# Patient Record
Sex: Male | Born: 1935 | Race: White | Hispanic: No | Smoking: Former smoker
Health system: Southern US, Community
[De-identification: ages and names within clinical notes are randomized; demographics above are authoritative.]

## PROBLEM LIST (undated history)

## (undated) DIAGNOSIS — IMO0001 Reserved for inherently not codable concepts without codable children: Secondary | ICD-10-CM

## (undated) DIAGNOSIS — F419 Anxiety disorder, unspecified: Secondary | ICD-10-CM

## (undated) DIAGNOSIS — C3490 Malignant neoplasm of unspecified part of unspecified bronchus or lung: Secondary | ICD-10-CM

## (undated) DIAGNOSIS — R609 Edema, unspecified: Secondary | ICD-10-CM

## (undated) DIAGNOSIS — G8929 Other chronic pain: Secondary | ICD-10-CM

## (undated) DIAGNOSIS — F32A Depression, unspecified: Secondary | ICD-10-CM

## (undated) DIAGNOSIS — J449 Chronic obstructive pulmonary disease, unspecified: Secondary | ICD-10-CM

## (undated) DIAGNOSIS — G250 Essential tremor: Secondary | ICD-10-CM

## (undated) DIAGNOSIS — J8410 Pulmonary fibrosis, unspecified: Secondary | ICD-10-CM

## (undated) DIAGNOSIS — C61 Malignant neoplasm of prostate: Secondary | ICD-10-CM

## (undated) DIAGNOSIS — E119 Type 2 diabetes mellitus without complications: Secondary | ICD-10-CM

## (undated) DIAGNOSIS — E785 Hyperlipidemia, unspecified: Secondary | ICD-10-CM

## (undated) DIAGNOSIS — D126 Benign neoplasm of colon, unspecified: Secondary | ICD-10-CM

## (undated) DIAGNOSIS — N200 Calculus of kidney: Secondary | ICD-10-CM

## (undated) DIAGNOSIS — N35919 Unspecified urethral stricture, male, unspecified site: Secondary | ICD-10-CM

## (undated) DIAGNOSIS — Z923 Personal history of irradiation: Secondary | ICD-10-CM

## (undated) DIAGNOSIS — F329 Major depressive disorder, single episode, unspecified: Secondary | ICD-10-CM

## (undated) DIAGNOSIS — IMO0002 Reserved for concepts with insufficient information to code with codable children: Secondary | ICD-10-CM

## (undated) DIAGNOSIS — L03115 Cellulitis of right lower limb: Secondary | ICD-10-CM

## (undated) DIAGNOSIS — M25512 Pain in left shoulder: Secondary | ICD-10-CM

## (undated) DIAGNOSIS — K5790 Diverticulosis of intestine, part unspecified, without perforation or abscess without bleeding: Secondary | ICD-10-CM

## (undated) DIAGNOSIS — D649 Anemia, unspecified: Secondary | ICD-10-CM

## (undated) DIAGNOSIS — K219 Gastro-esophageal reflux disease without esophagitis: Secondary | ICD-10-CM

## (undated) DIAGNOSIS — K5909 Other constipation: Secondary | ICD-10-CM

## (undated) DIAGNOSIS — K225 Diverticulum of esophagus, acquired: Secondary | ICD-10-CM

## (undated) HISTORY — DX: Edema, unspecified: R60.9

## (undated) HISTORY — DX: Diverticulum of esophagus, acquired: K22.5

## (undated) HISTORY — PX: CATARACT EXTRACTION, BILATERAL: SHX1313

## (undated) HISTORY — DX: Depression, unspecified: F32.A

## (undated) HISTORY — DX: Anxiety disorder, unspecified: F41.9

## (undated) HISTORY — DX: Malignant neoplasm of unspecified part of unspecified bronchus or lung: C34.90

## (undated) HISTORY — DX: Hyperlipidemia, unspecified: E78.5

## (undated) HISTORY — DX: Calculus of kidney: N20.0

## (undated) HISTORY — DX: Cellulitis of right lower limb: L03.115

## (undated) HISTORY — PX: APPENDECTOMY: SHX54

## (undated) HISTORY — DX: Other chronic pain: G89.29

## (undated) HISTORY — DX: Personal history of irradiation: Z92.3

## (undated) HISTORY — DX: Type 2 diabetes mellitus without complications: E11.9

## (undated) HISTORY — DX: Other constipation: K59.09

## (undated) HISTORY — DX: Essential tremor: G25.0

## (undated) HISTORY — DX: Malignant neoplasm of prostate: C61

## (undated) HISTORY — DX: Unspecified urethral stricture, male, unspecified site: N35.919

## (undated) HISTORY — DX: Anemia, unspecified: D64.9

## (undated) HISTORY — DX: Major depressive disorder, single episode, unspecified: F32.9

## (undated) HISTORY — DX: Pulmonary fibrosis, unspecified: J84.10

## (undated) HISTORY — DX: Benign neoplasm of colon, unspecified: D12.6

## (undated) HISTORY — DX: Pain in left shoulder: M25.512

## (undated) HISTORY — DX: Chronic obstructive pulmonary disease, unspecified: J44.9

## (undated) HISTORY — DX: Diverticulosis of intestine, part unspecified, without perforation or abscess without bleeding: K57.90

## (undated) HISTORY — DX: Gastro-esophageal reflux disease without esophagitis: K21.9

---
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
## 2003-08-29 DIAGNOSIS — C61 Malignant neoplasm of prostate: Secondary | ICD-10-CM

## 2003-08-29 HISTORY — PX: OTHER SURGICAL HISTORY: SHX169

## 2003-08-29 HISTORY — DX: Malignant neoplasm of prostate: C61

## 2005-02-23 ENCOUNTER — Encounter: Admission: RE | Admit: 2005-02-23 | Discharge: 2005-02-23 | Payer: Self-pay | Admitting: Urology

## 2005-03-02 ENCOUNTER — Ambulatory Visit (HOSPITAL_COMMUNITY): Admission: RE | Admit: 2005-03-02 | Discharge: 2005-03-02 | Payer: Self-pay | Admitting: Urology

## 2005-03-02 ENCOUNTER — Ambulatory Visit (HOSPITAL_BASED_OUTPATIENT_CLINIC_OR_DEPARTMENT_OTHER): Admission: RE | Admit: 2005-03-02 | Discharge: 2005-03-02 | Payer: Self-pay | Admitting: Urology

## 2006-03-09 ENCOUNTER — Emergency Department (HOSPITAL_COMMUNITY): Admission: EM | Admit: 2006-03-09 | Discharge: 2006-03-09 | Payer: Self-pay | Admitting: Emergency Medicine

## 2006-03-10 ENCOUNTER — Emergency Department (HOSPITAL_COMMUNITY): Admission: EM | Admit: 2006-03-10 | Discharge: 2006-03-10 | Payer: Self-pay | Admitting: Emergency Medicine

## 2006-09-10 ENCOUNTER — Emergency Department (HOSPITAL_COMMUNITY): Admission: EM | Admit: 2006-09-10 | Discharge: 2006-09-10 | Payer: Self-pay | Admitting: Emergency Medicine

## 2006-09-11 ENCOUNTER — Emergency Department (HOSPITAL_COMMUNITY): Admission: EM | Admit: 2006-09-11 | Discharge: 2006-09-11 | Payer: Self-pay | Admitting: Emergency Medicine

## 2006-11-19 ENCOUNTER — Inpatient Hospital Stay (HOSPITAL_COMMUNITY): Admission: EM | Admit: 2006-11-19 | Discharge: 2006-11-23 | Payer: Self-pay | Admitting: Emergency Medicine

## 2006-12-21 ENCOUNTER — Inpatient Hospital Stay (HOSPITAL_COMMUNITY): Admission: EM | Admit: 2006-12-21 | Discharge: 2006-12-26 | Payer: Self-pay | Admitting: Emergency Medicine

## 2007-01-03 ENCOUNTER — Encounter: Payer: Self-pay | Admitting: Emergency Medicine

## 2007-01-04 ENCOUNTER — Inpatient Hospital Stay (HOSPITAL_COMMUNITY): Admission: EM | Admit: 2007-01-04 | Discharge: 2007-01-06 | Payer: Self-pay | Admitting: Internal Medicine

## 2007-02-24 ENCOUNTER — Emergency Department (HOSPITAL_COMMUNITY): Admission: EM | Admit: 2007-02-24 | Discharge: 2007-02-24 | Payer: Self-pay | Admitting: Emergency Medicine

## 2007-02-25 ENCOUNTER — Emergency Department (HOSPITAL_COMMUNITY): Admission: EM | Admit: 2007-02-25 | Discharge: 2007-02-25 | Payer: Self-pay | Admitting: Emergency Medicine

## 2007-03-09 ENCOUNTER — Emergency Department (HOSPITAL_COMMUNITY): Admission: EM | Admit: 2007-03-09 | Discharge: 2007-03-09 | Payer: Self-pay | Admitting: Emergency Medicine

## 2007-05-29 HISTORY — PX: COLONOSCOPY: SHX174

## 2007-06-12 ENCOUNTER — Encounter: Admission: RE | Admit: 2007-06-12 | Discharge: 2007-07-15 | Payer: Self-pay | Admitting: Family Medicine

## 2007-11-12 ENCOUNTER — Encounter (INDEPENDENT_AMBULATORY_CARE_PROVIDER_SITE_OTHER): Payer: Self-pay | Admitting: Urology

## 2007-11-12 ENCOUNTER — Ambulatory Visit (HOSPITAL_BASED_OUTPATIENT_CLINIC_OR_DEPARTMENT_OTHER): Admission: RE | Admit: 2007-11-12 | Discharge: 2007-11-12 | Payer: Self-pay | Admitting: Urology

## 2007-11-25 ENCOUNTER — Emergency Department (HOSPITAL_COMMUNITY): Admission: EM | Admit: 2007-11-25 | Discharge: 2007-11-26 | Payer: Self-pay | Admitting: Emergency Medicine

## 2007-11-26 ENCOUNTER — Emergency Department (HOSPITAL_COMMUNITY): Admission: EM | Admit: 2007-11-26 | Discharge: 2007-11-26 | Payer: Self-pay | Admitting: Emergency Medicine

## 2007-12-18 ENCOUNTER — Emergency Department (HOSPITAL_COMMUNITY): Admission: EM | Admit: 2007-12-18 | Discharge: 2007-12-18 | Payer: Self-pay | Admitting: Emergency Medicine

## 2008-03-24 ENCOUNTER — Ambulatory Visit (HOSPITAL_BASED_OUTPATIENT_CLINIC_OR_DEPARTMENT_OTHER): Admission: RE | Admit: 2008-03-24 | Discharge: 2008-03-24 | Payer: Self-pay | Admitting: Urology

## 2008-06-30 ENCOUNTER — Ambulatory Visit (HOSPITAL_BASED_OUTPATIENT_CLINIC_OR_DEPARTMENT_OTHER): Admission: RE | Admit: 2008-06-30 | Discharge: 2008-06-30 | Payer: Self-pay | Admitting: Urology

## 2008-11-08 ENCOUNTER — Emergency Department (HOSPITAL_COMMUNITY): Admission: EM | Admit: 2008-11-08 | Discharge: 2008-11-08 | Payer: Self-pay | Admitting: Emergency Medicine

## 2008-11-12 ENCOUNTER — Ambulatory Visit (HOSPITAL_COMMUNITY): Admission: RE | Admit: 2008-11-12 | Discharge: 2008-11-12 | Payer: Self-pay | Admitting: Urology

## 2008-12-09 ENCOUNTER — Ambulatory Visit (HOSPITAL_BASED_OUTPATIENT_CLINIC_OR_DEPARTMENT_OTHER): Admission: RE | Admit: 2008-12-09 | Discharge: 2008-12-09 | Payer: Self-pay | Admitting: Urology

## 2008-12-09 HISTORY — PX: INTERNAL URETHROTOMY: SHX1835

## 2008-12-16 ENCOUNTER — Ambulatory Visit (HOSPITAL_COMMUNITY): Admission: RE | Admit: 2008-12-16 | Discharge: 2008-12-16 | Payer: Self-pay | Admitting: Thoracic Surgery

## 2008-12-17 ENCOUNTER — Ambulatory Visit: Payer: Self-pay | Admitting: Emergency Medicine

## 2008-12-17 ENCOUNTER — Ambulatory Visit: Payer: Self-pay | Admitting: Thoracic Surgery

## 2008-12-17 DIAGNOSIS — J8410 Pulmonary fibrosis, unspecified: Secondary | ICD-10-CM

## 2008-12-17 DIAGNOSIS — J449 Chronic obstructive pulmonary disease, unspecified: Secondary | ICD-10-CM

## 2008-12-17 DIAGNOSIS — C3490 Malignant neoplasm of unspecified part of unspecified bronchus or lung: Secondary | ICD-10-CM | POA: Insufficient documentation

## 2008-12-17 DIAGNOSIS — J4489 Other specified chronic obstructive pulmonary disease: Secondary | ICD-10-CM | POA: Insufficient documentation

## 2008-12-23 ENCOUNTER — Ambulatory Visit (HOSPITAL_COMMUNITY): Admission: RE | Admit: 2008-12-23 | Discharge: 2008-12-23 | Payer: Self-pay | Admitting: Thoracic Surgery

## 2008-12-23 ENCOUNTER — Encounter: Payer: Self-pay | Admitting: Thoracic Surgery

## 2008-12-23 ENCOUNTER — Encounter (INDEPENDENT_AMBULATORY_CARE_PROVIDER_SITE_OTHER): Payer: Self-pay | Admitting: Diagnostic Radiology

## 2008-12-28 ENCOUNTER — Ambulatory Visit (HOSPITAL_COMMUNITY): Admission: RE | Admit: 2008-12-28 | Discharge: 2008-12-28 | Payer: Self-pay | Admitting: Emergency Medicine

## 2008-12-28 ENCOUNTER — Ambulatory Visit: Payer: Self-pay | Admitting: Emergency Medicine

## 2008-12-30 ENCOUNTER — Ambulatory Visit: Payer: Self-pay | Admitting: Internal Medicine

## 2008-12-30 ENCOUNTER — Ambulatory Visit: Payer: Self-pay | Admitting: Thoracic Surgery

## 2008-12-30 ENCOUNTER — Encounter: Payer: Self-pay | Admitting: Emergency Medicine

## 2008-12-31 ENCOUNTER — Encounter: Admission: RE | Admit: 2008-12-31 | Discharge: 2008-12-31 | Payer: Self-pay | Admitting: Family Medicine

## 2008-12-31 ENCOUNTER — Ambulatory Visit: Payer: Self-pay | Admitting: Emergency Medicine

## 2009-01-01 ENCOUNTER — Telehealth: Payer: Self-pay | Admitting: Emergency Medicine

## 2009-01-05 ENCOUNTER — Ambulatory Visit: Admission: RE | Admit: 2009-01-05 | Discharge: 2009-03-07 | Payer: Self-pay | Admitting: Radiation Oncology

## 2009-01-11 ENCOUNTER — Encounter: Payer: Self-pay | Admitting: Family Medicine

## 2009-03-19 ENCOUNTER — Encounter: Payer: Self-pay | Admitting: Emergency Medicine

## 2009-03-21 ENCOUNTER — Emergency Department (HOSPITAL_COMMUNITY): Admission: EM | Admit: 2009-03-21 | Discharge: 2009-03-21 | Payer: Self-pay | Admitting: Emergency Medicine

## 2009-03-31 ENCOUNTER — Ambulatory Visit (HOSPITAL_COMMUNITY): Admission: RE | Admit: 2009-03-31 | Discharge: 2009-03-31 | Payer: Self-pay | Admitting: Radiation Oncology

## 2009-04-07 ENCOUNTER — Encounter: Payer: Self-pay | Admitting: Emergency Medicine

## 2009-05-30 ENCOUNTER — Emergency Department (HOSPITAL_COMMUNITY): Admission: EM | Admit: 2009-05-30 | Discharge: 2009-05-30 | Payer: Self-pay | Admitting: Emergency Medicine

## 2009-07-09 ENCOUNTER — Ambulatory Visit (HOSPITAL_COMMUNITY): Admission: RE | Admit: 2009-07-09 | Discharge: 2009-07-09 | Payer: Self-pay | Admitting: Radiation Oncology

## 2009-10-27 ENCOUNTER — Ambulatory Visit: Payer: Self-pay | Admitting: Emergency Medicine

## 2009-11-02 ENCOUNTER — Encounter: Payer: Self-pay | Admitting: Emergency Medicine

## 2009-11-02 ENCOUNTER — Ambulatory Visit (HOSPITAL_COMMUNITY): Admission: RE | Admit: 2009-11-02 | Discharge: 2009-11-02 | Payer: Self-pay | Admitting: Emergency Medicine

## 2009-11-16 ENCOUNTER — Ambulatory Visit (HOSPITAL_COMMUNITY): Admission: RE | Admit: 2009-11-16 | Discharge: 2009-11-16 | Payer: Self-pay | Admitting: Urology

## 2009-11-30 ENCOUNTER — Ambulatory Visit: Payer: Self-pay | Admitting: Emergency Medicine

## 2009-12-11 ENCOUNTER — Emergency Department (HOSPITAL_COMMUNITY): Admission: EM | Admit: 2009-12-11 | Discharge: 2009-12-11 | Payer: Self-pay | Admitting: Emergency Medicine

## 2009-12-29 ENCOUNTER — Ambulatory Visit: Payer: Self-pay | Admitting: Vascular Surgery

## 2010-01-17 ENCOUNTER — Ambulatory Visit (HOSPITAL_COMMUNITY): Admission: RE | Admit: 2010-01-17 | Discharge: 2010-01-17 | Payer: Self-pay | Admitting: Radiation Oncology

## 2010-02-16 ENCOUNTER — Ambulatory Visit: Payer: Self-pay | Admitting: Emergency Medicine

## 2010-02-16 ENCOUNTER — Telehealth: Payer: Self-pay | Admitting: Emergency Medicine

## 2010-04-20 ENCOUNTER — Inpatient Hospital Stay (HOSPITAL_COMMUNITY): Admission: EM | Admit: 2010-04-20 | Discharge: 2010-04-26 | Payer: Self-pay | Admitting: Emergency Medicine

## 2010-04-21 ENCOUNTER — Ambulatory Visit: Payer: Self-pay | Admitting: Vascular Surgery

## 2010-04-21 ENCOUNTER — Encounter (INDEPENDENT_AMBULATORY_CARE_PROVIDER_SITE_OTHER): Payer: Self-pay | Admitting: Internal Medicine

## 2010-05-31 ENCOUNTER — Emergency Department (HOSPITAL_COMMUNITY): Admission: EM | Admit: 2010-05-31 | Discharge: 2010-05-31 | Payer: Self-pay | Admitting: Emergency Medicine

## 2010-06-28 ENCOUNTER — Ambulatory Visit: Payer: Self-pay | Admitting: Emergency Medicine

## 2010-07-25 ENCOUNTER — Ambulatory Visit (HOSPITAL_COMMUNITY)
Admission: RE | Admit: 2010-07-25 | Discharge: 2010-07-25 | Payer: Self-pay | Source: Home / Self Care | Admitting: Radiation Oncology

## 2010-08-01 ENCOUNTER — Emergency Department (HOSPITAL_COMMUNITY)
Admission: EM | Admit: 2010-08-01 | Discharge: 2010-08-01 | Payer: Self-pay | Source: Home / Self Care | Admitting: Emergency Medicine

## 2010-09-16 ENCOUNTER — Other Ambulatory Visit: Payer: Self-pay | Admitting: Radiation Oncology

## 2010-09-16 DIAGNOSIS — C3490 Malignant neoplasm of unspecified part of unspecified bronchus or lung: Secondary | ICD-10-CM

## 2010-09-27 NOTE — Miscellaneous (Signed)
Summary: Orders Update   Clinical Lists Changes  Orders: Added new Service order of Est. Patient Level IV (99214) - Signed 

## 2010-09-27 NOTE — Assessment & Plan Note (Signed)
Summary: ILD, COPD   Visit Type:  Follow-up Copy to:  Crecencio Mc Primary Janijah Symons/Referring Shelli Portilla:  Johnella Moloney  CC:  6 month followup COPD and ILD.  Pt states that he feels that his breathing has improved since last seen.  Denies any cough or wheeze.  Marland Kitchen  History of Present Illness: 77 yo former smoker with hx COPD, prostatic CA s/p resection 2005, ureteral strictures. Evaluated in ED 3/10 for abd pain, suspected from full bladder, some tremor and dysarthria. Underwent MRI abdomen that revealed RLL mass. Subsequent PET/CT also shows subpleural ILD. Has albuterol, rarely requires. uses Spiriva daily.   12/31/08 -- Pt returns to [facility] today s/p needle bx of his RUL mass. The bx shows non-small cell lung CA, with features suggestive of adenoCA.  Has completed swallowing eval for his ILD w/u.   ROV 10/27/09 -- Returns today to tell me that he is having more trouble swalowing. Food sems to go down OK, but he has consistent coughing with thin liquids. His speech Rx eval in May 10 recommended thin liquids Completed radiation in June 10. Followed by Dr Kathrynn Running with serial scans, next one to be in May 11. Wt stable. Some stable exertional SOB. No exacerbations of COPD in last year.   ROV 11/30/09 -- follows up to discuss swallow eval results. Tells me today he has been more fatigued, much more tired since last visit. His diet has "gone to hell" since his swallowing eval showed that he needs  nectar thick liquids, has silent aspiration of large bolused solids.   ROV 02/16/10 -- returns for f/u of his ILD from aspiration, COPD, adenoCA s/p XRT. Since last visit has had CT scan in 5/11 that showed some scarring in the region of his lung mass. Last time we changed back to his usual diet because the modified diet was intolerable. His wt is up some 5 lbs. Still trying to use thickener in his drinks.   ROV 06/29/10 -- hx chronic aspiration and ILD, adenoCA s/p XRT. Since last visit he has several falls, still lives  alone. Tells me that he is feeling OK, but the thickened diet is making him constipated. Miralax was started about 4 days ago. Was just in [place] for rehab after discharged for a fall. No aspiration symptoms, no cough with taking by mouth as long as he thinckens. Spiriva was substituted with atrovent briefly, now back on Spiriva - missed it.   Current Medications (verified): 1)  Clonazepam 2 Mg Tabs (Clonazepam) .Marland Kitchen.. 1 By Mouth Two Times A Day 2)  Atrovent Hfa 17 Mcg/act Aers (Ipratropium Bromide Hfa) .... 2 Puffs Two Times A Day 3)  Singulair 10 Mg Tabs (Montelukast Sodium) .Marland Kitchen.. 1 By Mouth Once Daily 4)  Sanctura Xr 60 Mg Xr24h-Cap (Trospium Chloride) .Marland Kitchen.. 1 By Mouth Once Daily 5)  Nasacort Aq 55 Mcg/act Aers (Triamcinolone Acetonide(Nasal)) .... 2 Sprays Each Nostril Once Daily 6)  Tricor 145 Mg Tabs (Fenofibrate) .... Once Daily 7)  Fish Oil 1000 Mg Caps (Omega-3 Fatty Acids) .... 3 Capsules Once Daily 8)  Vitamin D3 1000 Unit Caps (Cholecalciferol) .... 4 Capsules Once Daily 9)  Aspirin 81 Mg Tbec (Aspirin) .Marland Kitchen.. 1 Once Daily 10)  Miralax  Powd (Polyethylene Glycol 3350) .Marland Kitchen.. 1 Scopp in 8 Oz Water Daily As Needed  Allergies (verified): 1)  ! Iodine 2)  ! Sulfa  Vital Signs:  Patient profile:   77 year old male Weight:      149 pounds O2 Sat:  93 % on Room air Temp:     97.9 degrees F oral Pulse rate:   90 / minute BP sitting:   100 / 60  (left arm)  Vitals Entered By: Vernie Murders (June 28, 2010 2:17 PM)  O2 Flow:  Room air  Physical Exam  General:  elderly man, somewhat slow to respond but appropriate, significant tremor, makes it difficult to understand his speech Nose:  no deformity, discharge, inflammation, or lesions Mouth:  dysarthria Chest Wall:  no deformities noted Lungs:  bilateral insp crackles, no whezes Heart:  regular rate and rhythm, S1, S2 without murmurs, rubs, gallops, or clicks Abdomen:  not examined Msk:  no deformity or scoliosis noted with  normal posture Extremities:  no edema Neurologic:  dysarthria, tremor Psych:  alert and cooperative; normal mood and affect; normal attention span and concentration   Impression & Recommendations:  Problem # 1:  INTERSTITIAL LUNG DISEASE (ICD-515) With chronic aspiration. Needs to thicken diet but getting constipation - Use miralax once daily  - ducolax as needed  - thickener  Problem # 2:  COPD (ICD-496) - continue Spiriva.   Medications Added to Medication List This Visit: 1)  Atrovent Hfa 17 Mcg/act Aers (Ipratropium bromide hfa) .... 2 puffs two times a day 2)  Aspirin 81 Mg Tbec (Aspirin) .Marland Kitchen.. 1 once daily 3)  Miralax Powd (Polyethylene glycol 3350) .Marland Kitchen.. 1 scopp in 8 oz water daily as needed  Other Orders: Est. Patient Level IV (16109)  Patient Instructions: 1)  Continue your Spiriva once daily  2)  Continue to thicken your liquids 3)  Use the miralax once to twice a day and ducolax as needed for your constipation 4)  Follow up with Dr Delton Coombes in 6 months with a CXR

## 2010-09-27 NOTE — Assessment & Plan Note (Signed)
Summary: ILD, aspiration   Visit Type:  Follow-up Copy to:  Crecencio Mc  CC:  ILD. COPD. Follow-up on Swallow eval.  The patient says his breathing is doing well. He does c/o cough with occasional mucus prod. and says he has a hard time swallowing.Marland Kitchen  History of Present Illness: 77 yo former smoker with hx COPD, prostatic CA s/p resection 2005, ureteral strictures. Evaluated in ED 3/10 for abd pain, suspected from full bladder, some tremor and dysarthria. Underwent MRI abdomen that revealed RLL mass. Subsequent PET/CT also shows subpleural ILD. Has albuterol, rarely requires. uses Spiriva daily.   12/31/08 -- Pt returns to [facility] today s/p needle bx of his RUL mass. The bx shows non-small cell lung CA, with features suggestive of adenoCA.  Has completed swallowing eval for his ILD w/u.   ROV 10/27/09 -- Returns today to tell me that he is having more trouble swalowing. Food sems to go down OK, but he has consistent coughing with thin liquids. His speech Rx eval in May 10 recommended thin liquids Completed radiation in June 10. Followed by Dr Kathrynn Running with serial scans, next one to be in May 11. Wt stable. Some stable exertional SOB. No exacerbations of COPD in last year.   ROV 11/30/09 -- follows up to discuss swallow eval results. Tells me today he has been more fatigued, much more tired since last visit. His diet has "gone to hell" since his swallowing eval showed that he needs  nectar thick liquids, has silent aspiration of large bolused solids.  b Current Medications (verified): 1)  Clonazepam 2 Mg Tabs (Clonazepam) .Marland Kitchen.. 1 By Mouth Two Times A Day 2)  Spiriva Handihaler 18 Mcg Caps (Tiotropium Bromide Monohydrate) .Marland Kitchen.. 1 Inhaled Once Daily 3)  Singulair 10 Mg Tabs (Montelukast Sodium) .Marland Kitchen.. 1 By Mouth Once Daily 4)  Sanctura Xr 60 Mg Xr24h-Cap (Trospium Chloride) .Marland Kitchen.. 1 By Mouth Once Daily 5)  Ventolin Hfa 108 (90 Base) Mcg/act Aers (Albuterol Sulfate) .... 2 Puffs Q4h As Needed 6)  Nasacort Aq 55  Mcg/act Aers (Triamcinolone Acetonide(Nasal)) .... 2 Sprays Each Nostril Once Daily 7)  Cvs Loratadine 10 Mg Tabs (Loratadine) .Marland Kitchen.. 1 By Mouth Once Daily 8)  Tricor 145 Mg Tabs (Fenofibrate) .... Once Daily 9)  Oxygen .... 2l At Bedtime 10)  Fish Oil 1000 Mg Caps (Omega-3 Fatty Acids) .... 3 Capsules Once Daily 11)  Vitamin D3 1000 Unit Caps (Cholecalciferol) .... 4 Capsules Once Daily  Allergies (verified): 1)  ! Iodine 2)  ! Sulfa  Vital Signs:  Patient profile:   77 year old male Height:      66 inches (167.64 cm) Weight:      156 pounds (70.91 kg) O2 Sat:      90 % on Room air Temp:     97.8 degrees F (36.56 degrees C) oral Pulse rate:   99 / minute BP sitting:   100 / 58  (left arm) Cuff size:   regular  Vitals Entered By: Michel Bickers CMA (November 30, 2009 4:16 PM)  O2 Sat at Rest %:  90 O2 Flow:  Room air  Physical Exam  General:  elderly man, somewhat slow to respond but appropriate,  Nose:  no deformity, discharge, inflammation, or lesions Mouth:  dysarthria Chest Wall:  no deformities noted Lungs:  bilateral insp crackles, no whezes Heart:  regular rate and rhythm, S1, S2 without murmurs, rubs, gallops, or clicks Abdomen:  not examined Msk:  no deformity or scoliosis noted with  normal posture Extremities:  no edema Neurologic:  dysarthria, tremor Psych:  alert and cooperative; normal mood and affect; normal attention span and concentration   Impression & Recommendations:  Problem # 1:  INTERSTITIAL LUNG DISEASE (ICD-515) He does have aspiration that has worsened by MBS, but after trying to modify his diet he has lost wt, is weak, feels worse.   Problem # 2:  COPD (ICD-496) Same BDs  Patient Instructions: 1)  Continue to use thickener with your liquids.  2)  Go back to your usual diet otherwise.  3)  Continue your inhaled medications as ordered.  4)  Follow with Dr Delton Coombes in 3 months or as needed

## 2010-09-27 NOTE — Progress Notes (Signed)
Summary: ov notes  Phone Note Call from Patient Call back at Home Phone [phone]   Caller: Patient Call For: Heywood Tokunaga Summary of Call: pt was just seen by dr Adaia Matthies. pt requests to have ov notes faxed to dr Victorino Dike stone fax# [phone].  Initial call taken by: Tivis Ringer, CNA,  February 16, 2010 2:49 PM  Follow-up for Phone Call        lmom  WITH PT to make him aware that   OV NOTES HAVE BEEN FAXED TO DR. Victorino Dike STONE PER PTS REQUEST. Randell Loop CMA  February 16, 2010 2:56 PM

## 2010-09-27 NOTE — Assessment & Plan Note (Signed)
Summary: ILD, aspiration, COPD, adenoCA   Visit Type:  Follow-up Copy to:  Crecencio Mc  CC:  ILD.  The patient says his breathing is doing well. No complaints today.Marland Kitchen  History of Present Illness: 77 yo former smoker with hx COPD, prostatic CA s/p resection 2005, ureteral strictures. Evaluated in ED 3/10 for abd pain, suspected from full bladder, some tremor and dysarthria. Underwent MRI abdomen that revealed RLL mass. Subsequent PET/CT also shows subpleural ILD. Has albuterol, rarely requires. uses Spiriva daily.   12/31/08 -- Pt returns to [facility] today s/p needle bx of his RUL mass. The bx shows non-small cell lung CA, with features suggestive of adenoCA.  Has completed swallowing eval for his ILD w/u.   ROV 10/27/09 -- Returns today to tell me that he is having more trouble swalowing. Food sems to go down OK, but he has consistent coughing with thin liquids. His speech Rx eval in May 10 recommended thin liquids Completed radiation in June 10. Followed by Dr Kathrynn Running with serial scans, next one to be in May 11. Wt stable. Some stable exertional SOB. No exacerbations of COPD in last year.   ROV 11/30/09 -- follows up to discuss swallow eval results. Tells me today he has been more fatigued, much more tired since last visit. His diet has "gone to hell" since his swallowing eval showed that he needs  nectar thick liquids, has silent aspiration of large bolused solids.   ROV 02/16/10 -- returns for f/u of his ILD from aspiration, COPD, adenoCA s/p XRT. Since last visit has had CT scan in 5/11 that showed some scarring in the region of his lung mass. Last time we changed back to his usual diet because the modified diet was intolerable. His wt is up some 5 lbs. Still trying to use thickener in his drinks.   Current Medications (verified): 1)  Clonazepam 2 Mg Tabs (Clonazepam) .Marland Kitchen.. 1 By Mouth Two Times A Day 2)  Spiriva Handihaler 18 Mcg Caps (Tiotropium Bromide Monohydrate) .Marland Kitchen.. 1 Inhaled Once Daily 3)   Singulair 10 Mg Tabs (Montelukast Sodium) .Marland Kitchen.. 1 By Mouth Once Daily 4)  Sanctura Xr 60 Mg Xr24h-Cap (Trospium Chloride) .Marland Kitchen.. 1 By Mouth Once Daily 5)  Ventolin Hfa 108 (90 Base) Mcg/act Aers (Albuterol Sulfate) .... 2 Puffs Q4h As Needed 6)  Nasacort Aq 55 Mcg/act Aers (Triamcinolone Acetonide(Nasal)) .... 2 Sprays Each Nostril Once Daily 7)  Cvs Loratadine 10 Mg Tabs (Loratadine) .Marland Kitchen.. 1 By Mouth Once Daily 8)  Tricor 145 Mg Tabs (Fenofibrate) .... Once Daily 9)  Oxygen .... 2l At Bedtime 10)  Fish Oil 1000 Mg Caps (Omega-3 Fatty Acids) .... 3 Capsules Once Daily 11)  Vitamin D3 1000 Unit Caps (Cholecalciferol) .... 4 Capsules Once Daily  Allergies (verified): 1)  ! Iodine 2)  ! Sulfa  Vital Signs:  Patient profile:   77 year old male Height:      66.5 inches (168.91 cm) Weight:      156 pounds (70.91 kg) BMI:     24.89 O2 Sat:      92 % on Room air Temp:     97.8 degrees F (36.56 degrees C) oral Pulse rate:   100 / minute BP sitting:   112 / 74  (left arm) Cuff size:   regular  Vitals Entered By: Michel Bickers CMA (February 16, 2010 2:06 PM)  O2 Sat at Rest %:  92 O2 Flow:  Room air CC: ILD.  The patient says his breathing is  doing well. No complaints today. Comments Medications reviewed. Michel Bickers CMA  February 16, 2010 2:07 PM   Physical Exam  General:  elderly man, somewhat slow to respond but appropriate,  Nose:  no deformity, discharge, inflammation, or lesions Mouth:  dysarthria Chest Wall:  no deformities noted Lungs:  bilateral insp crackles, no whezes Heart:  regular rate and rhythm, S1, S2 without murmurs, rubs, gallops, or clicks Abdomen:  not examined Msk:  no deformity or scoliosis noted with normal posture Extremities:  no edema Neurologic:  dysarthria, tremor Psych:  alert and cooperative; normal mood and affect; normal attention span and concentration   Other Orders: Prescription Created Electronically [id number]) Est. Patient Level III (60454)  Patient  Instructions: 1)  Please continue to use thickener and swallowing precautions.  2)  Use Spiriva once daily  3)  Use your oxygen at night 4)  Continue to follow with Dr Kathrynn Running as planned 5)  Follow up with Dr Delton Coombes in 6 months or as needed for any problems.  Prescriptions: VENTOLIN HFA 108 (90 BASE) MCG/ACT AERS (ALBUTEROL SULFATE) 2 puffs q4h as needed  #1 x 11   Entered and Authorized by:   Leslye Peer MD   Signed by:   Leslye Peer MD on 02/16/2010   Method used:   Electronically to        [facility] [phone]* (retail)       [address]       [place], [place]  [address]       Ph: [phone] or [phone]       Fax: [phone]   RxID:   [id number]

## 2010-09-27 NOTE — Assessment & Plan Note (Signed)
Summary: ILD, COPD, NSCLCA   Visit Type:  Follow-up Copy to:  Crecencio Mc  CC:  Acute Visit.  states since Radiation treatments he is having trouble swollowing. drinking liquids produce a productive cough with "clear liquid" mucus.  also states he believes "lung capasity has decreased."  states these symptoms began after completing radation.Marland Kitchen  History of Present Illness: 77 yo former smoker with hx COPD, prostatic CA s/p resection 2005, ureteral strictures. Evaluated in ED 3/10 for abd pain, suspected from full bladder, some tremor and dysarthria. Underwent MRI abdomen that revealed RLL mass. Subsequent PET/CT also shows subpleural ILD. Has albuterol, rarely requires. uses Spiriva daily.   12/31/08 -- Pt returns to [facility] today s/p needle bx of his RUL mass. The bx shows non-small cell lung CA, with features suggestive of adenoCA.  Has completed swallowing eval for his ILD w/u.   ROV 10/27/09 -- Returns today to tell me that he is having more trouble swalowing. Food sems to go down OK, but he has consistent coughing with thin liquids. His speech Rx eval in May 10 recommended thin liquids Completed radiation in June 10. Followed by Dr Kathrynn Running with serial scans, next one to be in May 11. Wt stable. Some stable exertional SOB. No exacerbations of COPD in last year.   Current Medications (verified): 1)  Clonazepam 2 Mg Tabs (Clonazepam) .Marland Kitchen.. 1 By Mouth Two Times A Day 2)  Spiriva Handihaler 18 Mcg Caps (Tiotropium Bromide Monohydrate) .Marland Kitchen.. 1 Inhaled Once Daily 3)  Singulair 10 Mg Tabs (Montelukast Sodium) .Marland Kitchen.. 1 By Mouth Once Daily 4)  Sanctura Xr 60 Mg Xr24h-Cap (Trospium Chloride) .Marland Kitchen.. 1 By Mouth Once Daily 5)  Ventolin Hfa 108 (90 Base) Mcg/act Aers (Albuterol Sulfate) .... 2 Puffs Q4h As Needed 6)  Nasacort Aq 55 Mcg/act Aers (Triamcinolone Acetonide(Nasal)) .... 2 Sprays Each Nostril Once Daily 7)  Cvs Loratadine 10 Mg Tabs (Loratadine) .Marland Kitchen.. 1 By Mouth Once Daily 8)  Tricor 145 Mg Tabs (Fenofibrate)  .... Once Daily 9)  Oxygen .... 2l At Bedtime 10)  Fish Oil 1000 Mg Caps (Omega-3 Fatty Acids) .... 3 Capsules Once Daily 11)  Vitamin D3 1000 Unit Caps (Cholecalciferol) .... 4 Capsules Once Daily  Allergies: 1)  ! Iodine 2)  ! Sulfa  Vital Signs:  Patient profile:   77 year old male Height:      66 inches Weight:      160.25 pounds O2 Sat:      94 % on Room air Temp:     97.6 degrees F oral Pulse rate:   66 / minute BP sitting:   96 / 64  (left arm) Cuff size:   regular  Vitals Entered By: Gweneth Dimitri RN (October 27, 2009 11:53 AM)  O2 Flow:  Room air CC: Acute Visit.  states since Radiation treatments he is having trouble swollowing. drinking liquids produce a productive cough with "clear liquid" mucus.  also states he believes "lung capasity has decreased."  states these symptoms began after completing radation. Comments Medications reviewed with patient Daytime contact number verified with patient. Gweneth Dimitri RN  October 27, 2009 11:52 AM     Impression & Recommendations:  Problem # 1:  INTERSTITIAL LUNG DISEASE (ICD-515) Seems to be having worsening aspiration signs, especially thin liquids. - repeat MBS to see if we need to thicken liquids  Problem # 2:  COPD (ICD-496) - Spiriva as ordered  Problem # 3:  NEOPLASM, MALIGNANT, LUNG, NONSMALL CELL (ICD-162.9) XRT completed.  F/u planned with Dr Kathrynn Running.   Medications Added to Medication List This Visit: 1)  Tricor 145 Mg Tabs (Fenofibrate) .... Once daily 2)  Oxygen  .... 2l at bedtime 3)  Fish Oil 1000 Mg Caps (Omega-3 fatty acids) .... 3 capsules once daily 4)  Vitamin D3 1000 Unit Caps (Cholecalciferol) .... 4 capsules once daily  Other Orders: Est. Patient Level IV (56213) Speech Therapy (Speech Therapy)  Patient Instructions: 1)  Continue Spiriva once daily  2)  We will repeat your Swallowing Evaluation to see if you need to thicken your liquids before drinking them 3)  Follow up with Dr Delton Coombes in 1  month to review.    Immunization History:  Influenza Immunization History:    Influenza:  historical (07/28/2009)  Pneumovax Immunization History:    Pneumovax:  historical (07/28/2009)

## 2010-10-27 ENCOUNTER — Ambulatory Visit (HOSPITAL_BASED_OUTPATIENT_CLINIC_OR_DEPARTMENT_OTHER)
Admission: RE | Admit: 2010-10-27 | Discharge: 2010-10-27 | Disposition: A | Discharge status: Home / Self Care | Payer: Medicare Other | Source: Ambulatory Visit | Attending: Urology | Admitting: Urology

## 2010-10-27 DIAGNOSIS — Z79899 Other long term (current) drug therapy: Secondary | ICD-10-CM | POA: Insufficient documentation

## 2010-10-27 DIAGNOSIS — Z01812 Encounter for preprocedural laboratory examination: Secondary | ICD-10-CM | POA: Insufficient documentation

## 2010-10-27 DIAGNOSIS — N35919 Unspecified urethral stricture, male, unspecified site: Secondary | ICD-10-CM | POA: Insufficient documentation

## 2010-10-27 LAB — POCT I-STAT 4, (NA,K, GLUC, HGB,HCT)
Glucose, Bld: 87 mg/dL (ref 70–99)
HCT: 39 % (ref 39.0–52.0)
Hemoglobin: 13.3 g/dL (ref 13.0–17.0)
Potassium: 3.5 mEq/L (ref 3.5–5.1)

## 2010-11-07 LAB — BASIC METABOLIC PANEL
Calcium: 9.8 mg/dL (ref 8.4–10.5)
Creatinine, Ser: 1.06 mg/dL (ref 0.4–1.5)
GFR calc Af Amer: >60 mL/min (ref >60)

## 2010-11-07 LAB — CBC
Platelets: 125 10*3/uL — ABNORMAL LOW (ref 150–400)
RBC: 4.14 MIL/uL — ABNORMAL LOW (ref 4.22–5.81)
WBC: 5.3 10*3/uL (ref 4.0–10.5)

## 2010-11-07 LAB — DIFFERENTIAL
Basophils Absolute: 0 10*3/uL (ref 0.0–0.1)
Eosinophils Absolute: 0.2 10*3/uL (ref 0.0–0.7)
Lymphocytes Relative: 13 % (ref 12–46)
Lymphs Abs: 0.7 10*3/uL (ref 0.7–4.0)
Neutrophils Relative %: 76 % (ref 43–77)

## 2010-11-10 NOTE — Op Note (Addendum)
  NAME:  [name], Roy [name]                 ACCOUNT NO.:  [id number]  MEDICAL RECORD NO.:  [id number]           PATIENT TYPE:  LOCATION:  ED                           FACILITY[facility]  [facility]  PHYSICIAN:  Martina Sinner, MD DATE OF BIRTH:  May 16, 1921  DATE OF PROCEDURE:  10/27/2010 DATE OF DISCHARGE:                              OPERATIVE REPORT   PREOPERATIVE DIAGNOSIS:  Urethral stricture.  POSTOPERATIVE DIAGNOSIS:  Urethral stricture.  PROCEDURE:  Cystoscopy, balloon dilation.  INDICATIONS FOR PROCEDURE:  Mr. Perlmutter has a chronic recurrent urethral stricture.  PROCEDURE IN DETAIL:  The patient was prepped and draped in the usual fashion.  Preoperative laboratory tests were within normal limits. Ciprofloxacin was given.  A 17-French scope was utilized.  The penile urethra was normal.  In the proximal bulbar urethra he had an 8-French short stricture.  Under cystoscopic guidance I passed a sensor wire up, curling nicely in the bladder.  I passed the balloon dilation catheter over the wire and used its marker to set its position.  I dilated for 3 minutes with 18 atmospheres of pressure.  I removed the balloon.  There was no bleeding. I re-cystoscoped the patient.  The stricture was nicely dilated in the proximal urethra.  There was little to no bleeding.  Bladder mucosa and trigone were within normal limits.  There was no erythema or evidence of cystitis.  Foley catheter was placed to tamponade any urethral bleeding and will be taken out in the recovery room.  The patient will be followed as per protocol.  I will dilate him every month instead of every 6 weeks.          ______________________________ Martina Sinner, MD     SAM/MEDQ  D:  10/27/2010  T:  10/27/2010  Job:  [id number]  Electronically Signed by Alfredo Martinez MD on 01/23/2011 06:16:48 PM

## 2010-11-11 LAB — CBC
HCT: 33.8 % — ABNORMAL LOW (ref 39.0–52.0)
HCT: 37.3 % — ABNORMAL LOW (ref 39.0–52.0)
Hemoglobin: 12.6 g/dL — ABNORMAL LOW (ref 13.0–17.0)
MCH: 30.7 pg (ref 26.0–34.0)
MCH: 30.7 pg (ref 26.0–34.0)
MCH: 30.7 pg (ref 26.0–34.0)
MCHC: 33.7 g/dL (ref 30.0–36.0)
MCHC: 33.8 g/dL (ref 30.0–36.0)
MCV: 91.1 fL (ref 78.0–100.0)
Platelets: 101 10*3/uL — ABNORMAL LOW (ref 150–400)
RBC: 3.71 MIL/uL — ABNORMAL LOW (ref 4.22–5.81)
RBC: 4.09 MIL/uL — ABNORMAL LOW (ref 4.22–5.81)
RDW: 14.5 % (ref 11.5–15.5)
RDW: 14.7 % (ref 11.5–15.5)

## 2010-11-11 LAB — PHOSPHORUS: Phosphorus: 3.3 mg/dL (ref 2.3–4.6)

## 2010-11-11 LAB — POCT I-STAT, CHEM 8
BUN: 33 mg/dL — ABNORMAL HIGH (ref 6–23)
Calcium, Ion: 1.18 mmol/L (ref 1.12–1.32)
Chloride: 105 mEq/L (ref 96–112)
Sodium: 143 mEq/L (ref 135–145)

## 2010-11-11 LAB — COMPREHENSIVE METABOLIC PANEL
Alkaline Phosphatase: 34 U/L — ABNORMAL LOW (ref 39–117)
BUN: 30 mg/dL — ABNORMAL HIGH (ref 6–23)
Calcium: 8.6 mg/dL (ref 8.4–10.5)
Glucose, Bld: 108 mg/dL — ABNORMAL HIGH (ref 70–99)
Total Protein: 6.3 g/dL (ref 6.0–8.3)

## 2010-11-11 LAB — MAGNESIUM: Magnesium: 1.6 mg/dL (ref 1.5–2.5)

## 2010-11-11 LAB — CARDIAC PANEL(CRET KIN+CKTOT+MB+TROPI)
Relative Index: 2.1 (ref 0.0–2.5)
Total CK: 133 U/L (ref 7–232)
Total CK: 253 U/L — ABNORMAL HIGH (ref 7–232)
Troponin I: 0.01 ng/mL (ref 0.00–0.06)
Troponin I: 0.01 ng/mL (ref 0.00–0.06)
Troponin I: 0.02 ng/mL (ref 0.00–0.06)

## 2010-11-11 LAB — BASIC METABOLIC PANEL
BUN: 19 mg/dL (ref 6–23)
CO2: 30 mEq/L (ref 19–32)
Calcium: 9.2 mg/dL (ref 8.4–10.5)
Creatinine, Ser: 1.02 mg/dL (ref 0.4–1.5)
GFR calc Af Amer: >60 mL/min (ref >60)
Glucose, Bld: 92 mg/dL (ref 70–99)

## 2010-11-11 LAB — DIFFERENTIAL
Eosinophils Absolute: 0.2 10*3/uL (ref 0.0–0.7)
Lymphs Abs: 0.6 10*3/uL — ABNORMAL LOW (ref 0.7–4.0)
Monocytes Absolute: 0.4 10*3/uL (ref 0.1–1.0)
Monocytes Relative: 7 % (ref 3–12)
Neutrophils Relative %: 79 % — ABNORMAL HIGH (ref 43–77)

## 2010-11-11 LAB — URINALYSIS, ROUTINE W REFLEX MICROSCOPIC
Hgb urine dipstick: NEGATIVE
Specific Gravity, Urine: 1.017 (ref 1.005–1.030)
Urobilinogen, UA: 1 mg/dL (ref 0.0–1.0)

## 2010-11-11 LAB — BRAIN NATRIURETIC PEPTIDE: Pro B Natriuretic peptide (BNP): <30 pg/mL (ref 0.0–100.0)

## 2010-11-11 LAB — PROTIME-INR: INR: 1.25 (ref 0.00–1.49)

## 2010-11-11 LAB — SEDIMENTATION RATE: Sed Rate: 65 mm/hr — ABNORMAL HIGH (ref 0–16)

## 2010-11-11 LAB — URINE CULTURE

## 2010-11-14 ENCOUNTER — Other Ambulatory Visit (HOSPITAL_COMMUNITY): Payer: Self-pay | Admitting: Family Medicine

## 2010-11-17 ENCOUNTER — Ambulatory Visit (HOSPITAL_COMMUNITY)
Admission: RE | Admit: 2010-11-17 | Discharge: 2010-11-17 | Disposition: A | Discharge status: Home / Self Care | Payer: Medicare Other | Source: Ambulatory Visit | Attending: Family Medicine | Admitting: Family Medicine

## 2010-11-17 DIAGNOSIS — R1310 Dysphagia, unspecified: Secondary | ICD-10-CM | POA: Insufficient documentation

## 2010-11-18 ENCOUNTER — Ambulatory Visit (HOSPITAL_COMMUNITY): Payer: Medicare Other

## 2010-11-30 ENCOUNTER — Ambulatory Visit: Payer: Medicare Other | Attending: Family Medicine

## 2010-11-30 DIAGNOSIS — R1312 Dysphagia, oropharyngeal phase: Secondary | ICD-10-CM | POA: Insufficient documentation

## 2010-11-30 DIAGNOSIS — IMO0001 Reserved for inherently not codable concepts without codable children: Secondary | ICD-10-CM | POA: Insufficient documentation

## 2010-12-01 LAB — URINE CULTURE: Culture: NO GROWTH

## 2010-12-01 LAB — URINALYSIS, ROUTINE W REFLEX MICROSCOPIC
Bilirubin Urine: NEGATIVE
Ketones, ur: NEGATIVE mg/dL
Protein, ur: NEGATIVE mg/dL
Urobilinogen, UA: 0.2 mg/dL (ref 0.0–1.0)

## 2010-12-01 LAB — URINE MICROSCOPIC-ADD ON

## 2010-12-05 ENCOUNTER — Ambulatory Visit: Payer: Medicare Other

## 2010-12-07 ENCOUNTER — Ambulatory Visit: Payer: Medicare Other

## 2010-12-07 LAB — CBC
Hemoglobin: 13.2 g/dL (ref 13.0–17.0)
MCHC: 34.2 g/dL (ref 30.0–36.0)
MCV: 89.9 fL (ref 78.0–100.0)
RDW: 14.5 % (ref 11.5–15.5)

## 2010-12-07 LAB — GLUCOSE, CAPILLARY: Glucose-Capillary: 87 mg/dL (ref 70–99)

## 2010-12-07 LAB — PROTIME-INR: Prothrombin Time: 15.1 seconds (ref 11.6–15.2)

## 2010-12-08 LAB — CBC
Platelets: 158 10*3/uL (ref 150–400)
RBC: 4.34 MIL/uL (ref 4.22–5.81)
WBC: 5.5 10*3/uL (ref 4.0–10.5)

## 2010-12-08 LAB — URINE MICROSCOPIC-ADD ON

## 2010-12-08 LAB — DIFFERENTIAL
Lymphocytes Relative: 13 % (ref 12–46)
Lymphs Abs: 0.7 10*3/uL (ref 0.7–4.0)
Neutro Abs: 4.2 10*3/uL (ref 1.7–7.7)
Neutrophils Relative %: 77 % (ref 43–77)

## 2010-12-08 LAB — URINALYSIS, ROUTINE W REFLEX MICROSCOPIC
Hgb urine dipstick: NEGATIVE
Nitrite: NEGATIVE
Specific Gravity, Urine: 1.016 (ref 1.005–1.030)
Urobilinogen, UA: 0.2 mg/dL (ref 0.0–1.0)
pH: 7 (ref 5.0–8.0)

## 2010-12-08 LAB — HEPATIC FUNCTION PANEL
ALT: 18 U/L (ref 0–53)
AST: 25 U/L (ref 0–37)
Albumin: 3.3 g/dL — ABNORMAL LOW (ref 3.5–5.2)
Alkaline Phosphatase: 43 U/L (ref 39–117)
Total Protein: 6.9 g/dL (ref 6.0–8.3)

## 2010-12-08 LAB — CREATININE, SERUM: Creatinine, Ser: 1.16 mg/dL (ref 0.4–1.5)

## 2010-12-08 LAB — BASIC METABOLIC PANEL
BUN: 20 mg/dL (ref 6–23)
Creatinine, Ser: 1.13 mg/dL (ref 0.4–1.5)
GFR calc Af Amer: >60 mL/min (ref >60)
GFR calc non Af Amer: >60 mL/min (ref >60)

## 2010-12-08 LAB — LIPASE, BLOOD: Lipase: 21 U/L (ref 11–59)

## 2010-12-09 ENCOUNTER — Ambulatory Visit: Payer: Medicare Other

## 2010-12-13 ENCOUNTER — Ambulatory Visit: Payer: Medicare Other

## 2010-12-14 ENCOUNTER — Ambulatory Visit: Payer: Medicare Other

## 2010-12-16 ENCOUNTER — Ambulatory Visit: Payer: Medicare Other

## 2010-12-19 ENCOUNTER — Ambulatory Visit: Payer: Medicare Other

## 2010-12-21 ENCOUNTER — Ambulatory Visit: Payer: Medicare Other

## 2010-12-22 ENCOUNTER — Ambulatory Visit: Payer: Medicare Other | Attending: Radiation Oncology | Admitting: Radiation Oncology

## 2010-12-23 ENCOUNTER — Ambulatory Visit: Payer: Medicare Other

## 2010-12-27 ENCOUNTER — Ambulatory Visit: Payer: Medicare Other | Attending: Family Medicine

## 2010-12-27 DIAGNOSIS — R1312 Dysphagia, oropharyngeal phase: Secondary | ICD-10-CM | POA: Insufficient documentation

## 2010-12-27 DIAGNOSIS — IMO0001 Reserved for inherently not codable concepts without codable children: Secondary | ICD-10-CM | POA: Insufficient documentation

## 2010-12-28 ENCOUNTER — Other Ambulatory Visit (HOSPITAL_COMMUNITY): Payer: Self-pay | Admitting: Family Medicine

## 2010-12-28 ENCOUNTER — Ambulatory Visit: Payer: Medicare Other

## 2010-12-29 ENCOUNTER — Ambulatory Visit: Payer: Medicare Other | Attending: Radiation Oncology | Admitting: Radiation Oncology

## 2010-12-30 ENCOUNTER — Ambulatory Visit: Payer: Medicare Other

## 2011-01-02 ENCOUNTER — Ambulatory Visit (HOSPITAL_COMMUNITY): Payer: Medicare Other

## 2011-01-02 ENCOUNTER — Other Ambulatory Visit (HOSPITAL_COMMUNITY): Payer: Medicare Other

## 2011-01-04 ENCOUNTER — Other Ambulatory Visit (HOSPITAL_COMMUNITY): Payer: Self-pay | Admitting: Family Medicine

## 2011-01-05 ENCOUNTER — Ambulatory Visit (HOSPITAL_COMMUNITY)
Admission: RE | Admit: 2011-01-05 | Discharge: 2011-01-05 | Disposition: A | Discharge status: Home / Self Care | Payer: Medicare Other | Source: Ambulatory Visit | Attending: Family Medicine | Admitting: Family Medicine

## 2011-01-05 DIAGNOSIS — C61 Malignant neoplasm of prostate: Secondary | ICD-10-CM | POA: Insufficient documentation

## 2011-01-05 DIAGNOSIS — C3490 Malignant neoplasm of unspecified part of unspecified bronchus or lung: Secondary | ICD-10-CM | POA: Insufficient documentation

## 2011-01-05 DIAGNOSIS — R1310 Dysphagia, unspecified: Secondary | ICD-10-CM | POA: Insufficient documentation

## 2011-01-10 NOTE — Discharge Summary (Signed)
NAME:  Roy [name], Roy [name]                 ACCOUNT NO.:  [id number]   MEDICAL RECORD NO.:  [id number]          PATIENT TYPE:  INP   LOCATION:  5733                         FACILITY:  [facility]   PHYSICIAN:  Madaline Savage, MD        DATE OF BIRTH:  Aug 20, 1921   DATE OF ADMISSION:  01/04/2007  DATE OF DISCHARGE:  01/06/2007                               DISCHARGE SUMMARY   PRIMARY CARE PHYSICIAN:  Evelena Peat, M.D.   DISCHARGE DIAGNOSES:  1. Right lung pneumonia, incompletely treated pneumonia.  2. History of chronic obstructive pulmonary disease.  3. History of diabetes mellitus.  4. History of prostate cancer.  5. History of normocytic anemia.   DISCHARGE MEDICATIONS:  1. Zithromax 500 mg once daily for 7 days.  2. Aspirin 81 mg daily.  3. Nebulizers as before.  4. Oxygen at 2 liters by nasal cannula.  5. Klonopin 2 mg 3 times daily.  6. Singulair 10 mg daily.  7. TriCor 145 grams daily.  8. Spiriva HandiHaler 1 puff daily.   HISTORY OF PRESENT ILLNESS:  For full history and physical, see the  history and physical dictated by Dr. Michaelyn Barter on Jan 03, 2007.  In short, Mr. Baggerly is an 77 year old gentleman who was admitted to  [facility] from December 21, 2006 to December 26, 2006 with  bibasilar pneumonia.  He was discharged home on a week of Avelox. Since  going home, he has had fevers and chills.  In the ED, the x-ray of the  chest still showed pneumonia, and he was complaining of shortness of  breath and some cough.  He was admitted for recurrent pneumonia and was  treated with Rocephin, and Zithromax.   PROBLEM LIST:  1. Pneumonia.  He was admitted and treated with Rocephin and      Zithromax, and since then he has not had any more symptoms.  He had      blood cultures drawn here, and one out of two blood cultures grew      gram positive cocci in clusters.  The final ID of that is still      pending, but since he is clinically improved and he feels back to      his  baseline I am discharging him home.  If his blood cultures do      come back positive, we may need to adjust his medications according      to the sensitivities. He is now being discharged home on Zithromax.  2. History of chronic obstructive pulmonary disease.  He is now it is      baseline.  He will continue to have oxygen at home as before.   DISPOSITION:  He is now being discharged home in stable condition.   FOLLOW UP:  He is asked to follow up with his primary care physician,  Dr. Evelena Peat in one week.      Madaline Savage, MD  Electronically Signed     PKN/MEDQ  D:  01/06/2007  T:  01/07/2007  Job:  [id number]  cc:   Evelena Peat, M.D.

## 2011-01-10 NOTE — Op Note (Signed)
NAME:  Roy [name], Roy [name]                 ACCOUNT NO.:  [id number]   MEDICAL RECORD NO.:  [id number]           PATIENT TYPE:   LOCATION:                                 FACILITY:   PHYSICIAN:  Martina Sinner, MD DATE OF BIRTH:  11/09/1920   DATE OF PROCEDURE:  12/09/2008  DATE OF DISCHARGE:                               OPERATIVE REPORT   PREOPERATIVE DIAGNOSIS:  Stricture and bladder neck contracture.   POSTOPERATIVE DIAGNOSIS:  Stricture and bladder neck contracture and  small bladder calculi.   SURGERY:  Cystoscopy and balloon dilation of bladder neck contraction  and urethral stricture and irrigation of bladder calculi.   INDICATIONS FOR PROCEDURE:  Mr. 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  He has urge incontinence.   DESCRIPTION OF PROCEDURE:  He was prepped and draped in the usual  fashion.  Preoperative antibiotics were given.  Initially a 17-French  scope was utilized.  The penile bulbar urethra were normal.  He had a  stricture near the sphincter mechanism that I could negotiate a 85-  [place] scope like I did last time.  He then had a bladder neck  contracture.  It was approximately 10-French.  I passed a sensor wire.  I balloon dilated for 5 minutes.  I did this under fluoroscopic  guidance.  It went very well.  The balloon dilation went very well for 5  minutes.  I deflated the balloon and removed it.  I then cystoscoped the  patient, and there was no question; his bladder neck was very friable.  It was rigid.  It likes to bleed easily.  He had two small  calcifications like he did last time near the neck of the bladder, and  these were easily irrigated.  A 16-French Coude catheter was easily  inserted.  The urine return was clear.   Because of the friable and somewhat traumatic nature of the bladder neck  dilation, I wanted to keep a catheter in until Friday.  I will have him  come to the office and we will take it out then.  We will then start  trying catheterize him in the clinic once a month.  He was covered with  perioperative antibiotics.  We will proceed accordingly.           ______________________________  Martina Sinner, MD  Electronically Signed     SAM/MEDQ  D:  12/09/2008  T:  12/09/2008  Job:  [id number]

## 2011-01-10 NOTE — Op Note (Signed)
NAME:  Roy [name], Roy [name]                 ACCOUNT NO.:  [id number]   MEDICAL RECORD NO.:  [id number]          PATIENT TYPE:  AMB   LOCATION:  [facility]                         FACILITY:  [facility]   PHYSICIAN:  Martina Sinner, MD DATE OF BIRTH:  1920/11/18   DATE OF PROCEDURE:  DATE OF DISCHARGE:                               OPERATIVE REPORT   PREOPERATIVE DIAGNOSIS:  Urethral stricture x2.   POSTOPERATIVE DIAGNOSIS:  Urethral stricture x2.   PROCEDURES:  1. Cystourethroscopy.  2. Balloon dilation of urethral strictures.   ANESTHESIA:  General.   SPECIMENS:  Urine for culture and sensitivity.   INDICATIONS FOR PROCEDURE:  Roy [name] is an 77 year old white male with  past medical history positive for prostate cancer treated with radiation  therapy.  He has since developed at least one known proximal urethral  stricture that has been dilated on multiple occasions.  He was having  trouble with urinary retention and recurrent urinary tract infections.  He has since stopped the intermittent catheterization and now is on a  schedule to have his urethral stricture dilated as needed.  He has not  had any urinary tract infections recently.  He is otherwise in good  health preoperatively.  Risks, benefits, consequences and concerns were  discussed with him.  Informed consent was obtained.   PROCEDURE IN DETAIL:  The patient is brought to the operating room and  placed in supine position.  He is correctly identified by his wristband  and appropriate time-out was taken.  IV antibiotics were administered.  General anesthesia was delivered.  Once adequately anesthetized, he was  placed in dorsal lithotomy position with great care taken to minimize  the risk of peripheral neuropathy or compartment syndrome.  His perineum  was prepped and draped sterilely.  We began our procedure by performing  rigid cystourethroscopy with a 17-French cystoscopic sheath, 12 degree  lens and sterile water as our  irrigant.  Anterior urethra was normal in  its course and caliber.  He did have a circumferential stricture that  would not allow passage of our 17-French scope approximate 1 cm distal  to the external sphincter.  Looking more proximally, there is a  stricture in the prostatic urethra which is his original stricture, it  appears as if a second stricture was now formed.  We were able to  advance a sensor tip guidewire around the cystoscope and direct it  through both these strictures in the bladder, that was verified by  fluoroscopy.  The cystoscope was removed and then we advanced a UroMax  15 cm 20 atmosphere balloon over the wire, placing it with fluoroscopic  guidance.  We then inflated the balloon with half strength contrast to  16 atmospheres and left it there for 5 minutes.  We then removed the  balloon dilator, leaving the guidewire in place and replaced a  cystoscope and easily advanced the cystoscope through both strictures  into the bladder.  Upon entry in the bladder, dark amber colored urine  was appreciated.  No foreign bodies or stones or urothelial  abnormalities were seen.  Both  ureteral orifices were noted to be in  their normal anatomic position.  He did have diffuse trabeculations.  No  cellules or diverticula were seen.  We did get a urine specimen for  culture.  At this point, we slowly withdrew the scope in an antegrade  fashion out his urethra and we were able to re-appreciate both area  strictures which were now widely  patent, neither of which were bleeding.  Likewise his bladder was  drained and the guidewire was removed and this marked the end of our  procedure.  He tolerated procedure well.  There were no complications.  Dr. Sherron Monday was present and participated in all aspects of the case.     ______________________________  Roy [name], M.D.      Martina Sinner, MD  Electronically Signed    DW/MEDQ  D:  03/24/2008  T:  03/24/2008  Roy [name]:   [phone]

## 2011-01-10 NOTE — Letter (Signed)
Dec 30, 2008   Martina Sinner, MD  [address] [place] [address]  [place], [place] [address]   Re:  FONG, MCCARRY                 DOB:  1920/10/26   Dear Lorin Picket,   I saw the patient back today and his needle biopsy revealed non-small  cell lung cancer.  His pulmonary function tests were much better than I  thought with an FVC of 2.58 with an FEV-1 of 1.87 and a diffusion  capacity of 66% uncorrected.  I think he would be able to tolerate at  least a right lower lobe basilar segmentectomy if not a right lower  lobectomy.  The only problem I have discussed this with him and I will  also have him see our radiation oncologist to get an opinion.  I have  tentatively scheduled him for surgery, but given his age, it would be an  increased risk at the time of surgery.  I did explain to him that  surgery would be the better option for curing a possible stage IA or IB.  I appreciate the opportunity of seeing the patient.   Sincerely,   Ines Bloomer, M.D.  Electronically Signed   DPB/MEDQ  D:  12/30/2008  T:  12/31/2008  Job:  [id number]   cc:   Leslye Peer, MD

## 2011-01-10 NOTE — Op Note (Signed)
NAME:  Roy [name], Roy [name]                 ACCOUNT NO.:  11/12/2007   MEDICAL RECORD NO.:  [id number]          PATIENT TYPE:  AMB   LOCATION:  [facility]                         FACILITY:  [facility]   PHYSICIAN:  Martina Sinner, MD DATE OF BIRTH:  Mar 31, 1921   DATE OF PROCEDURE:  11/12/2007  DATE OF DISCHARGE:                               OPERATIVE REPORT   PREOPERATIVE DIAGNOSIS:  Membranous/prostatic urethral stricture.   POSTOPERATIVE DIAGNOSES:  1. Membranous/prostatic urethral stricture.  2. 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   PROCEDURES:  1. Rigid cystourethroscopy.  2. Urethral balloon dilation.  3. Bladder biopsy with fulguration.   ANESTHESIA:  General/LMA.   INDICATIONS FOR PROCEDURE:  Roy [name] is an 77 year old white male with a  past medical history significant for prostate cancer.  This was treated  with radiation, and he subsequently developed urethral strictures.  In  the past, he has had balloon dilation and has also performed self  dilations over a catheter.  He has been seen by Dr. Sherron Monday in the  clinic and complains of decreased urinary flow and subsequent treatment  options were discussed which included cystourethroscopy with balloon  dilation which he accepted.  The risks, consequences and benefits have  been explained to him, and informed consent was obtained preoperatively.   PROCEDURE IN DETAIL:  The patient was brought to the operating room and  placed in a supine position.  He was correctly identified by his wrist  band and an appropriate time out was taken.  General anesthesia was  delivered.  IV antibiotics were administered.  He was placed in a dorsal  lithotomy position with great care taken to minimize the risk of  peripheral neuropathy or compartment syndrome.  His perineum was prepped  and draped sterilely.  We used Hibiclens due to his IODINE allergy.  We  began our procedure by performing rigid  urethroscopy with a 17-French  cystoscopic sheath and 12-degree lens and sterile water as our irrigant.  His penile and bulbar urethra was normal and course in caliber.  Upon  approaching the membranous urethra, a narrow stricture was identified.  This was approximately 10 [place] in diameter and would not allow passage  of our sheath.  Looking more proximally, further strictures were also  identified.  We then advanced a sensor tip guide wire through the  cystoscope into the bladder confirmed by fluoroscopy.  We then removed  the cystoscope and placed a Cook 8-French 15 cm 20 mmHg urethral balloon  dilator over the wire, confirmed its position both with tactile  sensation as well as fluoroscopy.  Once placed appropriately, we  inflated the balloon with contrast to approximately 18 mmHg.  Fluoroscopy demonstrated good placement.  The waist in the balloon  resolved with inflation.  It was held at 18 mmHg for 5 minutes.  We then  released the pressure and removed the balloon leaving the guide wire  behind.  We then reinserted the cystoscope and were able to easily pass  into the bladder.  The urethral dilation was successful  at multiple  levels.  There was no active bleeding.  The urethral stricture scar was  noted to be blanched white.  Upon entry into the bladder, clear urine  was identified.  There was a slight amount of mucus noted.  Ureteral  orifices were noted to be in their normal anatomic position effluxing  clear urine.  The bladder was mildly trabeculated with some small  cellules.  Of note, there was an area directly across from the urethra  on the posterior wall of the bladder which showed a papillary-type  appearance with some surrounding erythema and hypervascularity  suggestive of possible transitional cell carcinoma of the bladder.  No  other urothelial abnormalities were appreciated.  No bladder stones were  seen.  Likewise, we removed the cystoscope and sheath, replaced  with a  22 French rigid cystoscopic sheath which passed with a little bit of  resistance into the bladder.  Leaving the sheath behind, we were able to  advance the biopsy forceps through the sheath and subsequently took a  biopsy of this lesion under direct vision.  The biopsy was then passed  off the table, replaced the scope and bridge into the sheath and  fulgurated the biopsy site with Bugbee electrocautery.  We then drained  his bladder and refilled it.  There was no active bleeding.  No other  abnormalities were seen.  Subsequently, we left his bladder  approximately half full and removed the cystoscope.  This marked the end  of our procedure.  He tolerated the procedure well.  There were no  complications.  Dr. Sherron Monday was present and participated in all  aspects of the case.     ______________________________  Delana Meyer, MD  Electronically Signed    DW/MEDQ  D:  11/12/2007  T:  11/12/2007  Job:  [id number]

## 2011-01-10 NOTE — Op Note (Signed)
NAME:  Roy [name], Roy [name]                 ACCOUNT NO.:  [id number]   MEDICAL RECORD NO.:  [id number]          PATIENT TYPE:  AMB   LOCATION:  [facility]                         FACILITY:   [facility]   PHYSICIAN:  Martina Sinner, MD DATE OF BIRTH:  08-Jan-1921   DATE OF PROCEDURE:  06/30/2008  DATE OF DISCHARGE:                               OPERATIVE REPORT   PREOPERATIVE DIAGNOSIS:  Urethral stricture.   POSTOPERATIVE DIAGNOSES:  1. Urethral stricture.  2. Small bladder calculi.   SURGERY:  1. Cystoscopy.  2. Balloon dilation of urethral stricture.  3. Irrigation of small bladder calculi.   Mr. [name] has a recurrent stricture.  He has had radiation.   The patient was prepped and draped in the usual fashion.  He was given  ciprofloxacin prior to the procedure.  I initially scoped with a 86-  [place] scope and I was able to negotiate through his distal stricture  that was approximately a 12-French and then through the membranous  urethral stricture that was also approximately an 8 to 12-French into  the bladder.  He had some dystrophic calcification at the neck of the  bladder that was pushed into the bladder.  The bladder had mild  erythema, but there was no obvious infection and the urine was not foul-  smelling or cloudy.   He had a little bit of a web of mucosal lifting at 12 o'clock near the  first stricture, so I did place a sensor wire into the bladder under  cystoscopic and fluoroscopic guidance.  I then passed the balloon  dilation catheter over it and dilated it for 5 minutes under  fluoroscopic guidance.  I removed the balloon after deflated it.  I  cystoscoped it with a 17-French scope easily into the bladder.  The  bladder neck was wide open.  I could no longer find the stones, but I  did use a Toomey syringe and irrigated the small calculi fragments.  There was a little bit of sandy gravel.   I thought it was best to put in a 16-French [place] catheter over 24  hours  and give Vicodin and ciprofloxacin for home.   The patient be seen in clinic as per protocol.           ______________________________  Martina Sinner, MD  Electronically Signed     SAM/MEDQ  D:  06/30/2008  T:  06/30/2008  Job:  [id number]

## 2011-01-10 NOTE — H&P (Signed)
NAME:  Roy, [name] NO.:  [id number]   MEDICAL RECORD NO.:  [id number]           PATIENT TYPE:   LOCATION:                               FACILITY:  [facility]   PHYSICIAN:  Michaelyn Barter, M.D.      DATE OF BIRTH:   DATE OF ADMISSION:  01/03/2007  DATE OF DISCHARGE:                              HISTORY & PHYSICAL   PRIMARY CARE PHYSICIAN:  Evelena Peat, M.D.   CHIEF COMPLAINT:  Shakes and fever.   HISTORY OF PRESENT ILLNESS:  [name] is an 77 year old gentleman  who was recently treated at [facility] from December 21, 2006 up  until December 26, 2006.  During that timeframe he was treated for  bibasilar pneumonia.  He indicates that he began to feel better and was  subsequently discharged from the hospital.  He does indicate that he had  some slight chest discomfort on the day of his discharge from the  hospital; however, otherwise he felt okay.  At approximately noon today  he began to feel cold and developed shakes as well as a headache.  He  also complains of some slight chest discomfort.  His symptoms lasted for  several hours and at approximately 6 p.m. he developed a fever of 101.5.  EMS was called.  The patient denies having a cough.  No nausea or  vomiting, no shortness of breath.  He also denies having any diarrhea or  any changes in his bowel habits.   PAST MEDICAL HISTORY:  1. Recently treated bibasilar pneumonia.  2. COPD.  3. Bronchial asthma.  4. Diabetes mellitus.  5. Prostate cancer.  6. Urethral stricture.  7. Normocytic anemia.  8. Hypokalemia.  9. The  patient also has a history of essential tremor.   PAST SURGICAL HISTORY:  1. Bilateral cataract removal.  2. Resection of the prostate.  3. Direct visual urethrotomy secondary to urethral stricture completed      by Dr. Javier Glazier on March 02, 2005.   ALLERGIES:  IODINE makes the patient very ill.   CURRENT MEDICATIONS:  1. Aspirin 81 mg daily.  2. Albuterol  nebulizers with Atrovent.  3. Home oxygen.  4. Klonopin 2 mg t.i.d.  5. Singulair 10 mg daily.  6. Tricor 145 mg p.o. b.i.d.  7. Spiriva.   FAMILY HISTORY:  The patient's brother died from lung cancer.  His  sister died from colon cancer.  Father died from cancer.   SOCIAL HISTORY:  Cigarettes:  The patient smoked one pack of cigarettes  per day for at least 40 years.  He has a remote history of alcohol  consumption; however, he states that it was not heavy.   REVIEW OF SYSTEMS:  As per HPI.   PHYSICAL EXAMINATION:  GENERAL:  The patient is awake, he is  cooperative.  He shows no obvious signs of breathing difficulties.  He  has a tremor at rest which is generalized.  VITAL SIGNS:  Temperature is 101.5, blood pressure 101/49, heart rate  127, respiratory 20, O2 saturation 95%.  HEENT:  Normocephalic, anicteric,  atraumatic.  Extraocular movements are  intact.  Pupils are equally reactive to light.  Oral mucosa is pink.  No  thrush, no exudate.  NECK:  Supple.  No lymphadenopathy.  No thyromegaly.  CARDIAC:  Heart sounds are distant.  RESPIRATORY:  No crackles or wheezes.  ABDOMEN:  Soft.  Nontender, nondistended.  Positive bowel sounds.  No  masses.  EXTREMITIES:  Trace bilateral leg edema.  NEUROLOGIC:  The patient is alert and oriented x3.  MUSCULOSKELETAL:  5/5.   LABORATORY DATA:  Chest x-ray reveals pulmonary fibrosis, interval  increase in right lung edema or infiltrate.   ASSESSMENT/PLAN:  1. Febrile illness.  The etiology of this is questionable.  Whether or      not this is secondary to an incompletely treated pneumonia is also      questionable.  The patient does indicate that he has completed his      prescribed course of antibiotics.  He also denies the presence of      any cough or shortness of breath.  Therefore the relationship to      the previously diagnosed pneumonia is also questionable.  Will      start the  patient on empiric IV antibiotics consisting of  Rocephin      and azithromycin for now until the source of the patient's fever is      teased out.  Once it becomes clear as to what actually precipitated      the patient's current febrile presentation, will adjust the      patient's medications.  Will check blood, sputum, and a urinalysis,      plus microscopy.  2. Chest discomfort.  This sounds very atypical.  Will check an EKG      and cycle the patient's cardiac enzymes x3 q.8 h. apart.  3. History of chronic obstructive pulmonary disease.  The patient      currently denies the presence of shortness of breath.  Will resume      his oxygen and start nebulized breathing treatments.  4. History of diabetes mellitus.  Will start Accu-Cheks as well as      sliding scale insulin.  5. GI prophylaxis.  Will provide Protonix.  6. Deep venous thrombosis prophylaxis.  Will provide Lovenox.      Michaelyn Barter, M.D.  Electronically Signed    OR/MEDQ  D:  01/03/2007  T:  01/04/2007  Job:  [id number]   cc:   Evelena Peat, M.D.

## 2011-01-10 NOTE — Letter (Signed)
December 17, 2008   Martina Sinner, MD  [address] [place] [address]  [place], [place] [address]   Re:  Roy [name], Roy [name]                 DOB:  [birth year]   Dear Lorin Picket:   I saw the patient in the office today.  The patient has had prostate  cancer, it was treated with radiation.  At the time of radiation, he  apparently developed an urethral disruption causing a stricture and has  had multiple cystoscopes for urethral stricture.  He was getting a  followup CT and MRI, and the MRI showed a right lower lobe lesion.  He  also had evidence of pulmonary fibrosis.  He said he has had pulmonary  fibrosis for a long period of time, but is known to have extensive  pulmonary fibrosis.  On the MRI, he also has multiple cysts and the  renal cyst with some of this which contain the hemorrhage.   He lives next to his daughter here in [place]; before, however, lived  in [place].   PAST MEDICAL HISTORY:  He is on home oxygen.   ALLERGIES:  He has got ALLERGY TO IODINE CRYSTALS AND SULFA.   MEDICATIONS:  He takes Klonopin 2 mg daily, [place]ura XR 60 mg daily,  Spiriva 18 mcg daily, TriCor 145 mg a day, Singulair __________ mg  daily, and oxygen at night.   He has got no fever, chills, excessive sputum.  He has  hypercholesterolemia and hypertension, history of nephrolithiasis,  history of depression, history of asthma, history of anxiety.   He has surgeries of appendectomy, cystoscopes, and prostate surgery.   FAMILY HISTORY:  Positive for prostate cancer and renal failure.   SOCIAL HISTORY:  He is a widower.  He has 1 child who is in the [employer].  He quit smoking in 1990.  He does not drink alcohol on a regular  basis.   REVIEW OF SYSTEMS:  He is 155 pounds.  He is 5 feet 7 inches.  General:  His weight has been stable.  Cardiac:  No angina or atrial fibrillation.  Pulmonary:  Home oxygen, bronchitis.  No hemoptysis.  GI:  No  constipation or diarrhea.  No hiatal hernia.  GU:  No  frequent  urination.  See past medical history and history of present illness.  Vascular:  He has got a slurred speech.  Neurologic:  No dizziness,  headaches, blackouts, or seizures.  Musculoskeletal:  He got a rash.  Psychiatric:  Nervousness and depression.  Eye/ENT:  No change in  eyesight or hearing.  Hematologic:  No problems with bleeding or  clotting disorders.   PHYSICAL EXAMINATION:  GENERAL:  He is a well-developed Caucasian male  in no acute distress.  HEAD, EYES, EARS, NOSE, AND THROAT:  Unremarkable.  NECK:  Supple without thyromegaly.  There is no supraclavicular or  axillary adenopathy.  CHEST:  Clear to auscultation and percussion.  HEART:  Regular, sinus rhythm, no murmurs.  ABDOMEN:  Soft.  No hepatosplenomegaly.  EXTREMITIES:  Pulses are 2+ with easy bruisability.  NEUROLOGIC:  He is oriented x3.  Sensory and motor intact.   I discussed the situation with the patient and I have recommended he get  a needle biopsy of his right lower lobe mass and also has because of his  pulmonary fibrosis, we will get a full set of pulmonary function test,  but I doubt if he  is an operative candidate.  He is appropriate  candidate for radiation therapy.  We will await until we get his biopsy  back.   I appreciate the opportunity of seeing this patient.   Sincerely,   Ines Bloomer, M.D.  Electronically Signed   DPB/MEDQ  D:  12/17/2008  T:  12/17/2008  Job:  [id number]   cc:   Heloise Purpura, MD

## 2011-01-10 NOTE — Assessment & Plan Note (Signed)
OFFICE VISIT   [name], [name]  DOB:  [birth year]                                       12/29/2009  CHART#:[id number]   The patient is an 77 year old male referred for evaluation by Dr. Larina Bras  for abdominal aortic aneurysm and severe mesenteric artery dissection.  The patient was admitted to [facility] in [place] on March  28.  At that time he was admitted with abdominal pain.  A CT angiogram  showed a superior mesenteric artery dissection as well as a small  abdominal aneurysm.  His abdominal pain resolved during his hospital  stay.  He is referred here today for further evaluation.   CHRONIC MEDICAL PROBLEMS:  Include renal dysfunction, elevated  cholesterol, prior history of prostate cancer.  These are all currently  under control and followed by Dr. Larina Bras.   The patient also states that he had a urethral injury during treatment  of his prostate cancer and that occasionally this acts up requiring  dilation of his urethra.  This is followed by Dr. Barbaraann Share.  He states  that he thinks his abdominal pain was secondary to this at his recent  hospital admission.  He denies any postprandial abdominal pain.  He  denies any food fear.   PAST MEDICAL HISTORY:  Remarkable as mentioned above for his prostate.  He has had an appendectomy and he also mentioned that he had lung  cancer.   FAMILY HISTORY:  Is unremarkable.   SOCIAL HISTORY:  He is widowed.  He has one child.  He is a former  smoker but quit in 1995.  He does not consume alcohol regularly.   REVIEW OF SYSTEMS:  Full 12 point review of systems was performed with  the patient today.  Please see intake referral form for details  regarding this.   MEDICATIONS:  Include Nasacort, Spiriva, clonazepam, TriCor, Singulair,  loratadine, Sanctura XR, vitamin D3 and fish oil.   ALLERGIES:  He has an allergy listed to sulfa.   PHYSICAL EXAM:  Vital signs:  Blood pressure is 103/68 in the left  arm,  heart rate is 101 and regular.  Temperature is 97.8.  HEENT:  Unremarkable.  Neck:  Has 2+ carotid pulses without bruit.  Chest:  Clear to auscultation.  Cardiac:  Is regular rate and rhythm without  murmur.  Abdomen:  Soft, nontender, nondistended.  No bruits.  No  masses.  Extremities:  He has no significant major joint deformities.  Lower extremities:  He has 2+ femoral and 2+ dorsalis pedis pulses  bilaterally.  He has no significant edema in the lower extremities.  Skin:  Has no open ulcers or rashes.  Neurologic:  Shows symmetric upper  extremity and lower extremity motor strength which is 5/5.  He does have  an essential tremor.   I reviewed his CT angiogram images today that were brought by him on a  CD-ROM.  This shows a dissection in the middle third of the superior  mesenteric artery that does not appear flow limiting.  His abdominal  aorta is essentially normal in caliber although it was measured as 2.6  cm in diameter and was called an infrarenal abdominal aortic aneurysm,  normal aortic diameter is usually 2.5 cm in diameter.   In summary, the patient has an asymptomatic superior mesenteric artery  dissection.  With  his advanced age and all of his underlying medical  problems including the use of home oxygen I do not believe he would be a  candidate for an open operative procedure.  Fortunately, this is not a  symptomatic dissection.  I do not believe this warrants any treatment at  this time.  As far as his aneurysm is concerned in light of his age and  the very small diameter of the aorta I do not believe he necessarily  needs further followup for this as well.  For these two problems he can  follow up with me on an as-needed basis.     Janetta Hora. Fields, MD  Electronically Signed   CEF/MEDQ  D:  12/29/2009  T:  12/30/2009  Job:  [id number]   cc:   Ace Gins, MD

## 2011-01-10 NOTE — H&P (Signed)
NAME:  Roy [name], Roy [name]                 ACCOUNT NO.:  [id number]   MEDICAL RECORD NO.:  [id number]          PATIENT TYPE:  INP   LOCATION:  1413                         FACILITY:  [facility]   PHYSICIAN:  Herbie Saxon, MDDATE OF BIRTH:  1921/04/15   DATE OF ADMISSION:  12/21/2006  DATE OF DISCHARGE:                              HISTORY & PHYSICAL   PRIMARY CARE PHYSICIAN:  Dr. Electa Sniff.   UROLOGIST:  Leighton Roach McDiarmid, MD.   PRESENTING COMPLAINT:  Fever, one day.   HISTORY OF PRESENTING COMPLAINT:  This is an 77 year old Caucasian male,  who was discharged on November 19, 2006, with a COPD exacerbation.  He had  been quite well at home until this afternoon when he woke up with fever,  chills, and rigors.  He had been having cough productive of a reddish  phlegm for two to three days prior to this with associated shortness of  breath and wheezing.  Patient denies any chest pain, any dizziness or  lightheadedness.  He had no diarrhea, constipation, hematemesis, or  hemoptysis.  He has no urinary symptoms, no dysuria, frequency, or  urgency, difficulty with initiating his stream, he has an indwelling  catheter.  In the emergency room, his x-ray was positive for infiltrates  at the bases.   PAST MEDICAL HISTORY:  1. Coronary artery disease.  2. COPD.  3. Bronchial asthma.  4. Bronchitis.  5. Diabetes.  6. Prostate cancer.  7. Urethral stricture.   FAMILY HISTORY:  A brother had lung cancer, sister with colon cancer,  father had occult blood and cancer.   SOCIAL HISTORY:  He smoked for more than 40 years, one pack per day.  No  history of alcohol or drug abuse.  He quit smoking in 1991.   MEDICATIONS:  1. Singulair 100 mg daily.  2. Sanctura 60 mg daily.  3. Nitrofurantoin 200 mg daily.  4. Clonazepam 2 mg t.i.d.  5. Tricor 145 mg daily.  6. Home oxygen.  7. Atrovent, Proventil, and DuoNeb's q.6h p.r.n.   He is ALLERGIC TO IODINE.   REVIEW OF SYSTEMS:  All systems  reviewed and positive only as above.  There is no skin rash, no joint swelling, no mood disorder, no swollen  glands.   PHYSICAL EXAMINATION:  GENERAL:  He is an elderly man in mild acute  respiratory distress.  VITAL SIGNS:  Temperature is 101.2, pulse is 130, respiratory rate is  20, blood pressure is 142/61.  HEAD:  Atraumatic and normocephalic.  ENT:  Pupils are equal and reactive to light and accommodation.  Oropharynx and nasopharynx are clear.  Mucous membranes are moist.  NECK:  There is no elevated JVD or thyromegaly, no carotid bruit.  CARDIAC:  Heart sounds 1 and 2, tachycardic.  LUNGS:  He has bibasilar coarse rales.  ABDOMEN:  Soft and nontender, no organomegaly, bowel sounds are  normoactive.  He is alert and oriented x4.  Pulses present, no pedal edema, there is no clubbing, no cyanosis.   LABORATORY DATA:  WBC is 3.6, hematocrit is 40, platelet count is 153,  glucose  is 115, sodium 142, potassium 3.5, chloride 105, bicarbonate 25,  BUN 17, creatinine 1.2.  Official chest x-ray report pending.  Initial  preliminary report of basilar infiltrate.  Urinalysis:  Hemoglobin  large, leukocyte negative, WBC of 0.2.   ASSESSMENT:  1. Bibasilar pneumonia.  2. Mild acute respiratory distress.  3. History of chronic obstructive pulmonary disease.  4. Tachycardia, electrocardiogram pending.  5. History of prostate cancer.  6. Urethral stricture.  7. History of diabetes is stable.  8. History of frequent urinary tract infections.   PLAN:  1. The patient is to be admitted to [facility].  2. Diet will be cardiac with 1800 calorie ADA.  3. Activity will be bedrest.  4. Pulse-ox monitoring to keep the O2 SATs greater than 90.  5. IV fluid half-normal saline at 40 ml an hour; watch for fluid      overload.  6. Will check his calcium and I will give him some phosphate.  7. EKG.  8. Blood culture x2.  9. Check his ABGs on room air.  10.Lovenox 40 mg subcu  daily.  11.Protonix 40 mg IV daily.  12.Albuterol 2.5 mg via nebs q.6h.  13.Atrovent 0.5 mg via nebs q.6h.  14.Nasonex 1 mg p.o. b.i.d.  15.He will be on the sliding scale insulin protocol-22 scale.  16.Senokot p.o. q.h.s. for constipation p.r.n.  17.He will be on Rocephin 1 mg IV daily as well as Azithromax 500 mg      IV daily.  18.Cardizem 10 mg IV q.4h p.r.n. if heart rate is irregular and      greater than 109.  19.Aspirin 81 mg daily.  20.Check the ABG and chest x-ray in the a.m.   TIME OF ENCOUNTER:  His illness and the treatment plan were explained to  him and his family, they acknowledged understanding.  The time was 40  minutes.      Herbie Saxon, MD  Electronically Signed     MIO/MEDQ  D:  12/21/2006  T:  12/21/2006  Job:  [id number]

## 2011-01-10 NOTE — Discharge Summary (Signed)
Roy [name], Roy [name]                 ACCOUNT NO.:  [id number]   MEDICAL RECORD NO.:  [id number]          PATIENT TYPE:  INP   LOCATION:  1413                         FACILITY:  [facility]   PHYSICIAN:  Lonia Blood, M.D.      DATE OF BIRTH:  1921/05/28   DATE OF ADMISSION:  12/21/2006  DATE OF DISCHARGE:  12/26/2006                               DISCHARGE SUMMARY   PRIMARY CARE PHYSICIAN:  Dr. Chalmers Guest.   DISCHARGE DIAGNOSES:  1. Bibasilar pneumonia.  2. History of chronic obstructive pulmonary disease but no      exacerbation.  3. Bronchial asthma.  4. Diabetes.  5. History of prostate cancer and urethral stricture.  6. Normocytic anemia of chronic disease.  7. Transient hypokalemia.   DISCHARGE MEDICATIONS:  1. Avelox 400 mg daily for 5 days.  2. Aspirin 81 mg daily.  3. Albuterol nebulizers with Atrovent, i.e. DuoNeb 3 mL every 3 hours.  4. Oxygen 2 L/M at home.  5. Klonopin 2 mg t.i.d.  6. Singulair 10 mg daily.  7. TriCor 145 mg b.i.d.  8. Spiriva HandiHaler 1 puff daily.   DISPOSITION:  The patient will be discharged home with home health  PT/OT.  He is also to be on home oxygen at 2 L/M.  He will complete  antibiotic therapy and follow-up with Dr. Electa Sniff within the next week.   PROCEDURES PERFORMED THIS ADMISSION:  Include a chest x-ray on  April25 that shows probable basilar pneumonia superimposed on chronic  interstitial opacities and fibrosis.   CONSULTATIONS:  None.   BRIEF HISTORY AND PHYSICAL:  Please refer to dictated history and  physical on admission by Dr. Christella Noa.  In short, however, this is an 77-  year-old gentleman that came in with complaint of a fever for 1 day.  He  had baseline history of COPD and also home oxygen.  In the ED, chest x-  ray was consistent with bibasilar pneumonia.  His clinical findings were  also consistent with acute pneumonia with a fever of 101.2, pulse rate  of 130, and respiratory rate of 20.  His oxygen sats on 2 L was  93.  His  labs showed slightly leukopenia.  Otherwise, no significant findings.   HOSPITAL COURSE:  1. Bibasilar pneumonia.  The patient was admitted, started on Rocephin      and Zithromax intravenously.  His fever literally disappeared.  The      patient has improved tremendously.  At this point, he has been weak      so we have gotten PT/OT on him and we will continue with that at      home.  2. History of COPD.  The patient did not have any acute exacerbation      of his COPD in the hospital.  We therefore continued with his      current medication including oxygen.  3. Hypokalemia.  As indicated, the patient's potassium was low,      probably from a combination of albuterol use.  We have repleted his      potassium and  he seems to be stable at this point.  4. Normocytic anemia.  The patient's evaluation of anemia showed that      it is probably due to chronic disease.  His hemoglobin has been      stable between 10 and 11.  5. Presumed diabetes.  This is probably steroid induced.  His blood      sugar seems to be fine.  He was not on any medication for diabetes      so far.      Lonia Blood, M.D.  Electronically Signed     LG/MEDQ  D:  12/26/2006  T:  12/26/2006  Job:  [id number]

## 2011-01-13 NOTE — Op Note (Signed)
NAME:  Roy [name], Roy [name]                 ACCOUNT NO.:  [id number]   MEDICAL RECORD NO.:  [id number]          PATIENT TYPE:  AMB   LOCATION:  NESC                         FACILITY:  [facility]   PHYSICIAN:  Claudette Laws, M.D.  DATE OF BIRTH:  1920/10/24   DATE OF PROCEDURE:  03/02/2005  DATE OF DISCHARGE:                                 OPERATIVE REPORT   PREOPERATIVE DIAGNOSIS:  Urethral stricture.   POSTOPERATIVE DIAGNOSIS:  Urethral stricture.   PROCEDURE:  Direct visual urethrotomy.   SURGEON:  Claudette Laws, M.D.   ASSISTANT:  Glade Nurse, M.D.   ANESTHESIA:  General endotracheal.   INDICATIONS FOR PROCEDURE:  This is a gentleman seen in consultation for  voiding symptoms. The patient has a past medical history of prostatic cancer  status post brachytherapy. After evaluation, he was found to have a high  grade bulbar stricture. After reviewing his options, he has elected to  proceed with surgical management.   DESCRIPTION OF PROCEDURE:  The patient was identified by his wrist bracelet  and brought to the operative suite where he received preoperative  antibiotics and general anesthesia. He was prepped and draped in the usual  sterile fashion. Next, we inserted a 22 French rigid sheath with a 12 degree  lens in the urethrotome. His anterior urethra to the level of the bulbar  urethra was normal without any mucosal abnormality or foreign body. At the  level of his bulbar urethra, there was a high grade approximately 2 French  stricture. We were able to pass through the scope a guidewire through the  urethral stricture without any resistance. We then backloaded the  urethrotome off the wire and reinserted it next to the wire. Next, the  patient underwent DVIU cutting with a cold knife at the 12 o'clock position.  The urethral stricture opened up with some difficulty. The scarring was  fairly dense. There was no problems with bleeding. The urethral stricture  was longer than  anticipated and was approximately a half centimeter in  length. Once opening it to approximately 22 [place], we were able to  visualize the membranous urethra. We then placed the scope through the  prostatic urethra which was without abnormality into the bladder which was  mildly trabeculated. Bilateral ureteral orifices were identified and seemed  to be effluxing clear urine bilaterally. There were no mucosal abnormalities  or filling defects other than the aforementioned trabeculation. We then  removed the urethrotome and inspected the stricture site again which was  found to be hemostatic. Next, we removed the scope and over the guidewire,  the stricture was sequentially dilated with Heyman dilators starting at 35  [place] and extending to 24 [place]. Next, an 76 French Councill tip catheter  was placed over the wire to the level of the urinary bladder. Excellent  clear urine output was obtained. The guidewire was removed and the Foley  catheter was inflated  with 10 mL of sterile water. It was connected to a drainage bag. The patient  was reversed from anesthesia which he tolerated well without complications.  Please note  Dr. Javier Glazier was present and participated in this entire  case. The patient will followup in approximately one week for __________.       MT/MEDQ  D:  03/02/2005  T:  03/02/2005  Job:  [id number]

## 2011-01-13 NOTE — Consult Note (Signed)
NAME:  Roy [name], Roy [name] NO.:  [id number]   MEDICAL RECORD NO.:  [id number]          PATIENT TYPE:  EMS   LOCATION:  ED                           FACILITY:  [facility]   PHYSICIAN:  Alessandra Bevels. Chase Picket, FNP-CDATE OF BIRTH:  [birth year]   DATE OF CONSULTATION:  DATE OF DISCHARGE:                                   CONSULTATION   EMERGENCY ROOM CONSULTATION   The patient was seen with difficulty urinating and hematuria.   HISTORY:  History of prostate cancer receiving radiation therapy in the  past.  He is a patient of Dr. Etta Grandchild.  He has a history of __________urethral  stricture disease last dilated in May of 2007.  He was seen in the ER last  night for same symptoms, however, voided a fairly large amount  spontaneously.  Catheter was then placed with minimal postvoid residual, and  it was discussed with the patient about leaving the catheter in or taking it  out, and it was elected to be taken out.  The patient's symptoms since being  home, he has been unable to urinate and is back again beginning to have  significant suprapubic discomfort.  He has seen some blood in the urine.   PAST MEDICAL HISTORY:  1.  Tremors.  2.  He has known bronchitis.   SOCIAL HISTORY:  He lives next door to his daughter and son-in-law.  He is a  nonsmoker, an occasional drink, and no drug abuse.   MEDICATIONS:  Clonazepam, Singulair, Detrol, Tricor, hydrochlorothiazide,  Triamterene, Spiriva, Aminex, and Tylenol.   REVIEW OF SYSTEMS:  Benign.   PHYSICAL EXAMINATION:  VITAL SIGNS:  Temp 96.5, pulse 86, respirations 16,  and blood pressure 113/71.  GENERAL:  Ill-appearing, mild tremor, 77 year old male, who is in no acute  distress.  He is alert and oriented x3, he is accompanied by son-in-law.  NECK:  Negative JVD, negative thyromegaly.  CHEST:  Somewhat diaphragmatic motion.  ABDOMEN:  Soft, mildly tender in the suprapubic area.  GU:  Penis, meatus without abnormalities.  No scrotal  tenderness.  EXTREMITIES:  Within normal limits.  SKIN:  Warm and dry.  NEUROLOGIC:  He does have some speech slurring.   PROCEDURE:  An 18-French coude was initially attempted by Cammy Copa, Nurse Practitioner, with resistance met.  Dr. Earlene Plater was then  notified.  Dr. Earlene Plater then attempted a 16-French Foley catheter with  difficulty.  He then performed urethral dilatation with filiform and  followers with 14, 16, and 18-French.  This was done without difficulty.  The 16-French was then easily placed in the urethra with good urine return.  Again, this was performed by Dr. Darvin Neighbours.   IMPRESSION:  Urinary retention status post prostate cancer, urethral  stricture disease.   PLAN:  Remain on Foley catheter, Cipro 500 mg one p.o. b.i.d. with  prescription given, follow up with Dr. Etta Grandchild next week.           ______________________________  Alessandra Bevels. Chase Picket, FNP-C     JML/MEDQ  D:  03/10/2006  T:  03/10/2006  Job:  [id number]   cc:   Claudette Laws, M.D.  Fax: [phone]

## 2011-01-13 NOTE — Discharge Summary (Signed)
NAME:  Roy [name], Roy [name]                 ACCOUNT NO.:  [id number]   MEDICAL RECORD NO.:  [id number]          PATIENT TYPE:  INP   LOCATION:  1418                         FACILITY:  [facility]   PHYSICIAN:  Herbie Saxon, MDDATE OF BIRTH:  [birth year]   DATE OF ADMISSION:  11/19/2006  DATE OF DISCHARGE:                               DISCHARGE SUMMARY   DISCHARGE DIAGNOSES:  COPD exacerbation.  hyperlipidemia.tremors   HOSPITAL COURSE:  This is aN 78 year male admitted via  emergency room  with a history of cough,and whheezingx1week with increasing difficulty  with breathing chest x-ray  showed bilateral interstitial opacities  suggesting idiopathic pulmonary fibrosis and COPD changes.  The patient  was started on IV fluid hydration,iv steroids,bronchodilators by  nebuliser and iv avelox with intrAnasal oxygren.he has improved  considerably and he is being sent home on home oxygen  activity as tolerated,  diet heart healthy low salt low cholesterol  condition stable  follow up with PCP  IN 1 week   medications  prednisone taper 40 mg qd x 1week    30  ''  ''    20    10  Duonebs 3 ml hhn q 6hrly  avelox 400mg  daily  oxygen 2l by nasal cannula  klonopin 2mg   tid   examination  elderly man ,not in acutre distress  HEENT  PERLA  Tremors at rest[hand]  no cyanosis  neck supple  chest  few rhonchi  abdomen benign  hearts sounds 1&2 regular  AOX3  CN 1-12  intact  no pedal edema   follow up  DR ZOXWR  PULMONOLOGIST  2 WEEKS      Herbie Saxon, MD  Electronically Signed     MIO/MEDQ  D:  11/23/2006  T:  11/23/2006  Job:  [id number]

## 2011-01-30 ENCOUNTER — Ambulatory Visit (HOSPITAL_COMMUNITY)
Admission: RE | Admit: 2011-01-30 | Discharge: 2011-01-30 | Disposition: A | Discharge status: Home / Self Care | Payer: Medicare Other | Source: Ambulatory Visit | Attending: Radiation Oncology | Admitting: Radiation Oncology

## 2011-01-30 ENCOUNTER — Encounter (HOSPITAL_COMMUNITY): Payer: Self-pay

## 2011-01-30 DIAGNOSIS — C61 Malignant neoplasm of prostate: Secondary | ICD-10-CM | POA: Insufficient documentation

## 2011-01-30 DIAGNOSIS — R0602 Shortness of breath: Secondary | ICD-10-CM | POA: Insufficient documentation

## 2011-01-30 DIAGNOSIS — I2789 Other specified pulmonary heart diseases: Secondary | ICD-10-CM | POA: Insufficient documentation

## 2011-01-30 DIAGNOSIS — J984 Other disorders of lung: Secondary | ICD-10-CM | POA: Insufficient documentation

## 2011-01-30 DIAGNOSIS — C3490 Malignant neoplasm of unspecified part of unspecified bronchus or lung: Secondary | ICD-10-CM | POA: Insufficient documentation

## 2011-01-30 DIAGNOSIS — I2510 Atherosclerotic heart disease of native coronary artery without angina pectoris: Secondary | ICD-10-CM | POA: Insufficient documentation

## 2011-02-02 ENCOUNTER — Ambulatory Visit: Payer: Medicare Other | Admitting: Radiation Oncology

## 2011-02-03 ENCOUNTER — Ambulatory Visit
Admission: RE | Admit: 2011-02-03 | Discharge: 2011-02-03 | Disposition: A | Discharge status: Home / Self Care | Payer: Medicare Other | Source: Ambulatory Visit | Attending: Radiation Oncology | Admitting: Radiation Oncology

## 2011-02-03 ENCOUNTER — Other Ambulatory Visit: Payer: Self-pay | Admitting: Radiation Oncology

## 2011-02-03 DIAGNOSIS — C3430 Malignant neoplasm of lower lobe, unspecified bronchus or lung: Secondary | ICD-10-CM

## 2011-02-15 ENCOUNTER — Emergency Department (HOSPITAL_COMMUNITY)
Admission: EM | Admit: 2011-02-15 | Discharge: 2011-02-16 | Disposition: A | Discharge status: Home / Self Care | Payer: Medicare Other | Attending: Emergency Medicine | Admitting: Emergency Medicine

## 2011-02-15 DIAGNOSIS — Z8546 Personal history of malignant neoplasm of prostate: Secondary | ICD-10-CM | POA: Insufficient documentation

## 2011-02-15 DIAGNOSIS — R339 Retention of urine, unspecified: Secondary | ICD-10-CM | POA: Insufficient documentation

## 2011-02-16 LAB — URINE CULTURE

## 2011-02-16 LAB — URINALYSIS, ROUTINE W REFLEX MICROSCOPIC
Bilirubin Urine: NEGATIVE
Glucose, UA: NEGATIVE mg/dL
Nitrite: NEGATIVE
Specific Gravity, Urine: 1.019 (ref 1.005–1.030)
pH: 6.5 (ref 5.0–8.0)

## 2011-02-16 LAB — URINE MICROSCOPIC-ADD ON

## 2011-05-22 LAB — URINALYSIS, ROUTINE W REFLEX MICROSCOPIC
Glucose, UA: NEGATIVE
Ketones, ur: NEGATIVE
pH: 6

## 2011-05-22 LAB — POCT I-STAT, CHEM 8
BUN: 22
Calcium, Ion: 1.23
Chloride: 106

## 2011-05-22 LAB — URINE MICROSCOPIC-ADD ON

## 2011-05-23 LAB — URINE CULTURE: Culture: NO GROWTH

## 2011-05-23 LAB — URINALYSIS, ROUTINE W REFLEX MICROSCOPIC
Ketones, ur: NEGATIVE
Nitrite: NEGATIVE
Protein, ur: 100 — AB

## 2011-05-26 LAB — POCT I-STAT, CHEM 8
Calcium, Ion: 1.26
HCT: 42
Sodium: 142
TCO2: 28

## 2011-05-26 LAB — URINE CULTURE

## 2011-05-30 LAB — POCT I-STAT 4, (NA,K, GLUC, HGB,HCT)
Glucose, Bld: 95
HCT: 43
Hemoglobin: 14.6

## 2011-06-13 LAB — URINALYSIS, ROUTINE W REFLEX MICROSCOPIC
Nitrite: NEGATIVE
Protein, ur: NEGATIVE
Urobilinogen, UA: 0.2

## 2011-06-14 LAB — URINE CULTURE

## 2011-06-14 LAB — URINALYSIS, ROUTINE W REFLEX MICROSCOPIC
Glucose, UA: NEGATIVE
Ketones, ur: NEGATIVE
Nitrite: NEGATIVE
Protein, ur: >300 — AB

## 2011-08-14 ENCOUNTER — Ambulatory Visit (HOSPITAL_COMMUNITY)
Admission: RE | Admit: 2011-08-14 | Discharge: 2011-08-14 | Disposition: A | Discharge status: Home / Self Care | Payer: Medicare Other | Source: Ambulatory Visit | Attending: Radiation Oncology | Admitting: Radiation Oncology

## 2011-08-14 DIAGNOSIS — R079 Chest pain, unspecified: Secondary | ICD-10-CM | POA: Insufficient documentation

## 2011-08-14 DIAGNOSIS — I2789 Other specified pulmonary heart diseases: Secondary | ICD-10-CM | POA: Insufficient documentation

## 2011-08-14 DIAGNOSIS — C3430 Malignant neoplasm of lower lobe, unspecified bronchus or lung: Secondary | ICD-10-CM

## 2011-08-14 DIAGNOSIS — C61 Malignant neoplasm of prostate: Secondary | ICD-10-CM | POA: Insufficient documentation

## 2011-08-14 DIAGNOSIS — R059 Cough, unspecified: Secondary | ICD-10-CM | POA: Insufficient documentation

## 2011-08-14 DIAGNOSIS — R0602 Shortness of breath: Secondary | ICD-10-CM | POA: Insufficient documentation

## 2011-08-14 DIAGNOSIS — K8020 Calculus of gallbladder without cholecystitis without obstruction: Secondary | ICD-10-CM | POA: Insufficient documentation

## 2011-08-14 DIAGNOSIS — J984 Other disorders of lung: Secondary | ICD-10-CM | POA: Insufficient documentation

## 2011-08-14 DIAGNOSIS — J438 Other emphysema: Secondary | ICD-10-CM | POA: Insufficient documentation

## 2011-08-14 DIAGNOSIS — R05 Cough: Secondary | ICD-10-CM | POA: Insufficient documentation

## 2011-08-14 DIAGNOSIS — C3490 Malignant neoplasm of unspecified part of unspecified bronchus or lung: Secondary | ICD-10-CM | POA: Insufficient documentation

## 2011-08-15 ENCOUNTER — Encounter: Payer: Self-pay | Admitting: *Deleted

## 2011-08-15 DIAGNOSIS — N200 Calculus of kidney: Secondary | ICD-10-CM | POA: Insufficient documentation

## 2011-08-15 DIAGNOSIS — Z923 Personal history of irradiation: Secondary | ICD-10-CM | POA: Insufficient documentation

## 2011-08-15 DIAGNOSIS — C61 Malignant neoplasm of prostate: Secondary | ICD-10-CM | POA: Insufficient documentation

## 2011-08-15 DIAGNOSIS — C3490 Malignant neoplasm of unspecified part of unspecified bronchus or lung: Secondary | ICD-10-CM | POA: Insufficient documentation

## 2011-08-15 DIAGNOSIS — J8410 Pulmonary fibrosis, unspecified: Secondary | ICD-10-CM | POA: Insufficient documentation

## 2011-08-17 ENCOUNTER — Ambulatory Visit: Payer: Medicare Other | Admitting: Radiation Oncology

## 2011-08-23 ENCOUNTER — Telehealth: Payer: Self-pay | Admitting: Emergency Medicine

## 2011-08-23 NOTE — Telephone Encounter (Signed)
ATC NA WCB 

## 2011-08-24 ENCOUNTER — Ambulatory Visit
Admission: RE | Admit: 2011-08-24 | Discharge: 2011-08-24 | Disposition: A | Discharge status: Home / Self Care | Payer: Medicare Other | Source: Ambulatory Visit | Attending: Radiation Oncology | Admitting: Radiation Oncology

## 2011-08-24 ENCOUNTER — Encounter: Payer: Self-pay | Admitting: Radiation Oncology

## 2011-08-24 ENCOUNTER — Ambulatory Visit: Payer: Medicare Other | Admitting: Radiation Oncology

## 2011-08-24 VITALS — BP 109/69 | HR 93 | Temp 97.6°F | Resp 22 | Wt 147.4 lb

## 2011-08-24 DIAGNOSIS — C3490 Malignant neoplasm of unspecified part of unspecified bronchus or lung: Secondary | ICD-10-CM

## 2011-08-24 NOTE — Progress Notes (Signed)
Pt has occass  prod cough, yellow sputum. Dyspnea w/exertion, worse w/ damp weather. Denies pain.  O2 @2L /min nightly.  States appetite good, Ensure 2 x/day.

## 2011-08-24 NOTE — Progress Notes (Signed)
Radiation Oncology         [phone] ________________________________  Name: Roy [name] MRN: [id number]  Date: 08/24/2011  DOB: Jun 19, 1921       Follow-Up Visit Note  CC: No primary provider on file.  Crecencio Mc, MD  Diagnosis:   77 yo man with clinical stage I Non-small cell lung cancer  Interval Since Last Radiation:  2.5 years  Narrative . . . . . . . . The patient returns today for routine follow-up.  No further chest wall pain, and not using neurontin.  Breathing stable.  Occasional dry cough.                              ALLERGIES:  is allergic to iodine and sulfonamide derivatives.  Meds. . . . . . . . . . . . Current Outpatient Prescriptions  Medication Sig Dispense Refill  . aspirin EC 81 MG tablet Take 81 mg by mouth daily.        . clonazePAM (KLONOPIN) 2 MG tablet Take 2 mg by mouth 2 (two) times daily as needed.        . fenofibrate (TRICOR) 145 MG tablet Take 145 mg by mouth daily.        . fish oil-omega-3 fatty acids 1000 MG capsule Take 2 g by mouth daily.        . montelukast (SINGULAIR) 10 MG tablet Take 10 mg by mouth at bedtime.        . Multiple Vitamin (MULTIVITAMIN) capsule Take 1 capsule by mouth daily.        Marland Kitchen tiotropium (SPIRIVA) 18 MCG inhalation capsule Place 18 mcg into inhaler and inhale daily.        Marland Kitchen triamcinolone (NASACORT) 55 MCG/ACT nasal inhaler Place 2 sprays into the nose daily.       . trospium (SANCTURA) 20 MG tablet Take 60 mg by mouth 2 (two) times daily.          Physical Findings. . .  weight is 147 lb 6.4 oz (66.86 kg). His axillary temperature is 97.6 F (36.4 C). His blood pressure is 109/69 and his pulse is 93. His respiration is 22 and oxygen saturation is 92%. .  No significant changes to skin in radiation field.  No chest wall tenderness.  Lab Findings . . . . .  Lab Results  Component Value Date   WBC 5.3 08/01/2010   HGB 13.3 10/27/2010   HCT 39.0 10/27/2010   MCV 88.4 08/01/2010   PLT 125* 08/01/2010     X-Ray  Findings. . . . Ct Chest Wo Contrast  08/14/2011  *RADIOLOGY REPORT*  Clinical Data: Lung cancer.  Prostate cancer.  Chest pain, cough and shortness of breath.  CT CHEST WITHOUT CONTRAST  Technique:  Multidetector CT imaging of the chest was performed following the standard protocol without IV contrast.  Comparison: 01/30/2011.  Findings: There are calcified mediastinal and right hilar lymph nodes.  Noncalcified mediastinal lymph nodes are not enlarged by CT size criteria.  No axillary adenopathy.  Hilar regions are difficult to definitively evaluate without IV contrast.  Pulmonary arteries are enlarged.  Heart size normal.  Biapical pleural parenchymal scarring.  Emphysema.  There is a peripheral and basilar predominant pattern of subpleural reticulation, architectural distortion and honeycombing.  Airspace consolidation is seen in the posterior medial right hemithorax, along the major fissure, stable.  Associated irregularity of the right bronchial tree.  No pleural fluid.  Incidental imaging of the upper abdomen shows stones in the gallbladder.  There are several low attenuation lesions in the kidneys bilaterally, measuring up to 5.0 cm on the right.  A 9 mm retrocrural lymph node is unchanged.  No worrisome lytic or sclerotic lesions.  Probable scattered bone islands in the spine.  IMPRESSION:  1.  Consolidation in the posteromedial right hemithorax, along the major fissure, stable, possibly related to radiation therapy. 2.  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 3.  Pulmonary arterial hypertension. 4.  Cholelithiasis.  Original Report Authenticated By: Reyes Ivan, M.D.    Impression . . . . . . . The patient is stable with no evidence of recurrence.  Plan . . . . . . . . . . . . Repeat chest CT in 6 months then follow-up.  _____________________________________  Artist Pais. Kathrynn Running, M.D.

## 2011-08-24 NOTE — Telephone Encounter (Signed)
Per Mindy, we did not try to contact pt.  Advised pt & he is now trying primary care.  Antionette Fairy

## 2011-08-24 NOTE — Telephone Encounter (Signed)
ATC NA wcb 

## 2011-08-25 ENCOUNTER — Telehealth: Payer: Self-pay | Admitting: *Deleted

## 2011-08-30 ENCOUNTER — Encounter: Payer: Self-pay | Admitting: Internal Medicine

## 2011-09-04 ENCOUNTER — Telehealth: Payer: Self-pay | Admitting: Internal Medicine

## 2011-09-04 NOTE — Telephone Encounter (Signed)
Received copies from [facility] @ [place],on 1.7.13. Forwarded 55 pages to Dr. Jonny Ruiz ,for review.  sj

## 2011-09-05 ENCOUNTER — Encounter: Payer: Self-pay | Admitting: Internal Medicine

## 2011-09-05 ENCOUNTER — Other Ambulatory Visit (INDEPENDENT_AMBULATORY_CARE_PROVIDER_SITE_OTHER): Payer: Medicare Other

## 2011-09-05 ENCOUNTER — Ambulatory Visit (INDEPENDENT_AMBULATORY_CARE_PROVIDER_SITE_OTHER): Payer: Medicare Other | Admitting: Internal Medicine

## 2011-09-05 VITALS — BP 122/60 | HR 74 | Ht 66.0 in | Wt 145.0 lb

## 2011-09-05 DIAGNOSIS — R159 Full incontinence of feces: Secondary | ICD-10-CM

## 2011-09-05 DIAGNOSIS — K5900 Constipation, unspecified: Secondary | ICD-10-CM

## 2011-09-05 LAB — TSH: TSH: 1.82 u[IU]/mL (ref 0.35–5.50)

## 2011-09-05 NOTE — Progress Notes (Signed)
Quick Note:  Let him know his thyroid is working normally. I want to see how stopping the TriCor or fenofibrate helps him or not with his bowel problems. If that is not helpful, he should start taking MiraLax on a daily basis. I would start with a half a tablespoon or capful each day and work up from there to a full dose of a tablespoon or 1 cap each day mixed in 6-8 ounces of water. If that fails to work, he can schedule followup appointment. ______

## 2011-09-05 NOTE — Patient Instructions (Addendum)
Please go to the basement upon leaving today to have your labs done. HOLD Tricor and don't take the Loperamide. We will call you with results and plans.

## 2011-09-05 NOTE — Progress Notes (Signed)
Subjective:    Patient ID: Roy [name], male    DOB: 12/31/1920, 78 y.o.   MRN: [id number]  HPI This man presents for evaluation of fecal incontinence. He says this sometime in the past year he developed a change in his bowel habits. He used to have a stool about every day. He had a speech pathology evaluation due to concern over aspiration and was found to have silent aspiration of thin liquids. That was actually in 2010 and 2011. He was followed by Dr. Kinnie Scales at that time. He went underwent speech therapy and improved. Sometime along the way he was put on thickener and he thinks his bowel habits change then. I guess he grew dissatisfied with Dr. Kinnie Scales, he then saw Dr. Elnoria Howard. His main concern was over the fecal incontinence. Dr. Elnoria Howard recommended Metamucil, but the patient said that caused a lot of burning when he ingested that. He discontinued it doesn't sound like it really helps. He also says that when he stopped the thickener, it did not allow for change back to normal defecation pattern. Dr. Jennye Boroughs notes from February of 2012 indicate that it did.  He is describing a situation where he will not defecate for several days, about 3, and then he'll have a sensation like needing to pass flatus but he ends up having a stool and incontinence. It is typically formed stool. Subsequently he will have some urgent defecation after eating for the next day or so. Then no more defecation for a few days in the cycle repeats.  Allergies  Allergen Reactions  . Iodine   . Sulfonamide Derivatives Nausea And Vomiting   Outpatient Prescriptions Prior to Visit  Medication Sig Dispense Refill  . aspirin EC 81 MG tablet Take 81 mg by mouth daily.        . clonazePAM (KLONOPIN) 2 MG tablet Take 2 mg by mouth 2 (two) times daily as needed.        . fenofibrate (TRICOR) 145 MG tablet Take 145 mg by mouth daily. ON HOLD      . fish oil-omega-3 fatty acids 1000 MG capsule Take 2 g by mouth daily.        . montelukast  (SINGULAIR) 10 MG tablet Take 10 mg by mouth at bedtime.        . Multiple Vitamin (MULTIVITAMIN) capsule Take 1 capsule by mouth daily.        Marland Kitchen tiotropium (SPIRIVA) 18 MCG inhalation capsule Place 18 mcg into inhaler and inhale daily.        Marland Kitchen triamcinolone (NASACORT) 55 MCG/ACT nasal inhaler Place 2 sprays into the nose daily.       . trospium (SANCTURA) 20 MG tablet Take 60 mg by mouth 2 (two) times daily.         Past Medical History  Diagnosis Date  . Anxiety   . Asthma   . Depression   . Kidney stones     hx of   . Prostate cancer 2005    gleason 4+4=8  . Pulmonary fibrosis   . Lung cancer 2010    RLL  . Hx of radiation therapy may 2010 thru june 2010    5 tx, stereotactic radiotherapy  . Tremor, essential   . History of pneumonia   . COPD (chronic obstructive pulmonary disease)   . DM (diabetes mellitus)   . Urethral stricture   . Normocytic anemia   . Hypokalemia   . Diverticulosis   . Allergic rhinitis   .  HLD (hyperlipidemia)   . Emphysema   . Urinary incontinence   . Hemorrhoids    Past Surgical History  Procedure Date  . Appendectomy   . Hdr brachytherapy implant for prostate cancer 2005  . Cataract extraction, bilateral   . Internal urethrotomy 12/09/2008  . Colonoscopy October 2008    Diverticulosis, hemorrhoids, there was a prior history of adenomatous polyps   History   Social History  . Marital Status: Widowed    Spouse Name: N/A    Number of Children: 1  . Years of Education: N/A   Occupational History  . CPA     Retired    Social History Main Topics  . Smoking status: Former Smoker -- 1.0 packs/day for 35 years    Quit date: 08/14/1989  . Smokeless tobacco: Never Used  . Alcohol Use: No     berr occassionally  . Drug Use: No  . Sexually Active: None   Other Topics Concern  . None   Social History Narrative   Daily caffeine    Family History  Problem Relation Age of Onset  . Cancer Brother     prostate  . Colon cancer Sister    . Cancer Father     bladder  . Lung cancer Brother   . Heart disease Brother        Review of Systems Positive for chronic severe essential tremor, urinary leakage after treatment for prostate cancer, decreased hearing, pedal edema and muscle cramps. All other review of systems negative or as above.    Objective:   Physical Exam General:  Well-developed, well-nourished and in no acute distress, he has a severe essential tremor that affects speech Eyes:  anicteric. ENT:   Mouth and posterior pharynx free of lesions. Teeth are in fair repair and passed Neck:   supple w/o thyromegaly or mass.  Lungs: Clear to auscultation bilaterally but breath sounds are markedly reduced throughout Heart:  S1S2, no rubs, murmurs, gallops distant sounds overall Abdomen:  soft, non-tender, no hepatosplenomegaly, hernia, or mass and BS+.  Rectal: Anal wink is absent. A node term looks normal. Resting tone seems slightly reduced. There is a good voluntary    squeeze inappropriate response and descent with Valsalva and bearing down. There is no rectal mass, there is   no fecal impaction, there is a small amount of brown soft stool. Prostate is flat. Lymph:  no cervical or supraclavicular adenopathy. Extremities:   Trace bilateral lower extremity edema Neuro:  A&O x 3.  Psych:  appropriate mood and  affect.   Data Reviewed: Office notes from Dr. Kinnie Scales and Dr. Elnoria Howard, speech pathology reports. Normal CBC August 2012 and a normal comprehensive metabolic panel also. Note that on some of the speech pathology findings they were concerned about the esophageal dysphagia.        Assessment & Plan:   1. Fecal incontinence   2. Constipation    He also has some dysphagia which seems oropharyngeal.   I'm not sure why his bowel habits changed. Perhaps it was the thickener for his fluids but it is persistent a long time. He certainly could have radiation damage from his brachytherapy for prostate cancer leading  to decreased sensation in the rectal area. I'm going to check a TSH as he is complaining of fatigue though that small to effect undoubtedly, but if it were high at might explain some of this. He is not aware of having his thyroid functions checked. I am also going to  have him stop fenofibrate which can constipate. At his age, it is not clear to me that there is much benefit to that. If that does not help, he'll probably need to titrate MiraLax for his constipation issues to see if that makes a difference. He recall taking that when he was hospitalized in the middle of 2011, or least in a nursing home after he had a fall and a shoulder injury.  As far as her dysphagia is concerned, I don't get a good sense that there is esophageal dysphagia from talking to him. I'm not sure what the speech pathologists observed but undoubtedly his 78 year old esophagus does not contract properly. I don't think working that up makes any sense and he agrees.

## 2011-09-11 ENCOUNTER — Telehealth: Payer: Self-pay | Admitting: Internal Medicine

## 2011-09-11 NOTE — Telephone Encounter (Signed)
Left message for patient to call back  

## 2011-09-12 ENCOUNTER — Encounter: Payer: Self-pay | Admitting: Internal Medicine

## 2011-09-12 DIAGNOSIS — Z0000 Encounter for general adult medical examination without abnormal findings: Secondary | ICD-10-CM | POA: Insufficient documentation

## 2011-09-12 DIAGNOSIS — K5900 Constipation, unspecified: Secondary | ICD-10-CM | POA: Insufficient documentation

## 2011-09-12 DIAGNOSIS — Z8701 Personal history of pneumonia (recurrent): Secondary | ICD-10-CM | POA: Insufficient documentation

## 2011-09-12 DIAGNOSIS — E119 Type 2 diabetes mellitus without complications: Secondary | ICD-10-CM | POA: Insufficient documentation

## 2011-09-12 DIAGNOSIS — D649 Anemia, unspecified: Secondary | ICD-10-CM | POA: Insufficient documentation

## 2011-09-12 DIAGNOSIS — G250 Essential tremor: Secondary | ICD-10-CM | POA: Insufficient documentation

## 2011-09-12 DIAGNOSIS — F419 Anxiety disorder, unspecified: Secondary | ICD-10-CM | POA: Insufficient documentation

## 2011-09-12 DIAGNOSIS — E785 Hyperlipidemia, unspecified: Secondary | ICD-10-CM | POA: Insufficient documentation

## 2011-09-12 DIAGNOSIS — R5383 Other fatigue: Secondary | ICD-10-CM | POA: Insufficient documentation

## 2011-09-12 DIAGNOSIS — J309 Allergic rhinitis, unspecified: Secondary | ICD-10-CM | POA: Insufficient documentation

## 2011-09-12 NOTE — Telephone Encounter (Signed)
Left message for patient to call back  

## 2011-09-12 NOTE — Telephone Encounter (Signed)
Patient had questions about the Miralax instructions .  I have reviewed the instructions that are on the TSH labs from 09/05/11.  He will call back for any further questions.

## 2011-09-15 ENCOUNTER — Telehealth: Payer: Self-pay | Admitting: Internal Medicine

## 2011-09-15 NOTE — Telephone Encounter (Signed)
Patient states daily diarrhea and worsening incontinence.  I have asked him to change his Miralax does to every other day.  He will call back for further questions or problems.

## 2011-09-20 ENCOUNTER — Telehealth: Payer: Self-pay | Admitting: Internal Medicine

## 2011-09-21 NOTE — Telephone Encounter (Signed)
Patient continues to have problems with constipation and fecal incontinence.  He wants to discuss again with Dr Leone Payor.  He will come in 10/06/11 to discuss

## 2011-09-28 ENCOUNTER — Ambulatory Visit (INDEPENDENT_AMBULATORY_CARE_PROVIDER_SITE_OTHER): Payer: Medicare Other | Admitting: Internal Medicine

## 2011-09-28 ENCOUNTER — Encounter: Payer: Self-pay | Admitting: Internal Medicine

## 2011-09-28 VITALS — BP 120/62 | HR 102 | Temp 97.0°F | Ht 65.0 in | Wt 144.0 lb

## 2011-09-28 DIAGNOSIS — T148XXA Other injury of unspecified body region, initial encounter: Secondary | ICD-10-CM

## 2011-09-28 DIAGNOSIS — G8929 Other chronic pain: Secondary | ICD-10-CM | POA: Insufficient documentation

## 2011-09-28 DIAGNOSIS — Z860100 Personal history of colon polyps, unspecified: Secondary | ICD-10-CM | POA: Insufficient documentation

## 2011-09-28 DIAGNOSIS — H9191 Unspecified hearing loss, right ear: Secondary | ICD-10-CM

## 2011-09-28 DIAGNOSIS — J449 Chronic obstructive pulmonary disease, unspecified: Secondary | ICD-10-CM

## 2011-09-28 DIAGNOSIS — H9190 Unspecified hearing loss, unspecified ear: Secondary | ICD-10-CM

## 2011-09-28 DIAGNOSIS — F329 Major depressive disorder, single episode, unspecified: Secondary | ICD-10-CM

## 2011-09-28 DIAGNOSIS — F32A Depression, unspecified: Secondary | ICD-10-CM | POA: Insufficient documentation

## 2011-09-28 DIAGNOSIS — IMO0002 Reserved for concepts with insufficient information to code with codable children: Secondary | ICD-10-CM

## 2011-09-28 MED ORDER — CITALOPRAM HYDROBROMIDE 10 MG PO TABS: 10.0000 mg | ORAL_TABLET | Freq: Every day | ORAL | Status: DC

## 2011-09-28 NOTE — Assessment & Plan Note (Signed)
Mild to mod, for citalopram 10 qd,  to f/u any worsening symptoms or concerns, delcines counseling , verified nonsuicidal

## 2011-09-28 NOTE — Progress Notes (Signed)
Subjective:    Patient ID: Roy [name], male    DOB: 13-Oct-1920, 78 y.o.   MRN: [id number]  HPI  Here to f/u; overall doing well for age and co-morbids, he states he is fearful of aspiration problem none recent pna or other symptoms;  Sees Dr Manning/Dr Delton Coombes for Lung Cancer, Dr Leone Payor for Gi concerns, as well as [facility] for f/u prostate Ca.  History adequate for age, though appears somewhat forgetful.  Recnet labs from [facility] at [place] with normal cbc/cmet nov 2012. Uses thickener and watches diet closely to avoid trouble swallowing.  Pt denies chest pain, increased sob or doe, wheezing, orthopnea, PND, increased LE swelling, palpitations, dizziness or syncope. Pt denies new neurological symptoms such as new headache, or facial or extremity weakness or numbness   Pt denies polydipsia, polyuria.  Has had mild worsening depressive symptoms, but no suicidal ideation, or panic, though has ongoing anxiety, not increased recently.  Was tried with med tx x 2 different meds but cant recall names. Has also right ear hearing loss for several wks, ? Wax. Did also have fall with left shoulder dislocation, and now with permenent decr ROM and pain with abuction, has seen ortho in [place], also has postfall left knee pain/swelling severe that resolved.  Has o2 at night - 2L.  Has walker for home, did not bring today.  Did fall against furniture a few days ago with skin tear to left arm, bleeding now stopped, no worsening red/tender/swelling   Pt denies fever, wt loss, night sweats, loss of appetite, or other constitutional symptoms  Past Medical History  Diagnosis Date  . Anxiety   . Asthma   . Depression   . Kidney stones     hx of   . Prostate cancer 2005    gleason 4+4=8  . Pulmonary fibrosis   . Lung cancer 2010    RLL  . Hx of radiation therapy may 2010 thru june 2010    5 tx, stereotactic radiotherapy  . Tremor, essential   . History of pneumonia   . COPD (chronic obstructive  pulmonary disease)   . DM (diabetes mellitus)   . Urethral stricture   . Normocytic anemia   . Hypokalemia   . Diverticulosis   . Allergic rhinitis   . HLD (hyperlipidemia)   . Emphysema   . Urinary incontinence   . Hemorrhoids   . Allergic rhinitis, cause unspecified 09/12/2011  . Constipation 09/12/2011  . Hyperlipidemia 09/12/2011  . Dysphagia 09/12/2011  . Colon polyps 09/28/2011   Past Surgical History  Procedure Date  . Appendectomy   . Hdr brachytherapy implant for prostate cancer 2005  . Cataract extraction, bilateral   . Internal urethrotomy 12/09/2008  . Colonoscopy October 2008    Diverticulosis, hemorrhoids, there was a prior history of adenomatous polyps  . Tonsillectomy 1943    reports that he quit smoking about 22 years ago. He has never used smokeless tobacco. He reports that he does not drink alcohol or use illicit drugs. family history includes Cancer in his brother and father; Colon cancer in his sister; Heart disease in his brother; and Lung cancer in his brother. Allergies  Allergen Reactions  . Iodine   . Sulfonamide Derivatives Nausea And Vomiting   Current Outpatient Prescriptions on File Prior to Visit  Medication Sig Dispense Refill  . AMBULATORY NON FORMULARY MEDICATION Oxygen At bedtime as directed         . aspirin EC 81 MG tablet  Take 81 mg by mouth daily.        . clonazePAM (KLONOPIN) 2 MG tablet Take 2 mg by mouth 2 (two) times daily as needed.        . fish oil-omega-3 fatty acids 1000 MG capsule Take 2 g by mouth daily.        Marland Kitchen loratadine (CLARITIN) 10 MG tablet Take 10 mg by mouth daily.        . montelukast (SINGULAIR) 10 MG tablet Take 10 mg by mouth at bedtime.        . Multiple Vitamin (MULTIVITAMIN) capsule Take 1 capsule by mouth daily.        Marland Kitchen tiotropium (SPIRIVA) 18 MCG inhalation capsule Place 18 mcg into inhaler and inhale daily.        Marland Kitchen triamcinolone (NASACORT) 55 MCG/ACT nasal inhaler Place 2 sprays into the nose daily.         . fenofibrate (TRICOR) 145 MG tablet Take 145 mg by mouth daily. ON HOLD      . trospium (SANCTURA) 20 MG tablet Take 60 mg by mouth 2 (two) times daily.         Review of Systems Review of Systems  Constitutional: Negative for diaphoresis, activity change, appetite change and unexpected weight change.  HENT: Negative for hearing loss, ear pain, facial swelling, mouth sores and neck stiffness.   Eyes: Negative for pain, redness and visual disturbance.  Respiratory: Negative for shortness of breath and wheezing.   Cardiovascular: Negative for chest pain and palpitations.  Gastrointestinal: Negative for diarrhea, blood in stool, abdominal distention and rectal pain.  Genitourinary: Negative for hematuria, flank pain and decreased urine volume.    Objective:   Physical Exam BP 120/62  Pulse 102  Temp(Src) 97 F (36.1 C) (Oral)  Ht 5\' 5"  (1.651 m)  Wt 144 lb (65.318 kg)  BMI 23.96 kg/m2  SpO2 90% Physical Exam  VS noted Constitutional: Pt appears well-developed and well-nourished.  HENT: Head: Normocephalic.  Right Ear: External ear normal.  Left Ear: External ear normal.  Eyes: Conjunctivae and EOM are normal. Pupils are equal, round, and reactive to light.  Neck: Normal range of motion. Neck supple.  Cardiovascular: Normal rate and regular rhythm.   Pulmonary/Chest: Effort normal and breath sounds normal.  Abd:  Soft, NT, non-distended, + BS Neurological: Pt is alert. No cranial nerve deficit. moderate head tremor and voice tremulous noted, o/w motor gross intact Skin: Skin is warm. No erythema. Has skin tear to left mid medial arm approx 3 cm distal to elbow without cellulitis Psychiatric: Pt behavior is normal. Thought content normal. mild dysphoric and nervous today    Assessment & Plan:

## 2011-09-28 NOTE — Assessment & Plan Note (Signed)
Improved with right ear irigation in office today,  to f/u any worsening symptoms or concerns

## 2011-09-28 NOTE — Assessment & Plan Note (Signed)
For neosporin and gauze today, with recommendation to apply/change qd for 7 days

## 2011-09-28 NOTE — Patient Instructions (Addendum)
Your right ear was irrigated of wax today to help with the hearing You had a skin tear to the left arm treated with neosporin and dressing;  Please change your dressing using supplies at the drug store daily for 7 day to help with healing Take all new medications as prescribed - the citalopram 10 mg per day, to help with depression Continue all other medications as before Please return in 3 months as we will likely need further Lab testing done at that time Please keep your appointments with your specialists as you have planned

## 2011-10-01 ENCOUNTER — Encounter: Payer: Self-pay | Admitting: Internal Medicine

## 2011-10-01 NOTE — Assessment & Plan Note (Signed)
stable overall by hx and exam, most recent data reviewed with pt, and pt to continue medical treatment as before SpO2 Readings from Last 3 Encounters:  09/28/11 90%  08/24/11 92%  06/28/10 93%

## 2011-10-06 ENCOUNTER — Ambulatory Visit (INDEPENDENT_AMBULATORY_CARE_PROVIDER_SITE_OTHER): Payer: Medicare Other | Admitting: Internal Medicine

## 2011-10-06 ENCOUNTER — Encounter: Payer: Self-pay | Admitting: Internal Medicine

## 2011-10-06 VITALS — BP 108/56 | HR 88 | Ht 66.0 in | Wt 146.0 lb

## 2011-10-06 DIAGNOSIS — K5900 Constipation, unspecified: Secondary | ICD-10-CM

## 2011-10-06 DIAGNOSIS — R159 Full incontinence of feces: Secondary | ICD-10-CM

## 2011-10-06 DIAGNOSIS — K5909 Other constipation: Secondary | ICD-10-CM

## 2011-10-06 MED ORDER — POLYETHYLENE GLYCOL 3350 17 GM/SCOOP PO POWD: ORAL | Status: DC

## 2011-10-06 NOTE — Patient Instructions (Signed)
Restart the Miralax as directed on your medication list. Follow up to see Dr. Leone Payor as needed.

## 2011-10-06 NOTE — Assessment & Plan Note (Signed)
Try using the MiraLax intermittently and at low dose.

## 2011-10-06 NOTE — Progress Notes (Signed)
Subjective:     Patient ID: Roy [name], male   DOB: 02-Aug-1921, 78 y.o.   MRN: [id number]  HPI This man returns for follow of chronic constipation and fecal incontinence. He was seen last month. We tried MiraLax. It gave him diarrhea despite cutting the dose and frequency. He says that if he takes even a quarter dose of MiraLax he will have a very loose bowel movement within about 30 minutes. He does say that empties in now and he feels okay. He eventually stopped the MiraLax. I also had him stop his fenofibrate because I thought that might constipate him. He denies any abdominal pain. He still has some occasional oropharyngeal dysphagia with liquids. That is really chronic. He continues to relate his change in bowel habits back to when he used thickening or agents because of his dysphagia issues. Past medical history, medications and allergies are reviewed in the EMR database. Review of Systems He has a chronic severe tremor    Objective:   Physical Exam Elderly no acute distress, diffuse tremor    Assessment:     1. Chronic constipation   2. Fecal incontinence    Will try MiraLax about every 2-3 days using a quarter of the dose. He knows that he will do this at home. Hopefully this will promote enough bowel movements to avoid urgent defecation and possible overflow incontinence. He has not had an obvious impaction that I can tell. He may have some disordered defecation related to his prior radiation therapy for prostate cancer.      Plan:     As above using the MiraLax. He will see me as needed. 15 minutes time spent reviewing the situation with the patient today.

## 2011-10-07 ENCOUNTER — Encounter (HOSPITAL_COMMUNITY): Payer: Self-pay | Admitting: Emergency Medicine

## 2011-10-07 ENCOUNTER — Other Ambulatory Visit: Payer: Self-pay

## 2011-10-07 ENCOUNTER — Emergency Department (HOSPITAL_COMMUNITY)
Admission: EM | Admit: 2011-10-07 | Discharge: 2011-10-07 | Disposition: A | Discharge status: Home / Self Care | Payer: Medicare Other | Attending: Emergency Medicine | Admitting: Emergency Medicine

## 2011-10-07 ENCOUNTER — Emergency Department (HOSPITAL_COMMUNITY): Payer: Medicare Other

## 2011-10-07 DIAGNOSIS — M7989 Other specified soft tissue disorders: Secondary | ICD-10-CM

## 2011-10-07 DIAGNOSIS — M25476 Effusion, unspecified foot: Secondary | ICD-10-CM | POA: Insufficient documentation

## 2011-10-07 DIAGNOSIS — Z8546 Personal history of malignant neoplasm of prostate: Secondary | ICD-10-CM | POA: Insufficient documentation

## 2011-10-07 DIAGNOSIS — Z87442 Personal history of urinary calculi: Secondary | ICD-10-CM | POA: Insufficient documentation

## 2011-10-07 DIAGNOSIS — Z85118 Personal history of other malignant neoplasm of bronchus and lung: Secondary | ICD-10-CM | POA: Insufficient documentation

## 2011-10-07 DIAGNOSIS — F341 Dysthymic disorder: Secondary | ICD-10-CM | POA: Insufficient documentation

## 2011-10-07 DIAGNOSIS — L0390 Cellulitis, unspecified: Secondary | ICD-10-CM

## 2011-10-07 DIAGNOSIS — M25579 Pain in unspecified ankle and joints of unspecified foot: Secondary | ICD-10-CM | POA: Insufficient documentation

## 2011-10-07 DIAGNOSIS — R0989 Other specified symptoms and signs involving the circulatory and respiratory systems: Secondary | ICD-10-CM | POA: Insufficient documentation

## 2011-10-07 DIAGNOSIS — G252 Other specified forms of tremor: Secondary | ICD-10-CM | POA: Insufficient documentation

## 2011-10-07 DIAGNOSIS — Z7982 Long term (current) use of aspirin: Secondary | ICD-10-CM | POA: Insufficient documentation

## 2011-10-07 DIAGNOSIS — M25519 Pain in unspecified shoulder: Secondary | ICD-10-CM | POA: Insufficient documentation

## 2011-10-07 DIAGNOSIS — R05 Cough: Secondary | ICD-10-CM | POA: Insufficient documentation

## 2011-10-07 DIAGNOSIS — M25473 Effusion, unspecified ankle: Secondary | ICD-10-CM | POA: Insufficient documentation

## 2011-10-07 DIAGNOSIS — M79609 Pain in unspecified limb: Secondary | ICD-10-CM

## 2011-10-07 DIAGNOSIS — G8929 Other chronic pain: Secondary | ICD-10-CM | POA: Insufficient documentation

## 2011-10-07 DIAGNOSIS — E785 Hyperlipidemia, unspecified: Secondary | ICD-10-CM | POA: Insufficient documentation

## 2011-10-07 DIAGNOSIS — G250 Essential tremor: Secondary | ICD-10-CM | POA: Insufficient documentation

## 2011-10-07 DIAGNOSIS — L299 Pruritus, unspecified: Secondary | ICD-10-CM | POA: Insufficient documentation

## 2011-10-07 DIAGNOSIS — R059 Cough, unspecified: Secondary | ICD-10-CM | POA: Insufficient documentation

## 2011-10-07 DIAGNOSIS — L02419 Cutaneous abscess of limb, unspecified: Secondary | ICD-10-CM | POA: Insufficient documentation

## 2011-10-07 DIAGNOSIS — J438 Other emphysema: Secondary | ICD-10-CM | POA: Insufficient documentation

## 2011-10-07 DIAGNOSIS — R0602 Shortness of breath: Secondary | ICD-10-CM | POA: Insufficient documentation

## 2011-10-07 DIAGNOSIS — Z79899 Other long term (current) drug therapy: Secondary | ICD-10-CM | POA: Insufficient documentation

## 2011-10-07 LAB — URINALYSIS, ROUTINE W REFLEX MICROSCOPIC
Ketones, ur: NEGATIVE mg/dL
Leukocytes, UA: NEGATIVE
Nitrite: NEGATIVE
Protein, ur: NEGATIVE mg/dL
Urobilinogen, UA: 0.2 mg/dL (ref 0.0–1.0)

## 2011-10-07 LAB — COMPREHENSIVE METABOLIC PANEL
Alkaline Phosphatase: 101 U/L (ref 39–117)
BUN: 21 mg/dL (ref 6–23)
Chloride: 105 mEq/L (ref 96–112)
GFR calc Af Amer: 83 mL/min — ABNORMAL LOW (ref >90)
GFR calc non Af Amer: 71 mL/min — ABNORMAL LOW (ref >90)
Glucose, Bld: 75 mg/dL (ref 70–99)
Potassium: 4 mEq/L (ref 3.5–5.1)
Total Bilirubin: 0.4 mg/dL (ref 0.3–1.2)

## 2011-10-07 LAB — DIFFERENTIAL
Lymphs Abs: 0.6 10*3/uL — ABNORMAL LOW (ref 0.7–4.0)
Monocytes Absolute: 0.5 10*3/uL (ref 0.1–1.0)
Monocytes Relative: 9 % (ref 3–12)
Neutro Abs: 3.9 10*3/uL (ref 1.7–7.7)
Neutrophils Relative %: 74 % (ref 43–77)

## 2011-10-07 LAB — CBC
HCT: 36.5 % — ABNORMAL LOW (ref 39.0–52.0)
Hemoglobin: 11.4 g/dL — ABNORMAL LOW (ref 13.0–17.0)
MCH: 27.7 pg (ref 26.0–34.0)
RBC: 4.11 MIL/uL — ABNORMAL LOW (ref 4.22–5.81)

## 2011-10-07 LAB — CARDIAC PANEL(CRET KIN+CKTOT+MB+TROPI): Relative Index: 2.9 — ABNORMAL HIGH (ref 0.0–2.5)

## 2011-10-07 MED ORDER — CLINDAMYCIN PHOSPHATE 600 MG/50ML IV SOLN
600.0000 mg | Freq: Once | INTRAVENOUS | Status: AC
  Administered 2011-10-07: 600 mg via INTRAVENOUS
  Filled 2011-10-07: qty 50

## 2011-10-07 MED ORDER — CLINDAMYCIN HCL 300 MG PO CAPS: 300.0000 mg | ORAL_CAPSULE | Freq: Three times a day (TID) | ORAL | Status: AC

## 2011-10-07 NOTE — ED Notes (Signed)
Pt is alert and oriented with respirations even and unlabored.  NAD at this time.  Discharge instructions reviewed with pt and pt verbalized understanding.  Pt family member to transport pt home.

## 2011-10-07 NOTE — ED Provider Notes (Signed)
History     CSN: [id number]  Arrival date & time 10/07/11  1445   First MD Initiated Contact with Patient 10/07/11 1503      Chief Complaint  Patient presents with  . Ankle Pain    right ankle     (Consider location/radiation/quality/duration/timing/severity/associated sxs/prior treatment) Patient is a 78 y.o. male presenting with ankle pain. The history is provided by the patient.  Ankle Pain  The incident occurred more than 2 days ago. There was no injury mechanism. The pain is present in the right ankle. Pertinent negatives include no numbness.   Pt has chronic bl ankle swelling with some redness. Pt noticed 36 hr ago that swelling and redness worsened significantly associated with mild itching. No fever chills, N/V. No recent injury. Pt denies SOB but has persistent cough productive of yellow sputum.  Past Medical History  Diagnosis Date  . Anxiety   . Asthma   . Depression   . Kidney stones     hx of   . Prostate cancer 2005    gleason 4+4=8  . Pulmonary fibrosis   . Lung cancer 2010    RLL  . Hx of radiation therapy may 2010 thru june 2010    5 tx, stereotactic radiotherapy  . Tremor, essential   . History of pneumonia   . COPD (chronic obstructive pulmonary disease)   . DM (diabetes mellitus)   . Urethral stricture   . Normocytic anemia   . Hypokalemia   . Diverticulosis   . Allergic rhinitis   . HLD (hyperlipidemia)   . Emphysema   . Urinary incontinence   . Hemorrhoids   . Hyperlipidemia 09/12/2011  . Adenomatous colon polyp   . Chronic left shoulder pain 09/28/2011    Past Surgical History  Procedure Date  . Appendectomy   . Hdr brachytherapy implant for prostate cancer 2005  . Cataract extraction, bilateral   . Internal urethrotomy 12/09/2008  . Colonoscopy October 2008    Diverticulosis, hemorrhoids  . Tonsillectomy 1943    Family History  Problem Relation Age of Onset  . Prostate cancer Brother   . Colon cancer Sister   . Cancer Father    bladder  . Lung cancer Brother   . Heart disease Brother     History  Substance Use Topics  . Smoking status: Former Smoker -- 1.0 packs/day for 35 years    Quit date: 08/14/1989  . Smokeless tobacco: Never Used  . Alcohol Use: No     beer occassionally      Review of Systems  Constitutional: Negative for fever and chills.  Respiratory: Positive for cough. Negative for shortness of breath.   Cardiovascular: Negative for chest pain.  Gastrointestinal: Negative for nausea, vomiting and abdominal pain.  Genitourinary: Negative for frequency.  Musculoskeletal: Positive for joint swelling.  Skin: Positive for color change. Negative for wound.  Neurological: Negative for weakness, light-headedness and numbness.    Allergies  Sulfonamide derivatives  Home Medications   Current Outpatient Rx  Name Route Sig Dispense Refill  . AMBULATORY NON FORMULARY MEDICATION  Oxygen At bedtime as directed       . ASPIRIN EC 81 MG PO TBEC Oral Take 81 mg by mouth daily.      Marland Kitchen VITAMIN D 1000 UNITS PO TABS Oral Take 1,000 Units by mouth daily.    Marland Kitchen CIPROFLOXACIN HCL 250 MG PO TABS Oral Take 250 mg by mouth 2 (two) times daily. Course started 10/05/11 for 7 days    .  CLONAZEPAM 2 MG PO TABS Oral Take 2 mg by mouth 2 (two) times daily.     . OMEGA-3 FATTY ACIDS 1000 MG PO CAPS Oral Take 2 g by mouth daily.      Marland Kitchen MONTELUKAST SODIUM 10 MG PO TABS Oral Take 10 mg by mouth at bedtime.      . MULTIVITAMINS PO CAPS Oral Take 1 capsule by mouth daily.      Marland Kitchen TIOTROPIUM BROMIDE MONOHYDRATE 18 MCG IN CAPS Inhalation Place 18 mcg into inhaler and inhale daily.      . TRIAMCINOLONE ACETONIDE 55 MCG/ACT NA INHA Nasal Place 2 sprays into the nose daily as needed.     Marland Kitchen CLINDAMYCIN HCL 300 MG PO CAPS Oral Take 1 capsule (300 mg total) by mouth 3 (three) times daily. 30 capsule 0    BP 116/65  Pulse 79  Temp(Src) 97.5 F (36.4 C) (Oral)  Resp 19  SpO2 92%  Physical Exam  Nursing note and vitals  reviewed. Constitutional: He is oriented to person, place, and time. He appears well-developed and well-nourished. No distress.  HENT:  Head: Normocephalic and atraumatic.  Mouth/Throat: Oropharynx is clear and moist.  Eyes: EOM are normal. Pupils are equal, round, and reactive to light.  Neck: Normal range of motion. Neck supple.  Cardiovascular: Normal rate and regular rhythm.   Pulmonary/Chest: Effort normal. No respiratory distress. He has no wheezes. He has rales (throughout both lung fields).  Abdominal: Soft. Bowel sounds are normal. There is no tenderness. There is no rebound and no guarding.  Musculoskeletal: Normal range of motion. He exhibits edema (pt has +2 R ankle edema with redness and warmth on anterior ankle. 2+ pedal pulses. No calf tenderness. L ankle with 1+ edema and minimal redness. No calf tenderness). He exhibits no tenderness.  Neurological: He is alert and oriented to person, place, and time.       Chronic tremor. Unchanged.   Skin: Skin is warm and dry. No rash noted. There is erythema.  Psychiatric: He has a normal mood and affect. His behavior is normal.    ED Course  Procedures (including critical care time)  Labs Reviewed  CBC - Abnormal; Notable for the following:    RBC 4.11 (*)    Hemoglobin 11.4 (*)    HCT 36.5 (*)    Platelets 124 (*)    All other components within normal limits  DIFFERENTIAL - Abnormal; Notable for the following:    Lymphs Abs 0.6 (*)    All other components within normal limits  COMPREHENSIVE METABOLIC PANEL - Abnormal; Notable for the following:    Albumin 3.1 (*)    GFR calc non Af Amer 71 (*)    GFR calc Af Amer 83 (*)    All other components within normal limits  CARDIAC PANEL(CRET KIN+CKTOT+MB+TROPI) - Abnormal; Notable for the following:    Relative Index 2.9 (*)    All other components within normal limits  URINALYSIS, ROUTINE W REFLEX MICROSCOPIC  PRO B NATRIURETIC PEPTIDE   Dg Chest 2 View  10/07/2011  *RADIOLOGY  REPORT*  Clinical Data: Breath, cough, history of lung cancer  CHEST - 2 VIEW  Comparison: CT of the chest 08/14/2011  Findings: Cardiomediastinal silhouette is stable.  Stable bilateral emphysematous and peripheral fibrotic changes.  Again noted patchy consolidation in the right lower lobe posteromedial consistent with chronic postradiation changes.  No definite new infiltrate or pulmonary edema.  IMPRESSION:  Stable bilateral emphysematous and peripheral fibrotic changes. Again  noted patchy consolidation in the right lower lobe posteromedial consistent with chronic postradiation changes.  No definite new infiltrate or pulmonary edema.  Original Report Authenticated By: Natasha Mead, M.D.     1. Cellulitis       MDM    Given lack of constitutional symptoms, normal VS, normal venous doppler and no elevated marker for infection, I believe pt can safely be d/c'd home to f/u with his PMD on Monday. He has been given strict instructions to return for worsening pain, fever, redness, swelling or any concerns. Will treat as cellulitis though may represent venous stasis.        Loren Racer, MD 10/07/11 [phone]

## 2011-10-07 NOTE — ED Notes (Signed)
Pt c/o swelling to both legs, right worse than left.  Pt also with redness to leg just above right ankle. Pt denies pain.

## 2011-10-07 NOTE — Progress Notes (Signed)
VASCULAR LAB PRELIMINARY  PRELIMINARY  PRELIMINARY  PRELIMINARY  Right lower extremity venous Doppler completed.    Preliminary report:  There is no DVT or SVT noted in the right lower extremity.  There is interstitial fluid noted in the right lower leg, etiology unknown.  Sherren Kerns [place], 10/07/2011, 5:02 PM

## 2011-10-11 ENCOUNTER — Encounter: Payer: Self-pay | Admitting: Internal Medicine

## 2011-10-11 ENCOUNTER — Ambulatory Visit (INDEPENDENT_AMBULATORY_CARE_PROVIDER_SITE_OTHER): Payer: Medicare Other | Admitting: Internal Medicine

## 2011-10-11 VITALS — BP 110/70 | HR 101 | Temp 97.0°F | Ht 66.0 in | Wt 147.4 lb

## 2011-10-11 DIAGNOSIS — F419 Anxiety disorder, unspecified: Secondary | ICD-10-CM

## 2011-10-11 DIAGNOSIS — L03115 Cellulitis of right lower limb: Secondary | ICD-10-CM

## 2011-10-11 DIAGNOSIS — L03119 Cellulitis of unspecified part of limb: Secondary | ICD-10-CM

## 2011-10-11 DIAGNOSIS — E119 Type 2 diabetes mellitus without complications: Secondary | ICD-10-CM

## 2011-10-11 DIAGNOSIS — F411 Generalized anxiety disorder: Secondary | ICD-10-CM

## 2011-10-11 NOTE — Progress Notes (Signed)
Subjective:    Patient ID: Roy [name], male    DOB: 06/30/1921, 78 y.o.   MRN: [id number]  HPI Here to f/u after being seen in ER feb 9, and tx for RLE cellulitis after doppler neg for DVT;  No fever and wbc normal at hte time.  Since then has had good med compliance and kept legs elevated quite a bit.  Denies f/c, n/c, rash , diarrhea on the cleocin or other new complaints.  RLE sweling and erythema down by at least 50% if not more per pt.  Pt denies chest pain, increased sob or doe, wheezing, orthopnea, PND, increased LE swelling, palpitations, dizziness or syncope.   Pt denies polydipsia, polyuria, or low sugar symptoms such as weakness or confusion improved with po intake.  Pt states overall good compliance with meds, trying to follow lower cholesterol, diabetic diet.   Denies worsening depressive symptoms, suicidal ideation, or panic, though has some ongoing anxiety Past Medical History  Diagnosis Date  . Anxiety   . Asthma   . Depression   . Kidney stones     hx of   . Prostate cancer 2005    gleason 4+4=8  . Pulmonary fibrosis   . Lung cancer 2010    RLL  . Hx of radiation therapy may 2010 thru june 2010    5 tx, stereotactic radiotherapy  . Tremor, essential   . History of pneumonia   . COPD (chronic obstructive pulmonary disease)   . DM (diabetes mellitus)   . Urethral stricture   . Normocytic anemia   . Hypokalemia   . Diverticulosis   . Allergic rhinitis   . HLD (hyperlipidemia)   . Emphysema   . Urinary incontinence   . Hemorrhoids   . Hyperlipidemia 09/12/2011  . Adenomatous colon polyp   . Chronic left shoulder pain 09/28/2011   Past Surgical History  Procedure Date  . Appendectomy   . Hdr brachytherapy implant for prostate cancer 2005  . Cataract extraction, bilateral   . Internal urethrotomy 12/09/2008  . Colonoscopy October 2008    Diverticulosis, hemorrhoids  . Tonsillectomy 1943    reports that he quit smoking about 22 years ago. He has never used  smokeless tobacco. He reports that he does not drink alcohol or use illicit drugs. family history includes Cancer in his father; Colon cancer in his sister; Heart disease in his brother; Lung cancer in his brother; and Prostate cancer in his brother. Allergies  Allergen Reactions  . Sulfonamide Derivatives Nausea And Vomiting   Current Outpatient Prescriptions on File Prior to Visit  Medication Sig Dispense Refill  . AMBULATORY NON FORMULARY MEDICATION Oxygen At bedtime as directed         . aspirin EC 81 MG tablet Take 81 mg by mouth daily.        . cholecalciferol (VITAMIN D) 1000 UNITS tablet Take 1,000 Units by mouth daily.      . ciprofloxacin (CIPRO) 250 MG tablet Take 250 mg by mouth 2 (two) times daily. Course started 10/05/11 for 7 days      . clindamycin (CLEOCIN) 300 MG capsule Take 1 capsule (300 mg total) by mouth 3 (three) times daily.  30 capsule  0  . clonazePAM (KLONOPIN) 2 MG tablet Take 2 mg by mouth 2 (two) times daily.       . fish oil-omega-3 fatty acids 1000 MG capsule Take 2 g by mouth daily.        . montelukast (SINGULAIR)  10 MG tablet Take 10 mg by mouth at bedtime.        . Multiple Vitamin (MULTIVITAMIN) capsule Take 1 capsule by mouth daily.        Marland Kitchen tiotropium (SPIRIVA) 18 MCG inhalation capsule Place 18 mcg into inhaler and inhale daily.        Marland Kitchen triamcinolone (NASACORT) 55 MCG/ACT nasal inhaler Place 2 sprays into the nose daily as needed.        Review of Systems Review of Systems  Constitutional: Negative for diaphoresis and unexpected weight change.  HENT: Negative for drooling and tinnitus.   Eyes: Negative for photophobia and visual disturbance.  Respiratory: Negative for choking and stridor.   Gastrointestinal: Negative for vomiting and blood in stool.  Genitourinary: Negative for hematuria and decreased urine volume.     Objective:   Physical Exam BP 110/70  Pulse 101  Temp(Src) 97 F (36.1 C) (Oral)  Ht 5\' 6"  (1.676 m)  Wt 147 lb 6 oz  (66.849 kg)  BMI 23.79 kg/m2  SpO2 88% Physical Exam  VS noted Constitutional: Pt appears well-developed and well-nourished.  HENT: Head: Normocephalic.  Right Ear: External ear normal.  Left Ear: External ear normal.  Eyes: Conjunctivae and EOM are normal. Pupils are equal, round, and reactive to light.  Neck: Normal range of motion. Neck supple.  Cardiovascular: Normal rate and regular rhythm.   Pulmonary/Chest: Effort normal and breath sounds normal.  Abd:  Soft, NT, non-distended, + BS Neurological: Pt is alert. No cranial nerve deficit.  Skin: Skin is warm and mild erythema to distal leg band like anterior leg only approx 2x3 cm area, mild tender, no fluctuance or d/c, no red streaks or drainage  Psychiatric: Pt behavior is normal. Thought content normal. 1+ nervous    Assessment & Plan:

## 2011-10-11 NOTE — Assessment & Plan Note (Signed)
Improved, to finish antibx as prescribed, cont leg elevation ,  to f/u any worsening symptoms or concerns

## 2011-10-11 NOTE — Patient Instructions (Signed)
OK to finish the antibiotic as you have Continue all other medications as before

## 2011-10-11 NOTE — Assessment & Plan Note (Signed)
stable overall by hx and exam,  and pt to continue medical treatment as before - to cont the citalpram, tolerating well

## 2011-10-11 NOTE — Assessment & Plan Note (Signed)
stable overall by hx and exam, most recent data reviewed with pt, and pt to continue medical treatment as before Lab Results  Component Value Date   WBC 5.3 10/07/2011   HGB 11.4* 10/07/2011   HCT 36.5* 10/07/2011   PLT 124* 10/07/2011   GLUCOSE 75 10/07/2011   ALT 18 10/07/2011   AST 30 10/07/2011   NA 142 10/07/2011   K 4.0 10/07/2011   CL 105 10/07/2011   CREATININE 0.98 10/07/2011   BUN 21 10/07/2011   CO2 31 10/07/2011   TSH 1.82 09/05/2011   INR 1.25 04/20/2010    

## 2011-10-18 ENCOUNTER — Telehealth: Payer: Self-pay | Admitting: Internal Medicine

## 2011-10-18 NOTE — Telephone Encounter (Signed)
Patient is scheduled for 10/20/11 1:30, he has a conflict with other MD appt and can't come tomorrow

## 2011-10-18 NOTE — Telephone Encounter (Signed)
Cannot sort out over the phone - will need visit with me or an extender in 1-2 days

## 2011-10-18 NOTE — Telephone Encounter (Deleted)
Patient reports some rectal bleeding this am.  He reports he also has some left lower quad abdominal pain, he wonders if this is diverticulitis.  He believes h

## 2011-10-18 NOTE — Telephone Encounter (Signed)
Patient reports that he had some rectal bleeding this am with a BM.  He also reports he has had LLQ pain for the last week or so, He wonders if this is diverticulitis?   Roy [name] believes his bleeding is from hemorrhoids due to constipation and straining.  Please advise next step

## 2011-10-20 ENCOUNTER — Encounter: Payer: Self-pay | Admitting: Physician Assistant

## 2011-10-20 ENCOUNTER — Ambulatory Visit (INDEPENDENT_AMBULATORY_CARE_PROVIDER_SITE_OTHER): Payer: Medicare Other | Admitting: Physician Assistant

## 2011-10-20 DIAGNOSIS — K5792 Diverticulitis of intestine, part unspecified, without perforation or abscess without bleeding: Secondary | ICD-10-CM

## 2011-10-20 DIAGNOSIS — K5732 Diverticulitis of large intestine without perforation or abscess without bleeding: Secondary | ICD-10-CM

## 2011-10-20 DIAGNOSIS — K5730 Diverticulosis of large intestine without perforation or abscess without bleeding: Secondary | ICD-10-CM

## 2011-10-20 DIAGNOSIS — K5790 Diverticulosis of intestine, part unspecified, without perforation or abscess without bleeding: Secondary | ICD-10-CM | POA: Insufficient documentation

## 2011-10-20 MED ORDER — ALIGN 4 MG PO CAPS: 1.0000 | ORAL_CAPSULE | Freq: Every day | ORAL | Status: DC

## 2011-10-20 MED ORDER — CIPROFLOXACIN HCL 500 MG PO TABS: ORAL_TABLET | ORAL | Status: DC

## 2011-10-20 NOTE — Telephone Encounter (Signed)
XXXX 

## 2011-10-20 NOTE — Patient Instructions (Signed)
We sent a prescription electronically to [facility] for [facility], an antibiotic.  Call us back on Monday with a progress report, we want to know how you are doing. [phone] You can ask for Nelly Scriven.

## 2011-10-20 NOTE — Progress Notes (Signed)
Subjective:    Patient ID: Roy [name], male    DOB: 31-Jan-1921, 78 y.o.   MRN: [id number]  HPI Roy [name] is a very nice 78 year old white male known to Dr. Leone Payor. He has multiple medical problems. He was in the office about 10 days ago with complaints of altered bowel habits while he had been on a course of Cleocin for a lower extremity cellulitis. His MiraLax dosing was adjusted.  He comes in today with new complaint of lower abdominal pain which has been present over the past 5 .days. He finished the Cleocin for his cellulitis about a week ago. He has had prior history of diverticulitis though not for several years and says he thinks his current pain is coming from an episode of diverticulitis. He has not had any associated fever or chills no nausea or vomiting and his appetite has been fair. He is a difficult historian but states he has not had a bowel movement today and had 2 soft stools yesterday, no melena or hematochezia. He has not had a colonoscopy for many years but did have an MRI in 2011 of his abdomen which did show evidence of left colon diverticulosis., He also had cholelithiasis noted.    Review of Systems  Constitutional: Positive for appetite change.  HENT: Positive for congestion.   Eyes: Negative.   Respiratory: Negative.   Cardiovascular: Negative.   Gastrointestinal: Positive for abdominal pain.  Genitourinary: Negative.   Musculoskeletal: Negative.   Neurological: Positive for light-headedness.  Hematological: Negative.   Psychiatric/Behavioral: Negative.    Outpatient Prescriptions Prior to Visit  Medication Sig Dispense Refill  . AMBULATORY NON FORMULARY MEDICATION Oxygen At bedtime as directed         . aspirin EC 81 MG tablet Take 81 mg by mouth daily.        . cholecalciferol (VITAMIN D) 1000 UNITS tablet Take 1,000 Units by mouth daily.      . clonazePAM (KLONOPIN) 2 MG tablet Take 2 mg by mouth 2 (two) times daily.       . fish oil-omega-3 fatty acids  1000 MG capsule Take 2 g by mouth daily.        . montelukast (SINGULAIR) 10 MG tablet Take 10 mg by mouth at bedtime.        . Multiple Vitamin (MULTIVITAMIN) capsule Take 1 capsule by mouth daily.        Marland Kitchen tiotropium (SPIRIVA) 18 MCG inhalation capsule Place 18 mcg into inhaler and inhale daily.        Marland Kitchen triamcinolone (NASACORT) 55 MCG/ACT nasal inhaler Place 2 sprays into the nose daily as needed.       . ciprofloxacin (CIPRO) 250 MG tablet Take 250 mg by mouth 2 (two) times daily. Course started 10/05/11 for 7 days       Allergies  Allergen Reactions  . Sulfonamide Derivatives Nausea And Vomiting       Patient Active Problem List  Diagnoses  . NEOPLASM, MALIGNANT, LUNG, NONSMALL CELL  . COPD  . INTERSTITIAL LUNG DISEASE  . Kidney stones  . Prostate cancer  . Hx of radiation therapy  . Allergic rhinitis, cause unspecified  . Constipation  . Fatigue  . Hyperlipidemia  . Anxiety  . Tremor, essential  . History of pneumonia  . DM (diabetes mellitus)  . Urethral stricture  . Normocytic anemia  . Dysphagia  . Preventative health care  . Personal history of colonic polyps  . Chronic left shoulder pain  .  Hearing loss, right  . Depression  . Skin tear  . Cellulitis of leg, right  . Diverticulosis    Objective:   Physical Exam well-developed elderly white male in no acute distress, pleasant. He does have some dysarthria. Blood pressure 108/66 pulse 100 weight 145. HEENT; nonicteric normal cephalic EOMI PERRLA sclera anicteric,Neck; Supple no JVD, Cardiovascular; irregular rate and rhythm with S1-S2 no murmur or gallop, Pulmonary; clear bilaterally, Abdomen; soft bowel sounds are active there is no palpable mass or hepatosplenomegaly he does have tenderness in the suprapubic area and left lower quadrant . Rectal exam not done, Extremities; no clubbing cyanosis or edema skin warm dry, Psych mood and affect normal and appropriate       Assessment & Plan:  #64 78 year old white  male with 5 day history of lower abdominal discomfort and prior history of diverticulitis. I think he does have a mild diverticulitis now. #2 chronic problems with constipation and intermittent fecal incontinence #3 chronic mild dysphasia #4 history of prostate cancer status post radiation 5 history of non-small cell lung CA #6 COPD  Plan; Will start Cipro 500 mg by mouth twice daily x10 days Add Align  one by mouth daily x2 weeks Patient asked to call early next week with a progress report, if he fails to improve he will need imaging.

## 2011-10-23 ENCOUNTER — Telehealth: Payer: Self-pay | Admitting: Physician Assistant

## 2011-10-23 NOTE — Telephone Encounter (Signed)
Roy [name] called and said he is doing a little better.  He is also taking the Align capsules, 1 daily.  He did say the Cipro is making his stomach a little "quezy" but he said he can handle it.  I asked him to call me after he has finished the course of Cipro.

## 2011-10-23 NOTE — Progress Notes (Signed)
AGREE WITH INITIAL ASSESSMENT AND PLANS  

## 2011-10-29 ENCOUNTER — Encounter (HOSPITAL_COMMUNITY): Payer: Self-pay | Admitting: *Deleted

## 2011-10-29 ENCOUNTER — Emergency Department (HOSPITAL_COMMUNITY)
Admission: EM | Admit: 2011-10-29 | Discharge: 2011-10-29 | Disposition: A | Discharge status: Home / Self Care | Payer: Medicare Other | Attending: Emergency Medicine | Admitting: Emergency Medicine

## 2011-10-29 DIAGNOSIS — N35919 Unspecified urethral stricture, male, unspecified site: Secondary | ICD-10-CM | POA: Insufficient documentation

## 2011-10-29 DIAGNOSIS — R319 Hematuria, unspecified: Secondary | ICD-10-CM | POA: Insufficient documentation

## 2011-10-29 DIAGNOSIS — F341 Dysthymic disorder: Secondary | ICD-10-CM | POA: Insufficient documentation

## 2011-10-29 DIAGNOSIS — Z85118 Personal history of other malignant neoplasm of bronchus and lung: Secondary | ICD-10-CM | POA: Insufficient documentation

## 2011-10-29 DIAGNOSIS — R34 Anuria and oliguria: Secondary | ICD-10-CM | POA: Insufficient documentation

## 2011-10-29 DIAGNOSIS — J438 Other emphysema: Secondary | ICD-10-CM | POA: Insufficient documentation

## 2011-10-29 DIAGNOSIS — Z7982 Long term (current) use of aspirin: Secondary | ICD-10-CM | POA: Insufficient documentation

## 2011-10-29 DIAGNOSIS — Z8546 Personal history of malignant neoplasm of prostate: Secondary | ICD-10-CM | POA: Insufficient documentation

## 2011-10-29 DIAGNOSIS — E785 Hyperlipidemia, unspecified: Secondary | ICD-10-CM | POA: Insufficient documentation

## 2011-10-29 DIAGNOSIS — Z79899 Other long term (current) drug therapy: Secondary | ICD-10-CM | POA: Insufficient documentation

## 2011-10-29 DIAGNOSIS — R259 Unspecified abnormal involuntary movements: Secondary | ICD-10-CM | POA: Insufficient documentation

## 2011-10-29 DIAGNOSIS — E119 Type 2 diabetes mellitus without complications: Secondary | ICD-10-CM | POA: Insufficient documentation

## 2011-10-29 NOTE — ED Provider Notes (Signed)
History     CSN: [id number]  Arrival date & time 10/29/11  1933   First MD Initiated Contact with Patient 10/29/11 2010      Chief Complaint  Patient presents with  . Bleeding/Bruising    Pt's pulled out catheter     HPI This patient presents with concerns over hematuria, inability to pass a Foley catheter.  He notes a history of urethral stricture, secondary to prostate cancer.  Is not currently chronically catheterized, but requires intermittent self-catheterization.  He notes that over the past few days he's become gradually more oliguric, required more force to expel urine.  Just prior to arrival he tried to place a straight catheter.  Hematuria systems, and upon removing the catheter was bleeding from his urethral meatus.  He denies concurrent abdominal pain, fevers, chills, other focal complaints. Past Medical History  Diagnosis Date  . Anxiety   . Asthma   . Depression   . Kidney stones     hx of   . Prostate cancer 2005    gleason 4+4=8  . Pulmonary fibrosis   . Lung cancer 2010    RLL  . Hx of radiation therapy may 2010 thru june 2010    5 tx, stereotactic radiotherapy  . Tremor, essential   . History of pneumonia   . COPD (chronic obstructive pulmonary disease)   . DM (diabetes mellitus)   . Urethral stricture   . Normocytic anemia   . Hypokalemia   . Diverticulosis   . Allergic rhinitis   . HLD (hyperlipidemia)   . Emphysema   . Urinary incontinence   . Hemorrhoids   . Hyperlipidemia 09/12/2011  . Adenomatous colon polyp   . Chronic left shoulder pain 09/28/2011    Past Surgical History  Procedure Date  . Appendectomy   . Hdr brachytherapy implant for prostate cancer 2005  . Cataract extraction, bilateral   . Internal urethrotomy 12/09/2008  . Colonoscopy October 2008    Diverticulosis, hemorrhoids  . Tonsillectomy 1943    Family History  Problem Relation Age of Onset  . Prostate cancer Brother   . Colon cancer Sister   . Cancer Father    bladder  . Lung cancer Brother   . Heart disease Brother     History  Substance Use Topics  . Smoking status: Former Smoker -- 1.0 packs/day for 35 years    Quit date: 08/14/1989  . Smokeless tobacco: Never Used  . Alcohol Use: No     beer occassionally      Review of Systems  Constitutional: Negative for fever and chills.  Respiratory: Negative.   Cardiovascular: Negative.   Gastrointestinal: Negative for nausea and vomiting.  Genitourinary: Positive for hematuria. Negative for dysuria, discharge, penile swelling, scrotal swelling, penile pain and testicular pain.  Musculoskeletal: Negative.   Skin: Negative.   Neurological: Positive for tremors.  Hematological: Bruises/bleeds easily.    Allergies  Sulfonamide derivatives  Home Medications   Current Outpatient Rx  Name Route Sig Dispense Refill  . AMBULATORY NON FORMULARY MEDICATION  Oxygen At bedtime as directed       . ASPIRIN EC 81 MG PO TBEC Oral Take 81 mg by mouth daily.      . CHOLECALCIFEROL 2000 UNITS PO TABS Oral Take 1 tablet by mouth daily.    Marland Kitchen CIPROFLOXACIN HCL 500 MG PO TABS Oral Take 500 mg by mouth 2 (two) times daily.    Marland Kitchen CLINDAMYCIN HCL 300 MG PO CAPS Oral Take 300 mg  by mouth 3 (three) times daily.    Marland Kitchen CLONAZEPAM 2 MG PO TABS Oral Take 2 mg by mouth 2 (two) times daily.     Marland Kitchen DICYCLOMINE HCL PO Oral Take 1 tablet by mouth daily.     . OMEGA-3 FATTY ACIDS 1000 MG PO CAPS Oral Take 1 g by mouth daily.     Marland Kitchen MONTELUKAST SODIUM 10 MG PO TABS Oral Take 10 mg by mouth at bedtime.      Marland Kitchen ALIGN 4 MG PO CAPS Oral Take 1 capsule by mouth daily. 14 capsule 0    Lot # [id number] Exp date: 04/2012  . TIOTROPIUM BROMIDE MONOHYDRATE 18 MCG IN CAPS Inhalation Place 18 mcg into inhaler and inhale daily.      . TRIAMCINOLONE ACETONIDE 55 MCG/ACT NA INHA Nasal Place 2 sprays into the nose daily as needed. Allergies.      BP 110/62  Pulse 102  Resp 17  SpO2 95%  Physical Exam  Nursing note and vitals  reviewed. Constitutional: He is oriented to person, place, and time. He appears well-developed and well-nourished. No distress.  HENT:  Head: Normocephalic and atraumatic.  Eyes: Conjunctivae and EOM are normal.  Cardiovascular: Normal rate and regular rhythm.   Pulmonary/Chest: Effort normal. No stridor. No respiratory distress.  Abdominal: Soft. He exhibits no distension. There is no tenderness.  Musculoskeletal: He exhibits no edema.  Neurological: He is alert and oriented to person, place, and time. He displays tremor.    ED Course  Procedures (including critical care time)  Labs Reviewed - No data to display No results found.   No diagnosis found.    MDM  This elderly gentleman with history of prostate cancer, urethral stricture now presents with inability to pass a catheter through his urethra, and hematuria.  On exam he is in no distress, and he denies any fevers, vomiting, significant abdominal pain.  The patient is currently taking Cipro.  We passed a Foley catheter, and the patient was discharged in stable condition.  Given the denial of fevers, the unremarkable vital signs, the patient's close available he of followup, he was not started on additional medications.       Gerhard Munch, MD 10/29/11 2153

## 2011-10-29 NOTE — ED Notes (Signed)
ZOX:[id number]<AV> Expected date:10/29/11<BR> Expected time: 7:24 PM<BR> Means of arrival:Ambulance<BR> Comments:<BR> [facility] 80- Pulled out foley cath, bleeding

## 2011-10-29 NOTE — ED Notes (Signed)
Per EMS:  Pt was attempting to self cath and he met resistance, pulled catheter out and pt started bleeding from his penis.  Pt not complaining of pain, st's it was about 50cc, pt st's he had prostate cancer.  Pt alert and oriented.

## 2011-10-30 ENCOUNTER — Telehealth: Payer: Self-pay | Admitting: Physician Assistant

## 2011-10-30 NOTE — Telephone Encounter (Signed)
Patient calling with update for Mike Gip, PA  After finishing Cipro.  He is about the same. States he goes from constipation to diarrhea. He did go to the ED last night for urehtral stricture. He currently has a foley catheter and is waiting to hear back from his urologist.

## 2011-10-31 NOTE — Telephone Encounter (Signed)
Left a message for patient to call me. 

## 2011-10-31 NOTE — Telephone Encounter (Signed)
Spoke with patient and he states he is not having lower abdominal pain at this time.

## 2011-10-31 NOTE — Telephone Encounter (Signed)
If he is still having lower abdominal pain, i think a CT scan abd/pelvis should be done

## 2011-11-17 ENCOUNTER — Encounter: Payer: Self-pay | Admitting: Internal Medicine

## 2011-11-17 ENCOUNTER — Ambulatory Visit (INDEPENDENT_AMBULATORY_CARE_PROVIDER_SITE_OTHER): Payer: Medicare Other | Admitting: Internal Medicine

## 2011-11-17 VITALS — BP 98/58 | HR 84 | Temp 96.6°F | Resp 16 | Wt 147.8 lb

## 2011-11-17 DIAGNOSIS — L02419 Cutaneous abscess of limb, unspecified: Secondary | ICD-10-CM

## 2011-11-17 DIAGNOSIS — R609 Edema, unspecified: Secondary | ICD-10-CM

## 2011-11-17 DIAGNOSIS — L03116 Cellulitis of left lower limb: Secondary | ICD-10-CM

## 2011-11-17 MED ORDER — FUROSEMIDE 20 MG PO TABS: ORAL_TABLET | ORAL | Status: DC

## 2011-11-17 MED ORDER — CEPHALEXIN 250 MG PO CAPS: 250.0000 mg | ORAL_CAPSULE | Freq: Four times a day (QID) | ORAL | Status: AC

## 2011-11-17 NOTE — Progress Notes (Signed)
  Subjective:    Patient ID: Roy [name], male    DOB: 11/12/1920, 78 y.o.   MRN: [id number]  HPI  complains of recurrent fall - related to balance problems with tremor Most recent event last week - hit leg against chair during fall subsequent swelling B ankles and redness on L>R leg No bleeding or skin tear this event as in 08/2011  Past Medical History  Diagnosis Date  . Anxiety   . Asthma   . Depression   . Kidney stones     hx of   . Prostate cancer 2005    gleason 4+4=8  . Pulmonary fibrosis   . Lung cancer 2010    RLL  . Hx of radiation therapy may 2010 thru june 2010    5 tx, stereotactic radiotherapy  . Tremor, essential   . COPD (chronic obstructive pulmonary disease)   . DM (diabetes mellitus)   . Urethral stricture   . Normocytic anemia   . Diverticulosis   . Allergic rhinitis   . Hyperlipidemia   . Adenomatous colon polyp   . Chronic left shoulder pain     Review of Systems  Constitutional: Negative for fever and fatigue.  Respiratory: Negative for cough, shortness of breath and wheezing.   Neurological: Positive for tremors. Negative for seizures and weakness.       Objective:   Physical Exam BP 98/58  Pulse 84  Temp(Src) 96.6 F (35.9 C) (Oral)  Resp 16  Wt 147 lb 12 oz (67.019 kg)  SpO2 89% Constitutional: Mod-severe constant head/voice tremor. He appears well-developed and well-nourished. No distress.  Neck: Normal range of motion. Neck supple. No JVD present. No thyromegaly present.  Cardiovascular: Normal rate, regular rhythm and normal heart sounds.  No murmur heard. trace 3rd spacing BLE edema calves - ankles and feet are normal Pulmonary/Chest: Effort normal and breath sounds normal. No respiratory distress. no wheezes. Skin: mild erythema B calf, L>R consistent with early cellulitis - no skin tears, +hematoma over L lateral side - remaining skin is warm and dry.  No erythema or ulceration.  Psychiatric: he has a normal mood and affect.  behavior is normal. Judgment and thought content normal.   Lab Results  Component Value Date   WBC 5.3 10/07/2011   HGB 11.4* 10/07/2011   HCT 36.5* 10/07/2011   PLT 124* 10/07/2011   GLUCOSE 75 10/07/2011   ALT 18 10/07/2011   AST 30 10/07/2011   NA 142 10/07/2011   K 4.0 10/07/2011   CL 105 10/07/2011   CREATININE 0.98 10/07/2011   BUN 21 10/07/2011   CO2 31 10/07/2011   TSH 1.82 09/05/2011   INR 1.25 04/20/2010      Assessment & Plan:   B calf edema - suspect related to 3rd space, ?low albumin - no ankle or feet edema - also exac by mild repeat trauma with falls - tx with lasix 10-20 qd x 3 days, then as needed - also compression hose (thigh high) - written rx provided for same  Early cellulitis LLE - keflex qid x 1 week - erx done

## 2011-11-17 NOTE — Patient Instructions (Signed)
It was good to see you today. Fluid pill Lasix for 3 days, then as needed Keflex antibiotics 4x/day x 1 week -  Your prescription(s) have been submitted to your pharmacy. Please take as directed and contact our office if you believe you are having problem(s) with the medication(s). Prescription provided to you for compression hose. Apply each a.m. and remove at bedtime

## 2011-11-20 ENCOUNTER — Telehealth: Payer: Self-pay | Admitting: *Deleted

## 2011-11-20 ENCOUNTER — Telehealth: Payer: Self-pay | Admitting: Internal Medicine

## 2011-11-20 NOTE — Telephone Encounter (Signed)
Called pt no answer LMOM md response & recommendations... 11/20/11@2 :13pm/LMB

## 2011-11-20 NOTE — Telephone Encounter (Signed)
Pt is there waiting wanting to know how much compression md want 15-20, 20-30, or higher. Pls advise... 11/20/11@2 :48pm/LMB

## 2011-11-20 NOTE — Telephone Encounter (Signed)
Notified Olegario Messier gave md response. She is recommending 20-30 because his ankles are small & would fall off. MD ok 20-30.... 11/20/11@3 :06pm/LMB

## 2011-11-20 NOTE — Telephone Encounter (Signed)
No other med i likely to resolve or help swelling - needs to wear compression hose AM til bedtime and follow up with PCP - thanks

## 2011-11-20 NOTE — Telephone Encounter (Signed)
Per pt med for ankle is not working--swelling ankle--pt request another med--pt ph#--[phone]--[facility]-Per pt med was prescribed by Dr Rosana Fret call pt.

## 2011-11-20 NOTE — Telephone Encounter (Signed)
Start 15-20, can titrate as needed - thanks

## 2011-12-01 ENCOUNTER — Other Ambulatory Visit: Payer: Self-pay

## 2011-12-01 ENCOUNTER — Encounter: Payer: Self-pay | Admitting: Internal Medicine

## 2011-12-01 ENCOUNTER — Ambulatory Visit (INDEPENDENT_AMBULATORY_CARE_PROVIDER_SITE_OTHER): Payer: Medicare Other | Admitting: Internal Medicine

## 2011-12-01 VITALS — BP 90/52 | HR 107 | Temp 97.0°F | Resp 16 | Wt 145.0 lb

## 2011-12-01 DIAGNOSIS — L03115 Cellulitis of right lower limb: Secondary | ICD-10-CM

## 2011-12-01 DIAGNOSIS — E119 Type 2 diabetes mellitus without complications: Secondary | ICD-10-CM

## 2011-12-01 DIAGNOSIS — L03119 Cellulitis of unspecified part of limb: Secondary | ICD-10-CM

## 2011-12-01 DIAGNOSIS — R609 Edema, unspecified: Secondary | ICD-10-CM

## 2011-12-01 DIAGNOSIS — F329 Major depressive disorder, single episode, unspecified: Secondary | ICD-10-CM

## 2011-12-01 MED ORDER — MONTELUKAST SODIUM 10 MG PO TABS: 10.0000 mg | ORAL_TABLET | Freq: Every day | ORAL | Status: DC

## 2011-12-01 MED ORDER — TRIAMCINOLONE ACETONIDE(NASAL) 55 MCG/ACT NA INHA: 2.0000 | Freq: Every day | NASAL | Status: DC | PRN

## 2011-12-01 NOTE — Assessment & Plan Note (Signed)
Resolved except for right ankle effusion but no ankle pain;  to f/u any worsening symptoms or concerns. Continue all other medications as before; cont compression stockings

## 2011-12-01 NOTE — Patient Instructions (Addendum)
Your swelling and infection is much improved Please continue all medications the same for now Please continue your compression stockings as this can help greatly with swelling  OK to cancel your April 25 appointment  Please return in 3 months, or sooner if needed

## 2011-12-02 ENCOUNTER — Encounter: Payer: Self-pay | Admitting: Internal Medicine

## 2011-12-02 NOTE — Assessment & Plan Note (Signed)
stable overall by hx and exam, most recent data reviewed with pt, and pt to continue medical treatment as before Lab Results  Component Value Date   WBC 5.3 10/07/2011   HGB 11.4* 10/07/2011   HCT 36.5* 10/07/2011   PLT 124* 10/07/2011   GLUCOSE 75 10/07/2011   ALT 18 10/07/2011   AST 30 10/07/2011   NA 142 10/07/2011   K 4.0 10/07/2011   CL 105 10/07/2011   CREATININE 0.98 10/07/2011   BUN 21 10/07/2011   CO2 31 10/07/2011   TSH 1.82 09/05/2011   INR 1.25 04/20/2010    

## 2011-12-02 NOTE — Progress Notes (Signed)
Subjective:    Patient ID: Roy [name], male    DOB: November 07, 1920, 78 y.o.   MRN: [id number]  HPI  Here to f/u; overall doing ok,  Pt denies chest pain, increased sob or doe, wheezing, orthopnea, PND, increased LE swelling, palpitations, dizziness or syncope.  Pt denies new neurological symptoms such as new headache, or facial or extremity weakness or numbness   Pt denies polydipsia, polyuria, or low sugar symptoms such as weakness or confusion improved with po intake.  Pt states overall good compliance with meds, trying to follow lower cholesterol, diabetic diet, wt overall stable but little exercise however. Edema from last visit resolved.  Wearing compression stocking today  Denies worsening depressive symptoms, suicidal ideation, or panic. Past Medical History  Diagnosis Date  . Anxiety   . Asthma   . Depression   . Kidney stones     hx of   . Prostate cancer 2005    gleason 4+4=8  . Pulmonary fibrosis   . Lung cancer 2010    RLL  . Hx of radiation therapy may 2010 thru june 2010    5 tx, stereotactic radiotherapy  . Tremor, essential   . COPD (chronic obstructive pulmonary disease)   . DM (diabetes mellitus)   . Urethral stricture   . Normocytic anemia   . Diverticulosis   . Allergic rhinitis   . Hyperlipidemia   . Adenomatous colon polyp   . Chronic left shoulder pain    Past Surgical History  Procedure Date  . Appendectomy   . Hdr brachytherapy implant for prostate cancer 2005  . Cataract extraction, bilateral   . Internal urethrotomy 12/09/2008  . Colonoscopy October 2008    Diverticulosis, hemorrhoids  . Tonsillectomy 1943    reports that he quit smoking about 22 years ago. He has never used smokeless tobacco. He reports that he does not drink alcohol or use illicit drugs. family history includes Cancer in his father; Colon cancer in his sister; Heart disease in his brother; Lung cancer in his brother; and Prostate cancer in his brother. Allergies  Allergen Reactions   . Sulfonamide Derivatives Nausea And Vomiting   Current Outpatient Prescriptions on File Prior to Visit  Medication Sig Dispense Refill  . AMBULATORY NON FORMULARY MEDICATION Oxygen At bedtime as directed         . aspirin EC 81 MG tablet Take 81 mg by mouth daily.        . Cholecalciferol (VITAMIN D3 SUPER STRENGTH) 2000 UNITS TABS Take 1 tablet by mouth daily.      . clonazePAM (KLONOPIN) 2 MG tablet Take 2 mg by mouth 2 (two) times daily.       . fish oil-omega-3 fatty acids 1000 MG capsule Take 1 g by mouth daily.       . furosemide (LASIX) 20 MG tablet 1/2-1 tab every AM x 3 days for swelling/fluid, then as needed  10 tablet  0  . Probiotic Product (ALIGN) 4 MG CAPS Take 1 capsule by mouth daily.  14 capsule  0  . tiotropium (SPIRIVA) 18 MCG inhalation capsule Place 18 mcg into inhaler and inhale daily.        . montelukast (SINGULAIR) 10 MG tablet Take 1 tablet (10 mg total) by mouth at bedtime.  90 tablet  3  . triamcinolone (NASACORT) 55 MCG/ACT nasal inhaler Place 2 sprays into the nose daily as needed. Allergies.  3 Inhaler  3  . DISCONTD: DICYCLOMINE HCL PO Take 1 tablet  by mouth daily.        Review of Systems Review of Systems  Constitutional: Negative for diaphoresis and unexpected weight change.  HENT: Negative for drooling and tinnitus.   Eyes: Negative for photophobia and visual disturbance.  Respiratory: Negative for choking and stridor.   Gastrointestinal: Negative for vomiting and blood in stool.  Genitourinary: Negative for hematuria and decreased urine volume.     Objective:   Physical Exam BP 90/52  Pulse 107  Temp(Src) 97 F (36.1 C) (Oral)  Resp 16  Wt 145 lb (65.772 kg)  SpO2 88% Physical Exam  VS noted, frail but not ill appearing, walks with walker Constitutional: Pt appears well-developed and well-nourished.  HENT: Head: Normocephalic.  Right Ear: External ear normal.  Left Ear: External ear normal.  Eyes: Conjunctivae and EOM are normal. Pupils  are equal, round, and reactive to light.  Neck: Normal range of motion. Neck supple.  Cardiovascular: Normal rate and regular rhythm.   Pulmonary/Chest: Effort normal and breath sounds normal.  Right ankle small effusion, nontender Skin: Skin is warm. No erythema. Bilat LE's no edema Psychiatric: Pt behavior is normal. Thought content normal. Not depressed affect, minor nervous    Assessment & Plan:

## 2011-12-02 NOTE — Assessment & Plan Note (Signed)
stable overall by hx and exam, , and pt to continue medical treatment as before   

## 2011-12-02 NOTE — Assessment & Plan Note (Signed)
Resolved, pt reassured, no further tx needed at this time 

## 2011-12-04 ENCOUNTER — Telehealth: Payer: Self-pay

## 2011-12-04 DIAGNOSIS — F22 Delusional disorders: Secondary | ICD-10-CM

## 2011-12-04 NOTE — Telephone Encounter (Signed)
Called the patient and Dr. Leone Payor is ok to refer.

## 2011-12-04 NOTE — Telephone Encounter (Signed)
Done per emr 

## 2011-12-04 NOTE — Telephone Encounter (Signed)
Message copied by Pincus Sanes on Mon Dec 04, 2011  2:28 PM ------      Message from: Corwin Levins      Created: Caleen Essex Dec 01, 2011  5:23 PM      Regarding: swallow prob       I think Dr Leone Payor would be more appropriate, given than GI normally handles trouble with swallowing            Please make sure this is ok with pt      ----- Message -----         From: Vladimir Crofts Naya Ilagan         Sent: 12/01/2011   3:30 PM           To: Corwin Levins, MD            Patient would like referral to Dr. Delton Coombes for swallowing problems

## 2011-12-21 ENCOUNTER — Ambulatory Visit: Payer: Medicare Other | Admitting: Internal Medicine

## 2011-12-26 ENCOUNTER — Encounter: Payer: Self-pay | Admitting: Internal Medicine

## 2011-12-26 ENCOUNTER — Telehealth: Payer: Self-pay

## 2011-12-26 ENCOUNTER — Ambulatory Visit: Payer: Medicare Other | Admitting: Internal Medicine

## 2011-12-26 ENCOUNTER — Ambulatory Visit (INDEPENDENT_AMBULATORY_CARE_PROVIDER_SITE_OTHER): Payer: Medicare Other | Admitting: Internal Medicine

## 2011-12-26 VITALS — BP 112/66 | HR 60 | Ht 67.0 in | Wt 144.0 lb

## 2011-12-26 DIAGNOSIS — R1319 Other dysphagia: Secondary | ICD-10-CM

## 2011-12-26 DIAGNOSIS — C3490 Malignant neoplasm of unspecified part of unspecified bronchus or lung: Secondary | ICD-10-CM

## 2011-12-26 DIAGNOSIS — Z923 Personal history of irradiation: Secondary | ICD-10-CM

## 2011-12-26 MED ORDER — BECLOMETHASONE DIPROPIONATE 80 MCG/ACT IN AERS: 1.0000 | INHALATION_SPRAY | Freq: Two times a day (BID) | RESPIRATORY_TRACT | Status: DC

## 2011-12-26 NOTE — Telephone Encounter (Signed)
[facility] requesting PA on Singulair (Montelukast) or offer alternative. Alternatives would be an oral inhaled corticosteroid which would require no PA. Please advise

## 2011-12-26 NOTE — Telephone Encounter (Signed)
Ok to try qvar as prescribed

## 2011-12-26 NOTE — Progress Notes (Signed)
  Subjective:    Patient ID: Roy [name], male    DOB: [birth year], 78 y.o.   MRN: [id number]  HPI Here because of dysphagia. Experiencing intermittent solid food dysphagia with suprasternal stick for some time now. May be getting worse. He has questioned if related to sinus drainage. Spits up yellow secretions in AM. Generally relieves dysphagia by drinking. Does have known aspiration which is better with chin tuck, he has not been a fan of thickener as recommended by Speech Path in 2012 as believed it caused constipation. Constipation and fecal incontinence much better now, using Align. Not on MiraLax now.  Medications, allergies, past medical history, past surgical history, family history and social history are reviewed and updated in the EMR.  Review of Systems As above    Objective:   Physical Exam Elderly, NAD Diffuse intention tremor, especially in head Lungs are clear with decreased breath sounds at bases Heart with distant S1 and S2 Neck without masses, thyromegaly and no cervical or [place] lymphadenopathy Alert and oriented x 3      Assessment & Plan:   1. Dysphagia   2. History of radiation therapy   3. Non-small cell lung cancer    Complicated history with elderly patient with lung disease and prior XRT for lung cancer. Start with barium swallow - probably needs thickened barium. Could need endoscopy with dilation but is higher-risk for problems. I explained this to him.

## 2011-12-26 NOTE — Patient Instructions (Addendum)
You have been scheduled for a Barium Esophogram at [facility] (1st floor of the hospital) on 01-01-12 at 10:30.  Please arrive 15 minutes prior to your appointment for registration.  If you need to reschedule for any reason, please contact radiology at [phone] to do so.  We will call you with the results

## 2011-12-27 NOTE — Telephone Encounter (Signed)
Patient informed of [facility].

## 2012-01-01 ENCOUNTER — Ambulatory Visit (HOSPITAL_COMMUNITY)
Admission: RE | Admit: 2012-01-01 | Discharge: 2012-01-01 | Disposition: A | Discharge status: Home / Self Care | Payer: Medicare Other | Source: Ambulatory Visit | Attending: Internal Medicine | Admitting: Internal Medicine

## 2012-01-01 DIAGNOSIS — Z923 Personal history of irradiation: Secondary | ICD-10-CM | POA: Insufficient documentation

## 2012-01-01 DIAGNOSIS — R1319 Other dysphagia: Secondary | ICD-10-CM

## 2012-01-01 DIAGNOSIS — K8020 Calculus of gallbladder without cholecystitis without obstruction: Secondary | ICD-10-CM | POA: Insufficient documentation

## 2012-01-01 DIAGNOSIS — K225 Diverticulum of esophagus, acquired: Secondary | ICD-10-CM | POA: Insufficient documentation

## 2012-01-01 DIAGNOSIS — C3490 Malignant neoplasm of unspecified part of unspecified bronchus or lung: Secondary | ICD-10-CM

## 2012-01-01 DIAGNOSIS — IMO0002 Reserved for concepts with insufficient information to code with codable children: Secondary | ICD-10-CM | POA: Insufficient documentation

## 2012-01-01 DIAGNOSIS — Z85118 Personal history of other malignant neoplasm of bronchus and lung: Secondary | ICD-10-CM | POA: Insufficient documentation

## 2012-01-01 DIAGNOSIS — R1310 Dysphagia, unspecified: Secondary | ICD-10-CM | POA: Insufficient documentation

## 2012-01-01 DIAGNOSIS — K224 Dyskinesia of esophagus: Secondary | ICD-10-CM | POA: Insufficient documentation

## 2012-01-01 DIAGNOSIS — T17408A Unspecified foreign body in trachea causing other injury, initial encounter: Secondary | ICD-10-CM | POA: Insufficient documentation

## 2012-01-01 NOTE — Progress Notes (Signed)
Quick Note:  Let him know that there are three problems identified:  1) aspiration 2) Zenker's - small pouch at top of throat that can trap food/secretions 3) dysmotility - esophagus does not squeeze properly  Recommendations are: 1) Avoid solid foods that are sticking or cut them very small 2) Repeat speech pathology evaluation re: aspiration problems - reassess and make new recommendations, if applicable  ______

## 2012-01-02 ENCOUNTER — Other Ambulatory Visit: Payer: Self-pay

## 2012-01-02 DIAGNOSIS — T17908A Unspecified foreign body in respiratory tract, part unspecified causing other injury, initial encounter: Secondary | ICD-10-CM

## 2012-01-03 ENCOUNTER — Telehealth: Payer: Self-pay | Admitting: Internal Medicine

## 2012-01-03 NOTE — Telephone Encounter (Signed)
The patient reports that it is not rectal bleeding, but he has a nose bleed.  He reports that when he blows his nose that he has some bleeding.  He has been using Nasacort and this may have irritated his nose and that he does have occasional nose bleeds.  I have asked him to avoid blowing his nose for the next several days if at all possible.  He will call back for worsening symptoms.  He wanted Dr Leone Payor to be aware.  He is also advised of his speech therapy appt on 01/11/12 1:45 at [address]

## 2012-01-10 ENCOUNTER — Telehealth: Payer: Self-pay

## 2012-01-10 NOTE — Telephone Encounter (Signed)
Roy [name] is calling from Speech Therapy.  States that the best way to eval Roy [name] is by a FEES study or MBS.,  He can do a clinic consult but won't give enough info.  Dr Leone Payor please advise next step FEES or MBS.

## 2012-01-11 ENCOUNTER — Ambulatory Visit: Payer: Medicare Other

## 2012-01-11 ENCOUNTER — Other Ambulatory Visit (HOSPITAL_COMMUNITY): Payer: Self-pay | Admitting: Internal Medicine

## 2012-01-11 NOTE — Telephone Encounter (Signed)
MBS scheduled for 02/16/12 @[facility]  for 11:30, he needs to arrive at 11:15 for registration on the first floor in radiology.  I have left a message for him to call back

## 2012-01-11 NOTE — Telephone Encounter (Signed)
Patient was advised of the appt date an time by speech path dept.  He could not go to the appt for 02/16/12 and they helped him reschedule for [facility] for 02/19/12

## 2012-01-11 NOTE — Telephone Encounter (Signed)
Either of those studies are fine - I was not specific enough previously - I do want repeat MBS or if he thinks the other study is better can do that

## 2012-01-16 ENCOUNTER — Ambulatory Visit (HOSPITAL_COMMUNITY): Payer: Medicare Other

## 2012-01-18 ENCOUNTER — Other Ambulatory Visit (HOSPITAL_COMMUNITY): Payer: Medicare Other

## 2012-01-19 ENCOUNTER — Ambulatory Visit (HOSPITAL_COMMUNITY)
Admission: RE | Admit: 2012-01-19 | Discharge: 2012-01-19 | Disposition: A | Discharge status: Home / Self Care | Payer: Medicare Other | Source: Ambulatory Visit | Attending: Internal Medicine | Admitting: Internal Medicine

## 2012-01-19 ENCOUNTER — Encounter (HOSPITAL_COMMUNITY): Payer: Medicare Other

## 2012-01-19 ENCOUNTER — Ambulatory Visit (HOSPITAL_COMMUNITY): Payer: Medicare Other

## 2012-01-19 DIAGNOSIS — R1310 Dysphagia, unspecified: Secondary | ICD-10-CM | POA: Insufficient documentation

## 2012-01-19 NOTE — Procedures (Signed)
Objective Swallowing Evaluation: Modified Barium Swallowing Study  Patient Details  Name: Roy [name] MRN: [id number] Date of Birth: 1920/11/18  Today's Date: 01/19/2012 Time: 7829-5621 SLP Time Calculation (min): 71 min  Past Medical History:  Past Medical History  Diagnosis Date  . Anxiety   . Asthma   . Depression   . Kidney stones     hx of   . Prostate cancer 2005    gleason 4+4=8  . Pulmonary fibrosis   . Lung cancer 2010    RLL  . Hx of radiation therapy may 2010 thru june 2010    5 tx, stereotactic radiotherapy  . Tremor, essential   . COPD (chronic obstructive pulmonary disease)   . DM (diabetes mellitus)   . Urethral stricture   . Normocytic anemia   . Diverticulosis   . Allergic rhinitis   . Hyperlipidemia   . Adenomatous colon polyp   . Chronic left shoulder pain   . Chronic constipation   . Hemorrhoids   . Cellulitis of leg, right   . Edema    Past Surgical History:  Past Surgical History  Procedure Date  . Appendectomy   . Hdr brachytherapy implant for prostate cancer 2005  . Cataract extraction, bilateral   . Internal urethrotomy 12/09/2008  . Colonoscopy October 2008    Diverticulosis, hemorrhoids  . Tonsillectomy 1943   HPI:  78 yo male with longstanding history of dysphagia referred for repeat MBS.  Pt PMH + for prostate cancer, nonsmall small lung cancer s/p XRT, anemia, familial tremor, and hypokalemia.  Pt quit smoking in 1990.  Pt denies pulmonary infections and states weight has been stable at 139 pounds for the last 1 1/2 years.  Occasional cough with water continues but pt denies significant problems swallowing.      Assessment / Plan / Recommendation Clinical Impression  Dysphagia Diagnosis: Moderate oral phase dysphagia;Moderate pharyngeal phase dysphagia;Mild cervical esophageal phase dysphagia Clinical impression: Pt's chronic oropharyngeal dysphagia appear overall consistent with findings from May 2012 MBS.  A single episode of  GROSS AUDIBLE aspiration observed due to combination of oral discoordination (tremor) and delay in pharyngeal swallow reflex.  All further boluses of thin (x6) were not grossly aspirated and trace aspirates cleared with cued cough.  Pharyngeal residuals appeared worse with liqiuids than solids, likely d/t decr intra oral pressure and decr UES relaxation.  Dry swallows aided clearance.  Recommendations continue to be soft/ground meats/thin WATER with meals, following solids with liquids, intermittent dry swallow with liquids, intermittent cough to clear trace aspriates.  Rec strict aspiration precautions and compensatory compliance due to silent nature of dysphagia.  Please note, pt states he has not followed compensatory strategies fully and he has not had weight loss or pulmonary infections, therefore is tolerating his intermittent aspiration.  Do not recommend pt have SLP therapy outpt, as do not anticipate would change his outcomes and his swallow is functional for him.  He has also been provided with written exercises to maximize function (Shaker Head Lift to incr laryngeal elevation), written compensatory strategies for oropharyngeal and esophageal issues and xerostomia.  Please note:  Pt reports coughing approx 5 minutes after consuming water approx 30% of the time.  RSI administered to pt with resulting score of 17/45- provided pt with tips for already diagnosed esophageal issues.  Thanks for this referral.      Treatment Recommendation       Diet Recommendation Dysphagia 3 (Mechanical Soft);Thin liquid   Other  Recommendations  Follow Up Recommendations  None    Frequency and Duration        Pertinent Vitals/Pain     SLP Swallow Goals     General Date of Onset: 01/19/12 HPI: 78 yo male with longstanding history of dysphagia referred for repeat MBS.  Pt PMH + for prostate cancer, nonsmall small lung cancer s/p XRT, anemia, familial tremor, and hypokalemia.  Pt quit smoking in 1990.  Pt  denies pulmonary infections and states weight has been stable at 139 pounds for the last 1 1/2 years.  Occasional cough with water continues but pt denies significant problems swallowing.  Type of Study: Modified Barium Swallowing Study Previous Swallow Assessment: MBS May 2012:  mod op dysphagia, silent asp of thin with sequential straw, small cup sips tolerated, rec soft/ground/thin waer with meals.  March 2012:  mod op dysphagia, tremor, decr laryngeal elevation silent asp of thin via large cup, mech soft/thin  March 2011 mod op dysphagia, silent gross aspiration, rec nectar/soft  Esophagram 01/01/12 aspiration, esoph dysmotility, cholelithiasis, mild distal esophageal fold thickening may reflect esophagitis, small zenker's diverticulum Diet Prior to this Study: Regular;Thin liquids Temperature Spikes Noted: No Respiratory Status: Room air Behavior/Cognition: Alert;Cooperative;Pleasant mood Oral Cavity - Dentition:  (partial-adequate dentition) Oral Motor / Sensory Function: Impaired motor Oral impairment:  (tremor) Vision: Functional for self-feeding Patient Positioning: Upright in chair Baseline Vocal Quality: Other (comment) (tremorous) Volitional Cough: Strong Volitional Swallow: Able to elicit Anatomy: Within functional limits    Reason for Referral Re-eval swallow  Oral Phase  Oral phase:  Impaired  Pharyngeal Phase Pharyngeal Phase: Impaired   Cervical Esophageal Phase Cervical Esophageal Phase: Impaired    Donavan Burnet, MS [facility] SLP [phone]

## 2012-01-23 ENCOUNTER — Telehealth: Payer: Self-pay | Admitting: Internal Medicine

## 2012-01-23 NOTE — Telephone Encounter (Signed)
Dr Leone Payor I don't know if you have seen the results of the MBS from Friday, it is under notes.  He was told that he doesn't need the rehab that was scheduled and he has cancelled this appt.

## 2012-01-24 ENCOUNTER — Ambulatory Visit: Payer: Medicare Other

## 2012-01-24 NOTE — Telephone Encounter (Signed)
See MBS note also  No further GI evaluation Needs to follow speech path recommendations See me prn

## 2012-01-24 NOTE — Progress Notes (Signed)
Quick Note:  Needs to follow speech path recommendations  See me as needed    Assessment / Plan / Recommendation Clinical Impression   Dysphagia Diagnosis: Moderate oral phase dysphagia;Moderate pharyngeal phase dysphagia;Mild cervical esophageal phase dysphagia  Clinical impression: Pt's chronic oropharyngeal dysphagia appear overall consistent with findings from May 2012 MBS. A single episode of GROSS AUDIBLE aspiration observed due to combination of oral discoordination (tremor) and delay in pharyngeal swallow reflex. All further boluses of thin (x6) were not grossly aspirated and trace aspirates cleared with cued cough. Pharyngeal residuals appeared worse with liqiuids than solids, likely d/t decr intra oral pressure and decr UES relaxation. Dry swallows aided clearance. Recommendations continue to be soft/ground meats/thin WATER with meals, following solids with liquids, intermittent dry swallow with liquids, intermittent cough to clear trace aspriates. Rec strict aspiration precautions and compensatory compliance due to silent nature of dysphagia. Please note, pt states he has not followed compensatory strategies fully and he has not had weight loss or pulmonary infections, therefore is tolerating his intermittent aspiration.  Do not recommend pt have SLP therapy outpt, as do not anticipate would change his outcomes and his swallow is functional for him. He has also been provided with written exercises to maximize function (Shaker Head Lift to incr laryngeal elevation), written compensatory strategies for oropharyngeal and esophageal issues and xerostomia. Please note: Pt reports coughing approx 5 minutes after consuming water approx 30% of the time. RSI administered to pt with resulting score of 17/45- provided pt with tips for already diagnosed esophageal issues. Thanks for this referral.   Treatment Recommendation    Diet Recommendation Dysphagia 3 (Mechanical Soft);Thin liquid  Other  Recommendations  Follow Up Recommendations   None   Frequency and Duration    Pertinent Vitals/Pain       SLP Swallow Goals     General Date of Onset: 01/19/12  HPI: 78 yo male with longstanding history of dysphagia referred for repeat MBS. Pt PMH + for prostate cancer, nonsmall small lung cancer s/p XRT, anemia, familial tremor, and hypokalemia. Pt quit smoking in 1990. Pt denies pulmonary infections and states weight has been stable at 139 pounds for the last 1 1/2 years. Occasional cough with water continues but pt denies significant problems swallowing.  Type of Study: Modified Barium Swallowing Study Previous Swallow Assessment: MBS May 2012: mod op dysphagia, silent asp of thin with sequential straw, small cup sips tolerated, rec soft/ground/thin waer with meals. March 2012: mod op dysphagia, tremor, decr laryngeal elevation silent asp of thin via large cup, mech soft/thin March 2011 mod op dysphagia, silent gross aspiration, rec nectar/soft Esophagram 01/01/12 aspiration, esoph dysmotility, cholelithiasis, mild distal esophageal fold thickening may reflect esophagitis, small zenker's diverticulum Diet Prior to this Study: Regular;Thin liquids Temperature Spikes Noted: No Respiratory Status: Room air Behavior/Cognition: Alert;Cooperative;Pleasant mood Oral Cavity - Dentition: (partial-adequate dentition) Oral Motor / Sensory Function: Impaired motor Oral impairment: (tremor) Vision: Functional for self-feeding Patient Positioning: Upright in chair Baseline Vocal Quality: Other (comment) (tremorous) Volitional Cough: Strong Volitional Swallow: Able to elicit Anatomy: Within functional limits   Reason for Referral Re-eval swallow  Oral Phase Oral phase: Impaired  Pharyngeal Phase Pharyngeal Phase: Impaired  Cervical Esophageal Phase Cervical Esophageal Phase: Impaired   ______

## 2012-02-06 ENCOUNTER — Ambulatory Visit (HOSPITAL_COMMUNITY)
Admission: RE | Admit: 2012-02-06 | Discharge: 2012-02-06 | Disposition: A | Discharge status: Home / Self Care | Payer: Medicare Other | Source: Ambulatory Visit | Attending: Radiation Oncology | Admitting: Radiation Oncology

## 2012-02-06 DIAGNOSIS — Z923 Personal history of irradiation: Secondary | ICD-10-CM | POA: Insufficient documentation

## 2012-02-06 DIAGNOSIS — N289 Disorder of kidney and ureter, unspecified: Secondary | ICD-10-CM | POA: Insufficient documentation

## 2012-02-06 DIAGNOSIS — J984 Other disorders of lung: Secondary | ICD-10-CM | POA: Insufficient documentation

## 2012-02-06 DIAGNOSIS — C3490 Malignant neoplasm of unspecified part of unspecified bronchus or lung: Secondary | ICD-10-CM

## 2012-02-06 DIAGNOSIS — J8410 Pulmonary fibrosis, unspecified: Secondary | ICD-10-CM | POA: Insufficient documentation

## 2012-02-15 ENCOUNTER — Ambulatory Visit: Payer: Medicare Other | Admitting: Radiation Oncology

## 2012-02-26 ENCOUNTER — Telehealth: Payer: Self-pay | Admitting: Internal Medicine

## 2012-02-26 NOTE — Telephone Encounter (Signed)
Patient reports diarrhea.  He states that everything he eats "goes right through me".  He reports weight loss.  He will come in and see Willette Cluster RNP tomorrow at 1:30 for evaluation

## 2012-02-27 ENCOUNTER — Encounter: Payer: Self-pay | Admitting: Nurse Practitioner

## 2012-02-27 ENCOUNTER — Ambulatory Visit (INDEPENDENT_AMBULATORY_CARE_PROVIDER_SITE_OTHER): Payer: Medicare Other | Admitting: Nurse Practitioner

## 2012-02-27 VITALS — BP 100/52 | HR 72 | Ht 67.0 in | Wt 140.8 lb

## 2012-02-27 DIAGNOSIS — K5900 Constipation, unspecified: Secondary | ICD-10-CM

## 2012-02-27 MED ORDER — LINACLOTIDE 145 MCG PO CAPS: 1.0000 | ORAL_CAPSULE | Freq: Every day | ORAL | Status: DC

## 2012-02-27 NOTE — Patient Instructions (Addendum)
We have given you samples of Linzess, take 1 capsule daily. Call us at [phone] and ask for Davy [place]. We would like to know how you are doing, is the Linzess helping with constipation. Any questions about this, feel free to call us.

## 2012-02-27 NOTE — Progress Notes (Signed)
Roy [name] [id number] [birth year]   HISTORY OR PRESENT ILLNESS :  Patient is an 78 year old male followed by Dr. Leone Payor for esophageal dysmotility and constipation.  He is difficult to understand secondary to severe tremors. Patient was last seen 12/26/11 for diarrhea / fecal incontinence. He was felt to being having overflow diarrhea from constipation. He was started on Miralax and [address]. He could not tolerate the Miralax, even 1/2 capful gave him liquid stool. He has tried stool softeners, they don't help. Patient is back with complaint of diarrhea. He is concerned about some recent weight loss. His appetite was poor for a while but has improved as of late.   Patient may go three days without a BM. When he does have a BM the stool is small in caliber and this concerns him.Marland Kitchen He is frequently incontinent of stool. These are same symptoms as when he was here before  Current Medications, Allergies, Past Medical History, Past Surgical History, Family History and Social History were reviewed in [employer] record.   PHYSICAL EXAMINATION : General:  Well developed male in no acute distress Eyes:  sclerae anicteric,conjunctive pink. Lungs: Breath sounds decreased at bilateral bases.  Heart: Regular rate and rhythm Abdomen: Soft, nondistended, nontender. No masses or hepatomegaly noted. Normal bowel sounds Rectal: Small a mount of soft, light brown stool in vault Musculoskeletal: Symmetrical with no gross deformities  Skin: No lesions on visible extremities Extremities: No edema or deformities noted Psychological:  Alert and cooperative. Normal mood and affect  ASSESSMENT AND PLAN :   Altered bowel habits with constipation /loose stool / incontinence. Suspect constipation with overflow diarrhea. Couldn't tolerate Miralax secondary to diarrhea. Stool softeners have not helped in the past. He was unable to tolerate fiber because of esophageal dysmotility. Treatment options  are limited. Will try LInzess, samples given. Patient will call with a condition update in 7-10 days.   2. Dysphagia, tolerating soids. He avoids thin liquids.

## 2012-03-04 NOTE — Progress Notes (Signed)
I AGREE WITH ASSESSMENT AND PLANS 

## 2012-03-05 ENCOUNTER — Encounter: Payer: Self-pay | Admitting: Internal Medicine

## 2012-03-05 ENCOUNTER — Ambulatory Visit (INDEPENDENT_AMBULATORY_CARE_PROVIDER_SITE_OTHER): Payer: Medicare Other | Admitting: Internal Medicine

## 2012-03-05 VITALS — BP 112/50 | HR 102 | Temp 98.2°F | Wt 141.8 lb

## 2012-03-05 DIAGNOSIS — F329 Major depressive disorder, single episode, unspecified: Secondary | ICD-10-CM

## 2012-03-05 DIAGNOSIS — G250 Essential tremor: Secondary | ICD-10-CM

## 2012-03-05 DIAGNOSIS — G252 Other specified forms of tremor: Secondary | ICD-10-CM

## 2012-03-05 DIAGNOSIS — J449 Chronic obstructive pulmonary disease, unspecified: Secondary | ICD-10-CM

## 2012-03-05 DIAGNOSIS — E119 Type 2 diabetes mellitus without complications: Secondary | ICD-10-CM

## 2012-03-05 NOTE — Patient Instructions (Addendum)
Continue all other medications as before You are otherwise up to date with prevention No lab work needed today You will be contacted regarding the referral for: neurology Please return in 6 mo with Lab testing done 3-5 days before

## 2012-03-05 NOTE — Assessment & Plan Note (Signed)
Doubt good candidate for beta blocker, ok for neuro referral

## 2012-03-05 NOTE — Progress Notes (Signed)
Subjective:    Patient ID: Roy [name], male    DOB: 10-Apr-1921, 78 y.o.   MRN: [id number]  HPI  Here to f/u; overall ok but c/o worsening 1-2 mo tremor which affects head, limbs, voice and ambulation, cannot write well and hard to speak and be understood. Pt denies new neurological symptoms such as new headache, or facial or extremity weakness or numbness  Did have recent swallowing test.  Has been seeing Dr Brennan Bailey which is of the most import to him overall with "everything I eat goes right through me." Just recently seen July 2 with samples Linzess but has not yet tried, a bit afraid to try after reading the information with it that cautioned against abd pain and diarrhea.  Eating well, wt stable.  Pt denies chest pain, increased sob or doe, wheezing, orthopnea, PND, increased LE swelling, palpitations, dizziness or syncope.  Has had slight increased glc in recent past but  Pt denies polydipsia, polyuria.  Pt denies fever, wt loss, night sweats, loss of appetite, or other constitutional symptoms  No other acute complaints Past Medical History  Diagnosis Date  . Anxiety   . Asthma   . Depression   . Kidney stones     hx of   . Prostate cancer 2005    gleason 4+4=8  . Pulmonary fibrosis   . Lung cancer 2010    RLL  . Hx of radiation therapy may 2010 thru june 2010    5 tx, stereotactic radiotherapy  . Tremor, essential   . COPD (chronic obstructive pulmonary disease)   . DM (diabetes mellitus)   . Urethral stricture   . Normocytic anemia   . Diverticulosis   . Allergic rhinitis   . Hyperlipidemia   . Adenomatous colon polyp   . Chronic left shoulder pain   . Chronic constipation   . Hemorrhoids   . Cellulitis of leg, right   . Edema    Past Surgical History  Procedure Date  . Appendectomy   . Hdr brachytherapy implant for prostate cancer 2005  . Cataract extraction, bilateral   . Internal urethrotomy 12/09/2008  . Colonoscopy October 2008    Diverticulosis, hemorrhoids    . Tonsillectomy 1943    reports that he quit smoking about 22 years ago. He has never used smokeless tobacco. He reports that he does not drink alcohol or use illicit drugs. family history includes Cancer in his father; Colon cancer in his sister; Heart disease in his brother; Lung cancer in his brother; and Prostate cancer in his brother. Allergies  Allergen Reactions  . Sulfonamide Derivatives Nausea And Vomiting   Current Outpatient Prescriptions on File Prior to Visit  Medication Sig Dispense Refill  . AMBULATORY NON FORMULARY MEDICATION Oxygen At bedtime as directed         . aspirin EC 81 MG tablet Take 81 mg by mouth daily.        . beclomethasone (QVAR) 80 MCG/ACT inhaler Inhale 1 puff into the lungs 2 (two) times daily.  3 Inhaler  3  . Cholecalciferol (VITAMIN D3 SUPER STRENGTH) 2000 UNITS TABS Take 1 tablet by mouth daily.      . clonazePAM (KLONOPIN) 2 MG tablet Take 2 mg by mouth 2 (two) times daily.       . fish oil-omega-3 fatty acids 1000 MG capsule Take 1 g by mouth daily.       . Linaclotide (LINZESS) 145 MCG CAPS Take 1 capsule by mouth daily.  20  capsule  0  . montelukast (SINGULAIR) 10 MG tablet Take 1 tablet (10 mg total) by mouth at bedtime.  90 tablet  3  . Probiotic Product (ALIGN) 4 MG CAPS Take 1 capsule by mouth daily.  14 capsule  0  . tiotropium (SPIRIVA) 18 MCG inhalation capsule Place 18 mcg into inhaler and inhale daily.        Marland Kitchen triamcinolone (NASACORT) 55 MCG/ACT nasal inhaler Place 2 sprays into the nose daily as needed. Allergies.  3 Inhaler  3  . DISCONTD: DICYCLOMINE HCL PO Take 1 tablet by mouth daily.        Review of Systems Review of Systems  Constitutional: Negative for diaphoresis and unexpected weight change.  HENT: Negative for tinnitus.   Eyes: Negative for photophobia and visual disturbance.  Respiratory: Negative for choking and stridor.   Gastrointestinal: Negative for vomiting and blood in stool.  Genitourinary: Negative for  hematuria and decreased urine volume.  Musculoskeletal: Negative for acute joint swelling or pain.  Skin: Negative for color change and wound.  Neurological: Negative for  numbness.  Psychiatric/Behavioral: Negative for decreased concentration. The patient is not hyperactive.      Objective:   Physical Exam BP 112/50  Pulse 102  Temp 98.2 F (36.8 C) (Oral)  Wt 141 lb 12 oz (64.297 kg)  SpO2 93% Physical Exam  VS noted Constitutional: Pt appears well-developed and well-nourished.  HENT: Head: Normocephalic.  Right Ear: External ear normal.  Left Ear: External ear normal.  Eyes: Conjunctivae and EOM are normal. Pupils are equal, round, and reactive to light.  Neck: Normal range of motion. Neck supple.  Cardiovascular: Normal rate and regular rhythm.   Pulmonary/Chest: Effort normal and breath sounds normal.  Abd:  Soft, NT, non-distended, + BS Neurological: Pt is alert. Severe tremor head, UE's, voice noted Skin: Skin is warm. No erythema.  Psychiatric: Pt behavior is normal. Thought content normal. not depressed affect    Assessment & Plan:

## 2012-03-05 NOTE — Assessment & Plan Note (Signed)
stable overall by hx and exam, most recent data reviewed with pt, and pt to continue medical treatment as before Lab Results  Component Value Date   WBC 5.3 10/07/2011   HGB 11.4* 10/07/2011   HCT 36.5* 10/07/2011   PLT 124* 10/07/2011   GLUCOSE 75 10/07/2011   ALT 18 10/07/2011   AST 30 10/07/2011   NA 142 10/07/2011   K 4.0 10/07/2011   CL 105 10/07/2011   CREATININE 0.98 10/07/2011   BUN 21 10/07/2011   CO2 31 10/07/2011   TSH 1.82 09/05/2011   INR 1.25 04/20/2010

## 2012-03-05 NOTE — Assessment & Plan Note (Signed)
stable overall by hx and exam, most recent data reviewed with pt, and pt to continue medical treatment as before SpO2 Readings from Last 3 Encounters:  03/05/12 93%  12/01/11 88%  11/17/11 89%

## 2012-03-05 NOTE — Assessment & Plan Note (Signed)
Diet controlled, The current medical regimen is effective;  continue present plan and medications. No results found for this basename: HGBA1C

## 2012-03-14 ENCOUNTER — Ambulatory Visit
Admission: RE | Admit: 2012-03-14 | Discharge: 2012-03-14 | Disposition: A | Discharge status: Home / Self Care | Payer: Medicare Other | Source: Ambulatory Visit | Attending: Radiation Oncology | Admitting: Radiation Oncology

## 2012-03-14 ENCOUNTER — Encounter: Payer: Self-pay | Admitting: Radiation Oncology

## 2012-03-14 VITALS — BP 116/72 | HR 103 | Temp 97.0°F | Wt 144.0 lb

## 2012-03-14 DIAGNOSIS — C3490 Malignant neoplasm of unspecified part of unspecified bronchus or lung: Secondary | ICD-10-CM

## 2012-03-14 HISTORY — DX: Reserved for concepts with insufficient information to code with codable children: IMO0002

## 2012-03-14 HISTORY — DX: Reserved for inherently not codable concepts without codable children: IMO0001

## 2012-03-14 NOTE — Progress Notes (Signed)
FU today following SBRT for Non-small cell cancer to the RLL.  VSS.  O2 sat 91% while sitting.   Reports SOB if he "walks a distance" and needs to stop and rest. Has intermittent anterior right lower rib pain.  Being followed by Dr. McDiarmitt for lower abd. Pain which he states he thinks is a resurgence of his diverticulitis.

## 2012-03-18 ENCOUNTER — Telehealth: Payer: Self-pay | Admitting: *Deleted

## 2012-03-18 ENCOUNTER — Encounter: Payer: Self-pay | Admitting: Radiation Oncology

## 2012-03-18 ENCOUNTER — Emergency Department (HOSPITAL_COMMUNITY)
Admission: EM | Admit: 2012-03-18 | Discharge: 2012-03-18 | Disposition: A | Discharge status: Home / Self Care | Payer: Medicare Other | Attending: Emergency Medicine | Admitting: Emergency Medicine

## 2012-03-18 ENCOUNTER — Encounter (HOSPITAL_COMMUNITY): Payer: Self-pay | Admitting: *Deleted

## 2012-03-18 DIAGNOSIS — S51809A Unspecified open wound of unspecified forearm, initial encounter: Secondary | ICD-10-CM | POA: Insufficient documentation

## 2012-03-18 DIAGNOSIS — E785 Hyperlipidemia, unspecified: Secondary | ICD-10-CM | POA: Insufficient documentation

## 2012-03-18 DIAGNOSIS — J4489 Other specified chronic obstructive pulmonary disease: Secondary | ICD-10-CM | POA: Insufficient documentation

## 2012-03-18 DIAGNOSIS — Z8546 Personal history of malignant neoplasm of prostate: Secondary | ICD-10-CM | POA: Insufficient documentation

## 2012-03-18 DIAGNOSIS — Z923 Personal history of irradiation: Secondary | ICD-10-CM | POA: Insufficient documentation

## 2012-03-18 DIAGNOSIS — Y92009 Unspecified place in unspecified non-institutional (private) residence as the place of occurrence of the external cause: Secondary | ICD-10-CM | POA: Insufficient documentation

## 2012-03-18 DIAGNOSIS — IMO0002 Reserved for concepts with insufficient information to code with codable children: Secondary | ICD-10-CM | POA: Insufficient documentation

## 2012-03-18 DIAGNOSIS — E119 Type 2 diabetes mellitus without complications: Secondary | ICD-10-CM | POA: Insufficient documentation

## 2012-03-18 DIAGNOSIS — J449 Chronic obstructive pulmonary disease, unspecified: Secondary | ICD-10-CM | POA: Insufficient documentation

## 2012-03-18 DIAGNOSIS — Z85118 Personal history of other malignant neoplasm of bronchus and lung: Secondary | ICD-10-CM | POA: Insufficient documentation

## 2012-03-18 DIAGNOSIS — Z79899 Other long term (current) drug therapy: Secondary | ICD-10-CM | POA: Insufficient documentation

## 2012-03-18 DIAGNOSIS — S51819A Laceration without foreign body of unspecified forearm, initial encounter: Secondary | ICD-10-CM

## 2012-03-18 NOTE — Progress Notes (Signed)
  Radiation Oncology         [phone] ________________________________  Name: Roy [name] MRN: [id number]  Date: 03/14/2012  DOB: 04-23-1921  Follow-Up Visit Note  CC: Oliver Barre, MD  Crecencio Mc, MD  Diagnosis:   78 yo man with clinical stage I Non-small cell lung cancer  Interval Since Last Radiation:  36 months  Narrative:  The patient returns today for routine follow-up.  He is without any new complaints. He occasionally does suffer with dyspnea as well as occasional dry cough. He underwent followup chest CT on 02/06/2012. This showed increasing masslike density at the treatment site raising the possibility for masslike fibrosis versus local recurrence of tumor.                              ALLERGIES:  is allergic to sulfonamide derivatives.  Meds: Current Outpatient Prescriptions  Medication Sig Dispense Refill  . AMBULATORY NON FORMULARY MEDICATION Oxygen At bedtime as directed         . aspirin EC 81 MG tablet Take 81 mg by mouth daily.        . beclomethasone (QVAR) 80 MCG/ACT inhaler Inhale 1 puff into the lungs 2 (two) times daily.  3 Inhaler  3  . Cholecalciferol (VITAMIN D3 SUPER STRENGTH) 2000 UNITS TABS Take 1 tablet by mouth daily.      . clonazePAM (KLONOPIN) 2 MG tablet Take 2 mg by mouth 2 (two) times daily.       . Linaclotide (LINZESS) 145 MCG CAPS Take 1 capsule by mouth daily.  20 capsule  0  . montelukast (SINGULAIR) 10 MG tablet Take 1 tablet (10 mg total) by mouth at bedtime.  90 tablet  3  . Probiotic Product (ALIGN) 4 MG CAPS Take 1 capsule by mouth daily.  14 capsule  0  . tiotropium (SPIRIVA) 18 MCG inhalation capsule Place 18 mcg into inhaler and inhale daily.        Marland Kitchen triamcinolone (NASACORT) 55 MCG/ACT nasal inhaler Place 2 sprays into the nose daily as needed. Allergies.  3 Inhaler  3  . fish oil-omega-3 fatty acids 1000 MG capsule Take 1 g by mouth daily.       Marland Kitchen DISCONTD: DICYCLOMINE HCL PO Take 1 tablet by mouth daily.         Physical  Findings: The patient is in no acute distress. Patient is alert and oriented. The patient appears somewhat frail  weight is 144 lb (65.318 kg). His temperature is 97 F (36.1 C). His blood pressure is 116/72 and his pulse is 103. Marland Kitchen Respiratory effort is unremarkable No significant changes.  Impression:  The patient remains stable from a clinical perspective. He does have increasing density on followup chest CT and may benefit from PET CT.   Plan:  Today, we'll order a PET/CT and will followup on the results. _____________________________________  Artist Pais Kathrynn Running, M.D.

## 2012-03-18 NOTE — ED Provider Notes (Signed)
History     CSN: [id number]  Arrival date & time 03/18/12  1322   First MD Initiated Contact with Patient 03/18/12 1339      Chief Complaint  Patient presents with  . Arm Pain    (Consider location/radiation/quality/duration/timing/severity/associated sxs/prior treatment) HPI Patient presents emergency Dept. with a left forearm skin tear that occurred 2 days ago.  Patient, states this happens regularly, when he bumped his arm against things.  Patient, states that his son-in-law felt he needed to have the area checked and that is what brought him into the emergency department today.  Patient, states that he bumped his arm against a dinner tray table at home.  Patient denies any other injuries. Past Medical History  Diagnosis Date  . Anxiety   . Asthma   . Depression   . Kidney stones     hx of   . Prostate cancer 2005    gleason 4+4=8  . Pulmonary fibrosis   . Lung cancer 2010    RLL  . Hx of radiation therapy may 2010 thru june 2010    5 tx, stereotactic radiotherapy  . Tremor, essential   . COPD (chronic obstructive pulmonary disease)   . DM (diabetes mellitus)   . Urethral stricture   . Normocytic anemia   . Diverticulosis   . Allergic rhinitis   . Hyperlipidemia   . Adenomatous colon polyp   . Chronic left shoulder pain   . Chronic constipation   . Hemorrhoids   . Cellulitis of leg, right   . Edema   . Radiation May/June 2010    stereotactic body radiotherapy    Past Surgical History  Procedure Date  . Appendectomy   . Hdr brachytherapy implant for prostate cancer 2005  . Cataract extraction, bilateral   . Internal urethrotomy 12/09/2008  . Colonoscopy October 2008    Diverticulosis, hemorrhoids  . Tonsillectomy 1943    Family History  Problem Relation Age of Onset  . Prostate cancer Brother   . Colon cancer Sister   . Cancer Father     bladder  . Lung cancer Brother   . Heart disease Brother     History  Substance Use Topics  . Smoking  status: Former Smoker -- 1.0 packs/day for 35 years    Quit date: 08/14/1989  . Smokeless tobacco: Never Used  . Alcohol Use: No     beer occassionally      Review of Systems All other systems negative except as documented in the HPI. All pertinent positives and negatives as reviewed in the HPI.  Allergies  Sulfonamide derivatives  Home Medications   Current Outpatient Rx  Name Route Sig Dispense Refill  . AMBULATORY NON FORMULARY MEDICATION  Oxygen At bedtime as directed       . ASPIRIN EC 81 MG PO TBEC Oral Take 81 mg by mouth daily.      . BECLOMETHASONE DIPROPIONATE 80 MCG/ACT IN AERS Inhalation Inhale 1 puff into the lungs 2 (two) times daily. 3 Inhaler 3  . CHOLECALCIFEROL 2000 UNITS PO TABS Oral Take 1 tablet by mouth daily.    Marland Kitchen CLONAZEPAM 2 MG PO TABS Oral Take 2 mg by mouth 2 (two) times daily.     . OMEGA-3 FATTY ACIDS 1000 MG PO CAPS Oral Take 1 g by mouth daily.     Marland Kitchen MONTELUKAST SODIUM 10 MG PO TABS Oral Take 10 mg by mouth at bedtime.    Marland Kitchen ALIGN 4 MG PO CAPS Oral  Take 1 capsule by mouth daily. 14 capsule 0    Lot # [id number] Exp date: 04/2012  . TIOTROPIUM BROMIDE MONOHYDRATE 18 MCG IN CAPS Inhalation Place 18 mcg into inhaler and inhale daily.      . TRIAMCINOLONE ACETONIDE 55 MCG/ACT NA INHA Nasal Place 2 sprays into the nose daily as needed. Allergies. 3 Inhaler 3    BP 113/63  Pulse 102  Temp 98 F (36.7 C) (Oral)  Resp 18  SpO2 98%  Physical Exam  Nursing note and vitals reviewed. Constitutional: He appears well-developed and well-nourished.  Skin:       ED Course  Procedures (including critical care time)  Labs Reviewed - No data to display No results found.  I cleaned the wound and that the skin flap back and used Steri-Strips to make a biological dressing with the skin tear.  Patient is, advised to return here as needed.  Told to followup with his doctor for recheck   MDM          Carlyle Dolly, PA-C 03/18/12 1459

## 2012-03-18 NOTE — ED Notes (Signed)
Dr. Denton Lank in room

## 2012-03-18 NOTE — ED Notes (Signed)
Pt has skin tear to left forearm, bleeding controlled. Pt reports easily bruises and has frequent tears.

## 2012-03-18 NOTE — Telephone Encounter (Signed)
Called patient to inform of test, spoke with patient and he is aware of this test. 

## 2012-03-25 ENCOUNTER — Encounter (HOSPITAL_COMMUNITY)
Admission: RE | Admit: 2012-03-25 | Discharge: 2012-03-25 | Disposition: A | Discharge status: Home / Self Care | Payer: Medicare Other | Source: Ambulatory Visit | Attending: Radiation Oncology | Admitting: Radiation Oncology

## 2012-03-25 DIAGNOSIS — J8410 Pulmonary fibrosis, unspecified: Secondary | ICD-10-CM | POA: Insufficient documentation

## 2012-03-25 DIAGNOSIS — K7460 Unspecified cirrhosis of liver: Secondary | ICD-10-CM | POA: Insufficient documentation

## 2012-03-25 DIAGNOSIS — C3490 Malignant neoplasm of unspecified part of unspecified bronchus or lung: Secondary | ICD-10-CM

## 2012-03-25 DIAGNOSIS — R599 Enlarged lymph nodes, unspecified: Secondary | ICD-10-CM | POA: Insufficient documentation

## 2012-03-25 DIAGNOSIS — K8020 Calculus of gallbladder without cholecystitis without obstruction: Secondary | ICD-10-CM | POA: Insufficient documentation

## 2012-03-25 DIAGNOSIS — N200 Calculus of kidney: Secondary | ICD-10-CM | POA: Insufficient documentation

## 2012-03-25 MED ORDER — FLUDEOXYGLUCOSE F - 18 (FDG) INJECTION
19.0000 | Freq: Once | INTRAVENOUS | Status: AC | PRN
  Administered 2012-03-25: 19 via INTRAVENOUS

## 2012-03-28 ENCOUNTER — Ambulatory Visit
Admission: RE | Admit: 2012-03-28 | Discharge: 2012-03-28 | Disposition: A | Discharge status: Home / Self Care | Payer: Medicare Other | Source: Ambulatory Visit | Attending: Radiation Oncology | Admitting: Radiation Oncology

## 2012-03-28 ENCOUNTER — Ambulatory Visit (HOSPITAL_COMMUNITY): Payer: Medicare Other

## 2012-03-28 VITALS — BP 118/68 | HR 106 | Temp 97.6°F | Wt 144.5 lb

## 2012-03-28 DIAGNOSIS — C3430 Malignant neoplasm of lower lobe, unspecified bronchus or lung: Secondary | ICD-10-CM | POA: Insufficient documentation

## 2012-03-28 DIAGNOSIS — Z510 Encounter for antineoplastic radiation therapy: Secondary | ICD-10-CM | POA: Insufficient documentation

## 2012-03-28 DIAGNOSIS — C3490 Malignant neoplasm of unspecified part of unspecified bronchus or lung: Secondary | ICD-10-CM

## 2012-03-28 NOTE — Progress Notes (Signed)
Patient here for follow up to review PET/CT results.Doing well.denies pain.Shortness of breath on exertion.Oxygen sats 88% which norm is around 85% for this patient. Pet reveals mass-like consolidation in medial right lower lobe worrisome for recurrent disease.

## 2012-03-29 NOTE — Addendum Note (Signed)
Encounter addended by: Oneita Hurt, MD on: 03/29/2012  4:23 PM<BR>     Documentation filed: Orders

## 2012-03-29 NOTE — Progress Notes (Signed)
Radiation Oncology         ([phone]) [phone] ________________________________  Name: BREYSON KELM MRN: [id number]  Date: 03/28/2012  DOB: 01-01-1921  Follow-Up Visit Note  CC: Oliver Barre, MD  Crecencio Mc, MD  Diagnosis:   78 yo man with clinical stage I Non-small cell lung cancer and and and status post stereotactic body radiotherapy to 50 Gy in 5 fractions in May 2010  Interval Since Last Radiation:  36 months  Narrative:  The patient returns today for routine follow-up.  He had a followup PET CT performed to assess enlarging nodule right lower lung. We discussed the findings with his son and daughter and the patient today.         Radiographic Findings: Nm Pet Image Restag (ps) Skull Base To Thigh  03/25/2012  *RADIOLOGY REPORT*  Clinical Data: Subsequent treatment strategy for lung cancer.  NUCLEAR MEDICINE PET SKULL BASE TO THIGH  Fasting Blood Glucose:  102  Technique:  19 mCi F-18 FDG was injected intravenously. CT data was obtained and used for attenuation correction and anatomic localization only.  (This was not acquired as a diagnostic CT examination.) Additional exam technical data entered on technologist worksheet.  Comparison:  CT chest 02/06/2012 and PET 12/16/2008.  Findings:  Neck: No hypermetabolic lymph nodes in the neck. CT images show no acute findings.  Chest:  There is peripheral hypermetabolism associated with mass- like consolidation in the right lower lobe, with an S U V max of 9.8.  No definite hypermetabolic lymph nodes.  CT images show no acute findings.  Extensive pulmonary fibrosis is noted.  Abdomen/Pelvis:  There are enlarged retrocrural and retroperitoneal lymph nodes, progressive from 12/16/2008.  Only one of these lymph nodes is FDG avid, in the aortocaval station, measuring 1.3 cm with an S U V max of 4.8 (CT image 149).  No additional areas of abnormal hypermetabolism in the abdomen or pelvis.  CT images show an irregular liver margin.  Low and high attenuation  lesions are seen in the kidneys, as before.  Right renal stone. Gallstones.  [place]:  No focal hypermetabolic activity to suggest skeletal metastasis.  IMPRESSION:  1.  Peripheral hypermetabolic activity associated with mass-like consolidation in the medial right lower lobe is highly worrisome for recurrent disease in this patient with a history of lung cancer. 2. Retrocrural and retroperitoneal lymph nodes have enlarged from 12/16/2008.  Only one of these lymph nodes is hypermetabolic in the aortocaval station. 3.  Cirrhosis. 4.  Cholelithiasis. 5.  Right renal stone.  Original Report Authenticated By: Reyes Ivan, M.D.   Impression:  The patient has an enlarging mass at the site of his previous stereotactic body radiotherapy. Whether this mass reflects local recurrence of tumor versus masslike fibrosis it is not immediately clear. However, PET scan is suspicious for recurrent disease given the high metabolism and radiographic appearance. In order to distinguish between fibrosis and tumor, a biopsy would be warranted.  Plan:  Today, talked to the patient the findings and recommended biopsy the lung. In terms of extrathoracic disease, it mentioned that the PET/CT did not show clear evidence of any metastatic disease. We didn't talk a great deal about the retroperitoneal lymph nodes but I suspect these should be followed over time. Patient is agreeable to proceeding with biopsy. They understand that the biopsy may show fibrosis or necrotic debris which would lead some support for the possibility of masslike fibrosis, but would not entirely exclude recurrent tumor. They also understand that  if the biopsy does show recurrent cancer, the treatment options may be quite limited. The patient is not a surgical candidate. Reirradiation could be considered and we talked about this today. If his biopsy is positive for recurrent adenocarcinoma, we will request receptor studies to check with the patient is eligible  for any targeted therapy options.  I spent several months attempting to order a CT-guided biopsy of the right lower lung using the electronic medical record. I was unsuccessful, but I think it ordered an interventional radiology consultation.  _____________________________________  Artist Pais Kathrynn Running, M.D.

## 2012-04-04 ENCOUNTER — Other Ambulatory Visit: Payer: Self-pay | Admitting: Radiology

## 2012-04-04 ENCOUNTER — Ambulatory Visit (HOSPITAL_COMMUNITY): Payer: Medicare Other

## 2012-04-04 ENCOUNTER — Other Ambulatory Visit (HOSPITAL_COMMUNITY): Payer: Medicare Other

## 2012-04-06 NOTE — ED Provider Notes (Signed)
Medical screening examination/treatment/procedure(s) were conducted as a shared visit with non-physician practitioner(s) and myself.  I personally evaluated the patient during the encounter Pt with recent skin tear. No cellulitis.   Suzi Roots, MD 04/06/12 [phone]

## 2012-04-09 ENCOUNTER — Other Ambulatory Visit: Payer: Self-pay | Admitting: Radiology

## 2012-04-09 ENCOUNTER — Encounter (HOSPITAL_COMMUNITY): Payer: Self-pay | Admitting: Pharmacy Technician

## 2012-04-10 ENCOUNTER — Telehealth: Payer: Self-pay | Admitting: Radiation Oncology

## 2012-04-10 NOTE — Telephone Encounter (Signed)
Phoned for clarification on biopsy location, date and time. Informed patient of appointment and he verbalized understanding.

## 2012-04-11 ENCOUNTER — Ambulatory Visit (HOSPITAL_COMMUNITY)
Admission: RE | Admit: 2012-04-11 | Discharge: 2012-04-11 | Disposition: A | Discharge status: Home / Self Care | Payer: Medicare Other | Source: Ambulatory Visit | Attending: Diagnostic Radiology | Admitting: Diagnostic Radiology

## 2012-04-11 ENCOUNTER — Ambulatory Visit (HOSPITAL_COMMUNITY)
Admission: RE | Admit: 2012-04-11 | Discharge: 2012-04-11 | Disposition: A | Discharge status: Home / Self Care | Payer: Medicare Other | Source: Ambulatory Visit | Attending: Radiation Oncology | Admitting: Radiation Oncology

## 2012-04-11 ENCOUNTER — Encounter (HOSPITAL_COMMUNITY): Payer: Self-pay

## 2012-04-11 DIAGNOSIS — Z923 Personal history of irradiation: Secondary | ICD-10-CM | POA: Insufficient documentation

## 2012-04-11 DIAGNOSIS — Z87891 Personal history of nicotine dependence: Secondary | ICD-10-CM | POA: Insufficient documentation

## 2012-04-11 DIAGNOSIS — C3430 Malignant neoplasm of lower lobe, unspecified bronchus or lung: Secondary | ICD-10-CM | POA: Insufficient documentation

## 2012-04-11 DIAGNOSIS — C3490 Malignant neoplasm of unspecified part of unspecified bronchus or lung: Secondary | ICD-10-CM

## 2012-04-11 DIAGNOSIS — Z8546 Personal history of malignant neoplasm of prostate: Secondary | ICD-10-CM | POA: Insufficient documentation

## 2012-04-11 DIAGNOSIS — J438 Other emphysema: Secondary | ICD-10-CM | POA: Insufficient documentation

## 2012-04-11 DIAGNOSIS — E785 Hyperlipidemia, unspecified: Secondary | ICD-10-CM | POA: Insufficient documentation

## 2012-04-11 DIAGNOSIS — Z79899 Other long term (current) drug therapy: Secondary | ICD-10-CM | POA: Insufficient documentation

## 2012-04-11 DIAGNOSIS — E119 Type 2 diabetes mellitus without complications: Secondary | ICD-10-CM | POA: Insufficient documentation

## 2012-04-11 HISTORY — PX: OTHER SURGICAL HISTORY: SHX169

## 2012-04-11 LAB — CBC
MCH: 25.9 pg — ABNORMAL LOW (ref 26.0–34.0)
MCV: 82.2 fL (ref 78.0–100.0)
Platelets: 124 10*3/uL — ABNORMAL LOW (ref 150–400)
RBC: 4.78 MIL/uL (ref 4.22–5.81)
RDW: 17 % — ABNORMAL HIGH (ref 11.5–15.5)

## 2012-04-11 MED ORDER — SODIUM CHLORIDE 0.9 % IV SOLN
INTRAVENOUS | Status: DC
  Administered 2012-04-11: 10:00:00 via INTRAVENOUS

## 2012-04-11 MED ORDER — CLONAZEPAM 1 MG PO TABS
2.0000 mg | ORAL_TABLET | Freq: Once | ORAL | Status: AC
  Administered 2012-04-11: 2 mg via ORAL
  Filled 2012-04-11: qty 2

## 2012-04-11 MED ORDER — FENTANYL CITRATE 0.05 MG/ML IJ SOLN
INTRAMUSCULAR | Status: AC | PRN
  Administered 2012-04-11 (×2): 25 ug via INTRAVENOUS

## 2012-04-11 MED ORDER — MIDAZOLAM HCL 5 MG/5ML IJ SOLN
INTRAMUSCULAR | Status: AC | PRN
  Administered 2012-04-11: 0.5 mg via INTRAVENOUS

## 2012-04-11 NOTE — ED Notes (Signed)
Specimens being collected.

## 2012-04-11 NOTE — Progress Notes (Signed)
CXR done at 1310. No pneumothorax. Paged Dr Lowella Dandy. Verbal order received that pt may eat lunch.

## 2012-04-11 NOTE — Procedures (Signed)
CT guided right lung lesion biopsy. 3 cores obtained.  No immediate complication.

## 2012-04-11 NOTE — H&P (Signed)
Roy [name] is an 78 y.o. male.   Chief Complaint: right lung mass HPI: Patient with history of NSC right lung carcinoma 2010 and recent PET scan demonstrating a hypermetabolic RLL mass suspicious for recurrence. He presents today for CT guided biopsy of the right lung mass.  Past Medical History  Diagnosis Date  . Anxiety   . Asthma   . Depression   . Kidney stones     hx of   . Prostate cancer 2005    gleason 4+4=8  . Pulmonary fibrosis   . Lung cancer 2010    RLL  . Hx of radiation therapy may 2010 thru june 2010    5 tx, stereotactic radiotherapy  . Tremor, essential   . COPD (chronic obstructive pulmonary disease)   . DM (diabetes mellitus)   . Urethral stricture   . Normocytic anemia   . Diverticulosis   . Allergic rhinitis   . Hyperlipidemia   . Adenomatous colon polyp   . Chronic left shoulder pain   . Chronic constipation   . Hemorrhoids   . Cellulitis of leg, right   . Edema   . Radiation May/June 2010    stereotactic body radiotherapy    Past Surgical History  Procedure Date  . Appendectomy   . Hdr brachytherapy implant for prostate cancer 2005  . Cataract extraction, bilateral   . Internal urethrotomy 12/09/2008  . Colonoscopy October 2008    Diverticulosis, hemorrhoids  . Tonsillectomy 1943    Family History  Problem Relation Age of Onset  . Prostate cancer Brother   . Colon cancer Sister   . Cancer Father     bladder  . Lung cancer Brother   . Heart disease Brother    Social History:  reports that he quit smoking about 22 years ago. He has never used smokeless tobacco. He reports that he does not drink alcohol or use illicit drugs.  Allergies:  Allergies  Allergen Reactions  . Iodine Nausea Only  . Sulfonamide Derivatives Nausea And Vomiting    Current outpatient prescriptions:AMBULATORY NON FORMULARY MEDICATION, Oxygen At bedtime as directed   , Disp: , Rfl: ;  aspirin EC 81 MG tablet, Take 81 mg by mouth every morning. , Disp: , Rfl: ;   beclomethasone (QVAR) 80 MCG/ACT inhaler, Inhale 1 puff into the lungs 2 (two) times daily., Disp: 3 Inhaler, Rfl: 3;  Cholecalciferol (VITAMIN D3 SUPER STRENGTH) 2000 UNITS TABS, Take 1 tablet by mouth daily., Disp: , Rfl:  clonazePAM (KLONOPIN) 2 MG tablet, Take 2 mg by mouth 2 (two) times daily. , Disp: , Rfl: ;  fish oil-omega-3 fatty acids 1000 MG capsule, Take 1 g by mouth daily. , Disp: , Rfl: ;  montelukast (SINGULAIR) 10 MG tablet, Take 10 mg by mouth at bedtime., Disp: , Rfl: ;  Probiotic Product (ALIGN) 4 MG CAPS, Take 1 capsule by mouth daily., Disp: 14 capsule, Rfl: 0 tiotropium (SPIRIVA) 18 MCG inhalation capsule, Place 18 mcg into inhaler and inhale daily.  , Disp: , Rfl: ;  triamcinolone (NASACORT) 55 MCG/ACT nasal inhaler, Place 2 sprays into the nose daily as needed. Allergies., Disp: 3 Inhaler, Rfl: 3;  DISCONTD: DICYCLOMINE HCL PO, Take 1 tablet by mouth daily. , Disp: , Rfl:  Current facility-administered medications:0.9 %  sodium chloride infusion, , Intravenous, Continuous, Brayton El, PA  Results for orders placed during the hospital encounter of 03/25/12  GLUCOSE, CAPILLARY      Component Value Range   Glucose-Capillary 102 (*)  70 - 99 mg/dL   Results for orders placed during the hospital encounter of 04/11/12  APTT      Component Value Range   aPTT 37  24 - 37 seconds  CBC      Component Value Range   WBC 6.7  4.0 - 10.5 K/uL   RBC 4.78  4.22 - 5.81 MIL/uL   Hemoglobin 12.4 (*) 13.0 - 17.0 g/dL   HCT 16.1  09.6 - 04.5 %   MCV 82.2  78.0 - 100.0 fL   MCH 25.9 (*) 26.0 - 34.0 pg   MCHC 31.6  30.0 - 36.0 g/dL   RDW 40.9 (*) 81.1 - 91.4 %   Platelets 124 (*) 150 - 400 K/uL  PROTIME-INR      Component Value Range   Prothrombin Time 14.7  11.6 - 15.2 seconds   INR 1.13  0.00 - 1.49     Review of Systems  Constitutional: Negative for fever.  Respiratory: Positive for cough and shortness of breath.   Cardiovascular: Negative for chest pain.  Gastrointestinal:  Negative for nausea, vomiting and abdominal pain.  Musculoskeletal: Positive for back pain.  Neurological: Positive for tremors. Negative for headaches.    Blood pressure 121/73, pulse 78, temperature 96.9 F (36.1 C), temperature source Oral, resp. rate 18, SpO2 92.00%. Physical Exam  Constitutional: He is oriented to person, place, and time.       Thin WM in NAD  Cardiovascular: Normal rate and regular rhythm.   Respiratory: Effort normal.       Scattered crackles, primarily in both bases  GI: Soft. Bowel sounds are normal. There is no tenderness.  Musculoskeletal: Normal range of motion. He exhibits edema.  Neurological: He is alert and oriented to person, place, and time.     Assessment/Plan Patient with history of NSC right lung carcinoma and hypermetabolic RLL lung mass on recent PET scan; plan is for CT guided right lung mass biopsy today. Details/risks of procedure d/w pt/daughter with their understanding and consent.  Madinah Quarry,D KEVIN 04/11/2012, 10:19 AM

## 2012-04-13 ENCOUNTER — Emergency Department (HOSPITAL_COMMUNITY)
Admission: EM | Admit: 2012-04-13 | Discharge: 2012-04-13 | Disposition: A | Discharge status: Home / Self Care | Payer: Medicare Other | Attending: Emergency Medicine | Admitting: Emergency Medicine

## 2012-04-13 ENCOUNTER — Emergency Department (HOSPITAL_COMMUNITY): Payer: Medicare Other

## 2012-04-13 ENCOUNTER — Encounter (HOSPITAL_COMMUNITY): Payer: Self-pay | Admitting: *Deleted

## 2012-04-13 DIAGNOSIS — R42 Dizziness and giddiness: Secondary | ICD-10-CM | POA: Insufficient documentation

## 2012-04-13 DIAGNOSIS — W19XXXA Unspecified fall, initial encounter: Secondary | ICD-10-CM

## 2012-04-13 DIAGNOSIS — IMO0002 Reserved for concepts with insufficient information to code with codable children: Secondary | ICD-10-CM

## 2012-04-13 DIAGNOSIS — M25559 Pain in unspecified hip: Secondary | ICD-10-CM | POA: Insufficient documentation

## 2012-04-13 DIAGNOSIS — Z79899 Other long term (current) drug therapy: Secondary | ICD-10-CM | POA: Insufficient documentation

## 2012-04-13 DIAGNOSIS — Z7982 Long term (current) use of aspirin: Secondary | ICD-10-CM | POA: Insufficient documentation

## 2012-04-13 LAB — CBC WITH DIFFERENTIAL/PLATELET
Eosinophils Relative: 3 % (ref 0–5)
HCT: 36.2 % — ABNORMAL LOW (ref 39.0–52.0)
Lymphocytes Relative: 10 % — ABNORMAL LOW (ref 12–46)
Lymphs Abs: 0.6 10*3/uL — ABNORMAL LOW (ref 0.7–4.0)
MCV: 81.5 fL (ref 78.0–100.0)
Monocytes Absolute: 0.5 10*3/uL (ref 0.1–1.0)
RDW: 17 % — ABNORMAL HIGH (ref 11.5–15.5)
WBC: 6.5 10*3/uL (ref 4.0–10.5)

## 2012-04-13 LAB — COMPREHENSIVE METABOLIC PANEL
CO2: 32 mEq/L (ref 19–32)
Calcium: 9 mg/dL (ref 8.4–10.5)
Creatinine, Ser: 0.91 mg/dL (ref 0.50–1.35)
GFR calc Af Amer: 85 mL/min — ABNORMAL LOW (ref >90)
GFR calc non Af Amer: 73 mL/min — ABNORMAL LOW (ref >90)
Glucose, Bld: 104 mg/dL — ABNORMAL HIGH (ref 70–99)

## 2012-04-13 NOTE — ED Notes (Signed)
MD at bedside. 

## 2012-04-13 NOTE — ED Provider Notes (Signed)
History     CSN: [id number]  Arrival date & time 04/13/12  4098   First MD Initiated Contact with Patient 04/13/12 0701      Chief Complaint  Patient presents with  . Fall  . Hand Injury    Skin tear to left hand    (Consider location/radiation/quality/duration/timing/severity/associated sxs/prior treatment) HPI  Patient relates he got up in the middle the night to use the bathroom. He states he was using his walker. He had acute onset of dizziness which is described as spinning which she's had in the past. He states he has essential tremor in his hands started shaking really badly and he lost his balance which she's also had many times in the past and he fell backwards. He states he hit his head but denies loss of consciousness. He states his only pain is he has tenderness in his left hip. He also has a skin tear on his left hand and left lower leg. He denies any headache or neck pain. He denies any pain in his hands or in his left lower leg. He presents via EMS.  Son states that patient had a lung biopsy done 2 days ago. He also had to doctor's appointments yesterday and had some dizzy spells which she relates to possibly having the sedatives from the lung biopsy.  PCP Dr. Jonny Ruiz Urology Dr McDiarmid and Laverle Patter Radiation Oncologist Dr Kathrynn Running  Past Medical History  Diagnosis Date  . Anxiety   . Asthma   . Depression   . Kidney stones     hx of   . Prostate cancer 2005    gleason 4+4=8  . Pulmonary fibrosis   . Lung cancer 2010    RLL  . Hx of radiation therapy may 2010 thru june 2010    5 tx, stereotactic radiotherapy  . Tremor, essential   . COPD (chronic obstructive pulmonary disease)   . DM (diabetes mellitus)   . Urethral stricture   . Normocytic anemia   . Diverticulosis   . Allergic rhinitis   . Hyperlipidemia   . Adenomatous colon polyp   . Chronic left shoulder pain   . Chronic constipation   . Hemorrhoids   . Cellulitis of leg, right   . Edema   .  Radiation May/June 2010    stereotactic body radiotherapy    Past Surgical History  Procedure Date  . Appendectomy   . Hdr brachytherapy implant for prostate cancer 2005  . Cataract extraction, bilateral   . Internal urethrotomy 12/09/2008  . Colonoscopy October 2008    Diverticulosis, hemorrhoids  . Tonsillectomy 1943    Family History  Problem Relation Age of Onset  . Prostate cancer Brother   . Colon cancer Sister   . Cancer Father     bladder  . Lung cancer Brother   . Heart disease Brother    Last tetanus less than 10 years  History  Substance Use Topics  . Smoking status: Former Smoker -- 1.0 packs/day for 35 years    Quit date: 08/14/1989  . Smokeless tobacco: Never Used  . Alcohol Use: No     beer occassionally   Lives alone, son lives next door Home oxygen 2 lpm [place] at night prn during the day Uses a walker   Review of Systems  All other systems reviewed and are negative.    Allergies  Iodine and Sulfonamide derivatives  Home Medications   Current Outpatient Rx  Name Route Sig Dispense Refill  .  AMBULATORY NON FORMULARY MEDICATION  Oxygen At bedtime as directed       . ASPIRIN EC 81 MG PO TBEC Oral Take 81 mg by mouth every morning.     . BECLOMETHASONE DIPROPIONATE 80 MCG/ACT IN AERS Inhalation Inhale 1 puff into the lungs 2 (two) times daily.    . CHOLECALCIFEROL 2000 UNITS PO TABS Oral Take 1 tablet by mouth daily.    Marland Kitchen CLONAZEPAM 2 MG PO TABS Oral Take 2 mg by mouth 2 (two) times daily.     . OMEGA-3 FATTY ACIDS 1000 MG PO CAPS Oral Take 1 g by mouth daily.     Marland Kitchen MONTELUKAST SODIUM 10 MG PO TABS Oral Take 10 mg by mouth at bedtime.    Marland Kitchen ALIGN 4 MG PO CAPS Oral Take 1 capsule by mouth daily.    Marland Kitchen TIOTROPIUM BROMIDE MONOHYDRATE 18 MCG IN CAPS Inhalation Place 18 mcg into inhaler and inhale daily.      . TRIAMCINOLONE ACETONIDE 55 MCG/ACT NA INHA Nasal Place 2 sprays into the nose as needed. For allergies      BP 119/66  Pulse 95  Temp 98.4  F (36.9 C) (Oral)  Resp 19  SpO2 87%  Vital signs normal    Physical Exam  Nursing note and vitals reviewed. Constitutional: He is oriented to person, place, and time. He appears well-developed and well-nourished.  Non-toxic appearance. He does not appear ill. No distress.       Thin elderly male who sometimes is hard to understand because of his tremor his voice also has tremor  HENT:  Head: Normocephalic and atraumatic.  Right Ear: External ear normal.  Left Ear: External ear normal.  Nose: Nose normal. No mucosal edema or rhinorrhea.  Mouth/Throat: Oropharynx is clear and moist and mucous membranes are normal. No dental abscesses or uvula swelling.  Eyes: Conjunctivae and EOM are normal. Pupils are equal, round, and reactive to light.  Neck: Normal range of motion and full passive range of motion without pain. Neck supple.  Cardiovascular: Normal rate, regular rhythm and normal heart sounds.  Exam reveals no gallop and no friction rub.   No murmur heard. Pulmonary/Chest: Effort normal and breath sounds normal. No respiratory distress. He has no wheezes. He has no rhonchi. He has no rales. He exhibits no tenderness and no crepitus.  Abdominal: Soft. Normal appearance and bowel sounds are normal. He exhibits no distension. There is no tenderness. There is no rebound and no guarding.  Musculoskeletal: Normal range of motion. He exhibits no edema and no tenderness.       Moves all extremities well.  Patient's noted to have a bruise and swelling over the greater trochanter of his left hip. He has good range of motion of his hip. He said to have a small skin tear over his proximal lateral aspect of his left lower leg Pt has a Y shaped long laceration on the dorsum of his left hand with a lot of bruising. He has no pain with ROM of his hand.   Neurological: He is alert and oriented to person, place, and time. He has normal strength. No cranial nerve deficit.  Skin: Skin is warm, dry and  intact. No rash noted. No erythema. No pallor.  Psychiatric: He has a normal mood and affect. His speech is normal and behavior is normal. His mood appears not anxious.    ED Course  Procedures (including critical care time)  Tegaderm was applied to his skin tears.  As discussed with his son his skin on the dorsum of his hand is extremely thin and would just rip with suturing.  Patient's son works at [employer] urology. He states patient had a urinealysis done there this week. He showed me his report, his UA on 8/16 was normal.   Results for orders placed during the hospital encounter of 04/13/12  CBC WITH DIFFERENTIAL      Component Value Range   WBC 6.5  4.0 - 10.5 K/uL   RBC 4.44  4.22 - 5.81 MIL/uL   Hemoglobin 11.7 (*) 13.0 - 17.0 g/dL   HCT 16.1 (*) 09.6 - 04.5 %   MCV 81.5  78.0 - 100.0 fL   MCH 26.4  26.0 - 34.0 pg   MCHC 32.3  30.0 - 36.0 g/dL   RDW 40.9 (*) 81.1 - 91.4 %   Platelets 125 (*) 150 - 400 K/uL   Neutrophils Relative 79 (*) 43 - 77 %   Neutro Abs 5.1  1.7 - 7.7 K/uL   Lymphocytes Relative 10 (*) 12 - 46 %   Lymphs Abs 0.6 (*) 0.7 - 4.0 K/uL   Monocytes Relative 8  3 - 12 %   Monocytes Absolute 0.5  0.1 - 1.0 K/uL   Eosinophils Relative 3  0 - 5 %   Eosinophils Absolute 0.2  0.0 - 0.7 K/uL   Basophils Relative 0  0 - 1 %   Basophils Absolute 0.0  0.0 - 0.1 K/uL  COMPREHENSIVE METABOLIC PANEL      Component Value Range   Sodium 137  135 - 145 mEq/L   Potassium 3.7  3.5 - 5.1 mEq/L   Chloride 100  96 - 112 mEq/L   CO2 32  19 - 32 mEq/L   Glucose, Bld 104 (*) 70 - 99 mg/dL   BUN 21  6 - 23 mg/dL   Creatinine, Ser 7.82  0.50 - 1.35 mg/dL   Calcium 9.0  8.4 - 95.6 mg/dL   Total Protein 7.0  6.0 - 8.3 g/dL   Albumin 2.9 (*) 3.5 - 5.2 g/dL   AST 18  0 - 37 U/L   ALT 12  0 - 53 U/L   Alkaline Phosphatase 82  39 - 117 U/L   Total Bilirubin 0.4  0.3 - 1.2 mg/dL   GFR calc non Af Amer 73 (*) >90 mL/min   GFR calc Af Amer 85 (*) >90 mL/min   Laboratory  interpretation all normal except mild stable anemia  Dg Chest 1 View  04/11/2012  *RADIOLOGY REPORT*  Clinical Data: Right lung biopsy, evaluate for pneumothorax  CHEST - 1 VIEW  Comparison: Chest x-ray of 10/07/2011 and is c t biopsy images of 04/11/2012  Findings: Chronic interstitial changes are noted bilaterally. Right infrahilar mass is again noted.  No pneumothorax is seen. Heart size is stable.  IMPRESSION: No pneumothorax after biopsy.  Original Report Authenticated By: Juline Patch, M.D.   Dg Hip Complete Left  04/13/2012  *RADIOLOGY REPORT*  Clinical Data: Status post fall.  Left hip pain.  LEFT HIP - COMPLETE 2+ VIEW  Comparison: Plain films 12/11/2009.  Findings: Both hips are located.  There is no fracture.  Mild appearing degenerative change is present about the hips. Small high attenuation foci projecting over the left pelvis are consistent with contrast in diverticula from esophagram performed 01/01/2012.  IMPRESSION: No acute finding.  Original Report Authenticated By: Bernadene Bell. Maricela Curet, M.D.     1. Marletta Lor  2. Skin tear     Plan discharge  Devoria Albe, MD, FACEP   MDM          Ward Givens, MD 04/13/12 [phone]

## 2012-04-13 NOTE — ED Notes (Signed)
Put nasal cannula with 2L of O2 to improve stats. Now @ 94%

## 2012-04-13 NOTE — ED Notes (Signed)
Per EMS, pt from home after fall with skin tear to left hand as well as bruise to left hip but pt denies pain and walked over to stretcher.

## 2012-04-13 NOTE — ED Notes (Signed)
Patient transported to X-ray 

## 2012-04-15 ENCOUNTER — Telehealth: Payer: Self-pay | Admitting: Internal Medicine

## 2012-04-15 ENCOUNTER — Encounter: Payer: Self-pay | Admitting: Radiation Oncology

## 2012-04-15 ENCOUNTER — Ambulatory Visit
Admission: RE | Admit: 2012-04-15 | Discharge: 2012-04-15 | Disposition: A | Discharge status: Home / Self Care | Payer: Medicare Other | Source: Ambulatory Visit | Attending: Radiation Oncology | Admitting: Radiation Oncology

## 2012-04-15 VITALS — BP 118/63 | HR 87 | Temp 96.9°F | Wt 143.8 lb

## 2012-04-15 DIAGNOSIS — C3490 Malignant neoplasm of unspecified part of unspecified bronchus or lung: Secondary | ICD-10-CM

## 2012-04-15 NOTE — Progress Notes (Signed)
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  He has CT biopsy the right lower lung mass. This unfortunately was positive for malignancy. However, it showed a new histology with squamous cell differentiation. His originally treated right lower lung mass was felt to be non-small cell carcinoma and possibly adenocarcinoma based on fine needle aspirate. The patient was recently managed in the emergency room following dizziness and fall. He is feeling better today.                              ALLERGIES:  is allergic to iodine and sulfonamide derivatives.  Meds: Current Outpatient Prescriptions  Medication Sig Dispense Refill  . AMBULATORY NON FORMULARY MEDICATION Oxygen At bedtime as directed         . aspirin EC 81 MG tablet Take 81 mg by mouth every morning.       . beclomethasone (QVAR) 80 MCG/ACT inhaler Inhale 1 puff into the lungs 2 (two) times daily.      . Cholecalciferol (VITAMIN D3 SUPER STRENGTH) 2000 UNITS TABS Take 1 tablet by mouth daily.      . clonazePAM (KLONOPIN) 2 MG tablet Take 2 mg by mouth 2 (two) times daily.       . fish oil-omega-3 fatty acids 1000 MG capsule Take 1 g by mouth daily.       . montelukast (SINGULAIR) 10 MG tablet Take 10 mg by mouth at bedtime.      . Probiotic Product (ALIGN) 4 MG CAPS Take 1 capsule by mouth daily.      Marland Kitchen tiotropium (SPIRIVA) 18 MCG inhalation capsule Place 18 mcg into inhaler and inhale daily.        Marland Kitchen triamcinolone (NASACORT) 55 MCG/ACT nasal inhaler Place 2 sprays into the nose as needed. For allergies      . DISCONTD: DICYCLOMINE HCL PO Take 1 tablet by mouth daily.          Physical Findings: The patient is in no acute distress. Patient is alert and oriented.  weight is 143 lb 12.8 oz (65.227 kg). His temperature is 96.9 F (36.1 C). His blood pressure is 118/63 and his pulse is 87. His oxygen saturation is 91%. .  No significant changes.  Lab Findings: Lab Results  Component Value Date   WBC 6.5 04/13/2012   HGB 11.7* 04/13/2012   HCT 36.2* 04/13/2012   MCV 81.5 04/13/2012   PLT 125* 04/13/2012    @LASTCHEM @  Radiographic Findings: Dg Chest 1 View  04/11/2012  *RADIOLOGY REPORT*  Clinical Data: Right lung biopsy, evaluate for pneumothorax  CHEST - 1 VIEW  Comparison: Chest x-ray of 10/07/2011 and is c t biopsy images of 04/11/2012  Findings: Chronic interstitial changes are noted bilaterally. Right infrahilar mass is again noted.  No pneumothorax is seen. Heart size is stable.  IMPRESSION: No pneumothorax after biopsy.  Original Report Authenticated By: Juline Patch, M.D.   Dg Hip Complete Left  04/13/2012  *RADIOLOGY REPORT*  Clinical Data: Status post fall.  Left hip pain.  LEFT HIP - COMPLETE 2+ VIEW  Comparison: Plain films 12/11/2009.  Findings: Both hips are located.  There is no fracture.  Mild appearing degenerative change is present about the hips. Small high attenuation foci projecting over the left pelvis are consistent with contrast in diverticula from esophagram performed 01/01/2012.  IMPRESSION: No acute finding.  Original Report Authenticated By: Bernadene Bell. Maricela Curet, M.D.   Nm Pet Image Restag (ps) Skull Base To Thigh  03/25/2012  *RADIOLOGY REPORT*  Clinical Data: Subsequent treatment strategy for lung cancer.  NUCLEAR MEDICINE PET SKULL BASE TO THIGH  Fasting Blood Glucose:  102  Technique:  19 mCi F-18 FDG was injected intravenously. CT data was obtained and used for attenuation correction and anatomic localization only.  (This was not acquired as a diagnostic CT examination.) Additional exam technical data entered on technologist  worksheet.  Comparison:  CT chest 02/06/2012 and PET 12/16/2008.  Findings:  Neck: No hypermetabolic lymph nodes in the neck. CT images show no acute findings.  Chest:  There is peripheral hypermetabolism associated with mass- like consolidation in the right lower lobe, with an S U V max of 9.8.  No definite hypermetabolic lymph nodes.  CT images show no acute findings.  Extensive pulmonary fibrosis is noted.  Abdomen/Pelvis:  There are enlarged retrocrural and retroperitoneal lymph nodes, progressive from 12/16/2008.  Only one of these lymph nodes is FDG avid, in the aortocaval station, measuring 1.3 cm with an S U V max of 4.8 (CT image 149).  No additional areas of abnormal hypermetabolism in the abdomen or pelvis.  CT images show an irregular liver margin.  Low and high attenuation lesions are seen in the kidneys, as before.  Right renal stone. Gallstones.  [place]:  No focal hypermetabolic activity to suggest skeletal metastasis.  IMPRESSION:  1.  Peripheral hypermetabolic activity associated with mass-like consolidation in the medial right lower lobe is highly worrisome for recurrent disease in this patient with a history of lung cancer. 2. Retrocrural and retroperitoneal lymph nodes have enlarged from 12/16/2008.  Only one of these lymph nodes is hypermetabolic in the aortocaval station. 3.  Cirrhosis. 4.  Cholelithiasis. 5.  Right renal stone.  Original Report Authenticated By: Reyes Ivan, M.D.   Ct Biopsy  04/11/2012  *RADIOLOGY REPORT*  Clinical history:78 year old with history of right lung cancer. History of radiation therapy to the right lung lesion.  Concern for recurrence.  PROCEDURE(S): CT GUIDED BIOPSY OF RIGHT LUNG LESION  Physician: Rachelle Hora. Henn, MD  Medications:Versed 0.5 mg, Fentanyl 50 mcg. A radiology nurse monitored the patient for moderate sedation.  Moderate sedation time:15 minutes  Procedure:Informed consent was obtained for a right lung biopsy. The patient was placed in a right  lateral decubitus position. Images through the chest were obtained.  The lesion in the posterior right lower lobe was identified.  The patient's right back was prepped and draped in a sterile fashion and the skin was anesthetized with lidocaine.  A 17 gauge needle was directed into the posterior lesion with CT guidance.  Three core biopsies were obtained with an 18 gauge core device.  Needle removed without complication.  Findings:The patient has severe emphysematous disease. There is a lesion in the posterior right lower lobe.  Needle placement was confirmed within this lesion.  Complications: None  Impression:CT guided core biopsies of the right lung lesion.  Original Report Authenticated By: Richarda Overlie, M.D.   Impression:  The patient received stereotactic body radiotherapy to a right lower lung mass felt to reflect adenocarcinoma in 2010.  He now has an enlarging mass at that same site and biopsy shows squamous cell carcinoma. Difference in histologies is not entirely clear. It may represent a transformation of cell types or a mixed tumor with recurrence of predominant squamous component.  Plan:  Mr. patient has an enlarging right lower lung mass which is now been biopsy proven to be squamous or carcinoma. He is not an ideal surgical candidate. He is previously received stereotactic body radiotherapy to this site. At this point, the options include systemic chemotherapy versus reirradiation versus a combination of these. Complexity his case, and less than be presented at our multidisciplinary thoracic oncology conference earlier this week to help develop a consensus. I will call the patient's son at [phone] after the conference to coordinate the next step.  _____________________________________  Artist Pais. Kathrynn Running, M.D.

## 2012-04-15 NOTE — Telephone Encounter (Signed)
Caller: Mark/Child; Patient Name: Roy [name]; PCP: Oliver Barre; Best Callback Phone Number: [phone].  Called to ask about nurse assistance with dressing on hand wound. Reports difficulty managing dressing on back of L hand due to thin skin.  Reports intermittent dizzy episodes; became dizzy and fell backward in home bathroom  home 04/13/12; evaluated in ED.  L hand scrape was cleaned and dressed. Pelvic xray negative.  Also reports episode 04/14/12 where stopped talking;  sharp head pain like a throbbing that passed followed by sensation that he could not move.  Positive lung biopsy 04/11/12.  Advised to see ED now due to no appointments remain in office for recent episode of paralysis, now resolved per Neurological Deficits Guideline.

## 2012-04-15 NOTE — Progress Notes (Signed)
Patient here to review right chest needle biopsy completed on 04/11/12 positive for invasive squamous cell carcinoma. Patient went  to ED on 04/13/12 after dizziness caused him to fall.Has some bruises but no fractures.

## 2012-04-18 ENCOUNTER — Ambulatory Visit (INDEPENDENT_AMBULATORY_CARE_PROVIDER_SITE_OTHER): Payer: Medicare Other | Admitting: Internal Medicine

## 2012-04-18 ENCOUNTER — Ambulatory Visit: Payer: Medicare Other | Admitting: Radiation Oncology

## 2012-04-18 ENCOUNTER — Encounter: Payer: Self-pay | Admitting: Internal Medicine

## 2012-04-18 ENCOUNTER — Ambulatory Visit (INDEPENDENT_AMBULATORY_CARE_PROVIDER_SITE_OTHER)
Admission: RE | Admit: 2012-04-18 | Discharge: 2012-04-18 | Disposition: A | Discharge status: Home / Self Care | Payer: Medicare Other | Source: Ambulatory Visit | Attending: Internal Medicine | Admitting: Internal Medicine

## 2012-04-18 VITALS — BP 130/64 | HR 86 | Temp 97.2°F | Wt 143.5 lb

## 2012-04-18 DIAGNOSIS — M25432 Effusion, left wrist: Secondary | ICD-10-CM

## 2012-04-18 DIAGNOSIS — R0609 Other forms of dyspnea: Secondary | ICD-10-CM

## 2012-04-18 DIAGNOSIS — T148XXA Other injury of unspecified body region, initial encounter: Secondary | ICD-10-CM

## 2012-04-18 DIAGNOSIS — Z9181 History of falling: Secondary | ICD-10-CM | POA: Insufficient documentation

## 2012-04-18 DIAGNOSIS — R0600 Dyspnea, unspecified: Secondary | ICD-10-CM

## 2012-04-18 DIAGNOSIS — G250 Essential tremor: Secondary | ICD-10-CM

## 2012-04-18 DIAGNOSIS — G252 Other specified forms of tremor: Secondary | ICD-10-CM

## 2012-04-18 DIAGNOSIS — M25439 Effusion, unspecified wrist: Secondary | ICD-10-CM

## 2012-04-18 DIAGNOSIS — N35919 Unspecified urethral stricture, male, unspecified site: Secondary | ICD-10-CM

## 2012-04-18 DIAGNOSIS — IMO0002 Reserved for concepts with insufficient information to code with codable children: Secondary | ICD-10-CM

## 2012-04-18 DIAGNOSIS — E119 Type 2 diabetes mellitus without complications: Secondary | ICD-10-CM

## 2012-04-18 HISTORY — DX: Unspecified urethral stricture, male, unspecified site: N35.919

## 2012-04-18 NOTE — Assessment & Plan Note (Addendum)
Pt reqeusts f/u with Dr Renold Genta, will refer

## 2012-04-18 NOTE — Patient Instructions (Addendum)
The left hand was cleaned a bit today Please continue the Tegaderm until it is "falling off" to the skin tear Please go to XRAY in the Basement for the x-ray test - the left wrist xray You will be contacted by phone if any changes need to be made immediately.  Otherwise, you will receive a letter about your results with an explanation. Continue all other medications as before Please have the pharmacy call with any refills you may need. You will be contacted regarding the referral for: Dr Delton Coombes (pulmonary) and Dr Storm Frisk (neurology)

## 2012-04-18 NOTE — Assessment & Plan Note (Signed)
With recent fall with what sounds like retropulsion with walker - to refer Dr Nat/neurology - r/o parkinsons

## 2012-04-18 NOTE — Progress Notes (Signed)
Subjective:    Patient ID: Roy [name], male    DOB: 01/17/1921, 78 y.o.   MRN: [id number]  HPI  Here to f/u with family/daughter;  Pt with recent unusual fall with what sounds like retropulsion, walks with walker, but also with worsening persistent severe tremor, gait difficulty.  C/o dyspnea today which is a surprise to the daughter, and he asks for referral to pulm.  Also with recent new lung cancer, different path from previous, and course of tx being determined in consultation among his caregivers at this time.  Pt denies chest pain, wheezing, orthopnea, PND, increased LE swelling, palpitations, or syncope.  Pt denies new neurological symptoms such as new headache, or facial or extremity weakness or numbness.   Pt denies polydipsia, polyuria.  Has swelling and pain to left wrist and post hand assoc with the fall and rather large skin tear, unable to stitch in ER due to skin thin, tx with tegaderm which has worked well, has had some minor slight oozing serosanginous outside of the tegaderm, but no increased red, tender, or purulent drainage.  Daughter asks for film due to ongoing pain to wrist. Past Medical History  Diagnosis Date  . Anxiety   . Asthma   . Depression   . Kidney stones     hx of   . Prostate cancer 2005    gleason 4+4=8  . Pulmonary fibrosis   . Lung cancer 2010    RLL  . Hx of radiation therapy may 2010 thru june 2010    5 tx, stereotactic radiotherapy  . Tremor, essential   . COPD (chronic obstructive pulmonary disease)   . DM (diabetes mellitus)   . Urethral stricture   . Normocytic anemia   . Diverticulosis   . Allergic rhinitis   . Hyperlipidemia   . Adenomatous colon polyp   . Chronic left shoulder pain   . Chronic constipation   . Hemorrhoids   . Cellulitis of leg, right   . Edema   . Radiation May/June 2010    stereotactic body radiotherapy  . Urethral stricture 04/18/2012    Dr mcdermot/urology   Past Surgical History  Procedure Date  .  Appendectomy   . Hdr brachytherapy implant for prostate cancer 2005  . Cataract extraction, bilateral   . Internal urethrotomy 12/09/2008  . Colonoscopy October 2008    Diverticulosis, hemorrhoids  . Tonsillectomy 1943  . Ct biopsy 04/11/2012    invasive squamous cell carcinoma    reports that he quit smoking about 22 years ago. He has never used smokeless tobacco. He reports that he does not drink alcohol or use illicit drugs. family history includes Cancer in his father; Colon cancer in his sister; Heart disease in his brother; Lung cancer in his brother; and Prostate cancer in his brother. Allergies  Allergen Reactions  . Iodine Nausea Only  . Sulfonamide Derivatives Nausea And Vomiting   Current Outpatient Prescriptions on File Prior to Visit  Medication Sig Dispense Refill  . AMBULATORY NON FORMULARY MEDICATION Oxygen At bedtime as directed         . aspirin EC 81 MG tablet Take 81 mg by mouth every morning.       . beclomethasone (QVAR) 80 MCG/ACT inhaler Inhale 1 puff into the lungs 2 (two) times daily.      . Cholecalciferol (VITAMIN D3 SUPER STRENGTH) 2000 UNITS TABS Take 1 tablet by mouth daily.      . clonazePAM (KLONOPIN) 2 MG tablet Take 2  mg by mouth 2 (two) times daily.       . fish oil-omega-3 fatty acids 1000 MG capsule Take 1 g by mouth daily.       . montelukast (SINGULAIR) 10 MG tablet Take 10 mg by mouth at bedtime.      . Probiotic Product (ALIGN) 4 MG CAPS Take 1 capsule by mouth daily.      Marland Kitchen tiotropium (SPIRIVA) 18 MCG inhalation capsule Place 18 mcg into inhaler and inhale daily.        Marland Kitchen triamcinolone (NASACORT) 55 MCG/ACT nasal inhaler Place 2 sprays into the nose as needed. For allergies      . DISCONTD: DICYCLOMINE HCL PO Take 1 tablet by mouth daily.        Review of Systems Review of Systems  Constitutional: Negative for diaphoresis and unexpected weight change.  HENT: Negative for drooling and tinnitus.   Eyes: Negative for photophobia and visual  disturbance.  Respiratory: Negative for choking and stridor.   Gastrointestinal: Negative for vomiting and blood in stool.  Genitourinary: Negative for hematuria and decreased urine volume.  Musculoskeletal: Negative for gait problem.  Skin: Negative for color change and wound.  Neurological: Negative for tremors and numbness.  Psychiatric/Behavioral: Negative for decreased concentration. The patient is not hyperactive.       Objective:   Physical Exam BP 130/64  Pulse 86  Temp 97.2 F (36.2 C) (Oral)  Wt 143 lb 8 oz (65.091 kg)  SpO2 90% Physical Exam  VS noted Constitutional: Pt appears well-developed and well-nourished.  HENT: Head: Normocephalic.  Right Ear: External ear normal.  Left Ear: External ear normal.  Eyes: Conjunctivae and EOM are normal. Pupils are equal, round, and reactive to light.  Neck: Normal range of motion. Neck supple.  Cardiovascular: Normal rate and regular rhythm.   Pulmonary/Chest: Effort normal and breath sounds decreased, no rales or wheezing Abd:  Soft, NT, non-distended, + BS Neurological: Pt is alert. Walks with walker, severe tremor head and voice Skin: Skin is warm. No erythema. Left post hand skin tear wihtout significant drainage, o/w intact, no cellultiis but left post wrist with 1+ swelling, tender Psychiatric: Pt behavior is normal. Thought content normal.     Assessment & Plan:

## 2012-04-20 ENCOUNTER — Encounter: Payer: Self-pay | Admitting: Internal Medicine

## 2012-04-20 NOTE — Assessment & Plan Note (Signed)
To cont walker use, consider PT, delcines at this time

## 2012-04-20 NOTE — Assessment & Plan Note (Signed)
stable overall by hx and exam, most recent data reviewed with pt, and pt to continue medical treatment as before 

## 2012-04-20 NOTE — Assessment & Plan Note (Signed)
I think less likely to have fx, but will check film per daughter reqeust

## 2012-04-22 ENCOUNTER — Encounter: Payer: Self-pay | Admitting: Radiation Oncology

## 2012-04-22 NOTE — Progress Notes (Signed)
  Radiation Oncology         [phone] ________________________________  Name: Roy [name]  MRN: [id number]  Date: 04/22/2012  DOB: Apr 19, 1921  Chart Note:  We reviewed this patient's films and biopsy results in the radiation oncology physician conference as well as the multidisciplinary thoracic oncology conference. His treatment options for locally recurrent right lower lung cancer are limited. He's not ideal surgical candidate or chemotherapy candidate. Reirradiation does certainly carry some risk for injury but this was felt to you. The lowest potential side effect or risk profile and the consensus agreement amongst the physicians above was to proceed potentially with a short course of palliative radiation to perhaps 30 gray in 10 fractions to the right lower lung mass now. The other option would be to delay radiation for the development of progressive symptoms. I shared the consensus recommendation with the patient's son by telephone this afternoon and they will call back once they decide.   ________________________________  Artist Pais. Kathrynn Running, M.D.

## 2012-04-23 ENCOUNTER — Telehealth: Payer: Self-pay

## 2012-04-23 NOTE — Telephone Encounter (Signed)
Message copied by Jayme Cloud on Tue Apr 23, 2012  2:48 PM ------      Message from: [place], [place]      Created: Tue Apr 23, 2012  2:26 PM      Regarding: RE: patient ready for treatment       Please schedule CT sim chest for [facility] on next available Friday slot.            MM            ----- Message -----         From: Tessa Lerner, RN         Sent: 04/23/2012  10:55 AM           To: Oneita Hurt, MD      Subject: patient ready for treatment                              Spoke with patient's daughter  Roy [name] is ready to start treatment. I will notify with appointment once orders completed for ct simulation. Thanks [name]

## 2012-04-26 ENCOUNTER — Ambulatory Visit
Admission: RE | Admit: 2012-04-26 | Discharge: 2012-04-26 | Disposition: A | Discharge status: Home / Self Care | Payer: Medicare Other | Source: Ambulatory Visit | Attending: Radiation Oncology | Admitting: Radiation Oncology

## 2012-04-26 ENCOUNTER — Encounter: Payer: Self-pay | Admitting: Radiation Oncology

## 2012-04-26 DIAGNOSIS — C3490 Malignant neoplasm of unspecified part of unspecified bronchus or lung: Secondary | ICD-10-CM

## 2012-04-26 NOTE — Progress Notes (Signed)
Met with patient to discuss RO billing.  Dx: 162.9 Lung, NOS  Attending Rad: Dr. Kathrynn Running  Rad Tx: IMRT [id number]

## 2012-04-27 ENCOUNTER — Encounter: Payer: Self-pay | Admitting: Radiation Oncology

## 2012-04-27 NOTE — Progress Notes (Signed)
  Radiation Oncology         (336) [phone] ________________________________  Name: Roy [name] MRN: [id number]  Date: 04/26/2012  DOB: 12/27/1920  SIMULATION AND TREATMENT PLANNING NOTE  DIAGNOSIS:  78 year old gentleman with recurrent localized non-small cell carcinoma right lower lung  NARRATIVE:  The patient carefully considered his treatment options at this time. These included continued surveillance with possible palliative radiation in the future in the event of pulmonary symptoms or elective radiotherapy now to help delay tumor progression of the right lower lung. He understands that reirradiation of right lung carries increased risk for radiation toxicity to the chest wall pulmonary vessels airways and esophagus in addition to the spinal cord. He is willing to accept these risks assuming we proceed with a low dose of palliative radiation.  The patient was brought to the CT Simulation planning suite.  Identity was confirmed.  All relevant records and images related to the planned course of therapy were reviewed.  The patient freely provided informed written consent to proceed with treatment after reviewing the details related to the planned course of therapy. The consent form was witnessed and verified by the simulation staff.  Then, the patient was set-up in a stable reproducible  supine position for radiation therapy.  CT images were obtained.  Surface markings were placed.  The CT images were loaded into the planning software.  Then the target and avoidance structures were contoured.  Treatment planning then occurred.  The radiation prescription was entered and confirmed.  A total of 1 complex treatment device was fabricated in the form of an alpha cradle immobilization device for body positioning. I have requested : Intensity Modulated Radiotherapy (IMRT) is medically necessary for this case for the following reason:  Previous treatment to this area..  I have ordered:Nutrition Consult  PLAN:   The patient will receive 30 Gy in 10 fractions  For palliation.  ________________________________  Artist Pais Kathrynn Running, M.D.

## 2012-05-02 ENCOUNTER — Ambulatory Visit: Payer: Medicare Other | Admitting: Neurology

## 2012-05-06 ENCOUNTER — Ambulatory Visit (INDEPENDENT_AMBULATORY_CARE_PROVIDER_SITE_OTHER): Payer: Medicare Other | Admitting: Internal Medicine

## 2012-05-06 ENCOUNTER — Encounter: Payer: Self-pay | Admitting: Internal Medicine

## 2012-05-06 VITALS — BP 96/54 | HR 88 | Ht 65.5 in | Wt 143.0 lb

## 2012-05-06 DIAGNOSIS — K225 Diverticulum of esophagus, acquired: Secondary | ICD-10-CM

## 2012-05-06 NOTE — Patient Instructions (Addendum)
You have a Zenker's diverticulum - causing the problem with regurgitation of food in the mornings.  Zenker's diverticulum is a pouch that forms at the back of the throat at the junction of the pharynx and the esophagus (the food passage to the stomach). The pouch causes problems with swallowing. The growth is most common in people over the age of 25. However, physicians at [facility] have seen patients in their 30s who have Zenker's diverticulum. Causes The cause of Zenker's diverticulum is unknown. The formation of the pouch does not appear to be hereditary. In many patients who have a Zenker's diverticulum, other problems of the esophagus are present -- however, this is not always the case. Symptoms The most common symptom of Zenker's diverticulum is difficulty swallowing food. Food may actually come back up out of the mouth several minutes to hours after eating. Patients also suffer from choking, feeling mucus collecting in the throat, hoarseness, or bad breath. The symptoms can be quite embarrassing and debilitating. Diagnosis In patients with symptoms suggestive of Zenker's diverticulum, a very simple and safe x-ray called a barium swallow is performed.   Please keep the head of bed elevated.  Thank you for choosing me and [facility].  Iva Boop, MD, Clementeen Graham

## 2012-05-08 ENCOUNTER — Ambulatory Visit
Admission: RE | Admit: 2012-05-08 | Discharge: 2012-05-08 | Disposition: A | Discharge status: Home / Self Care | Payer: Medicare Other | Source: Ambulatory Visit | Attending: Radiation Oncology | Admitting: Radiation Oncology

## 2012-05-08 VITALS — Wt 144.4 lb

## 2012-05-08 DIAGNOSIS — C3490 Malignant neoplasm of unspecified part of unspecified bronchus or lung: Secondary | ICD-10-CM

## 2012-05-08 DIAGNOSIS — K225 Diverticulum of esophagus, acquired: Secondary | ICD-10-CM | POA: Insufficient documentation

## 2012-05-08 NOTE — Progress Notes (Signed)
Patient ID: Roy [name], male   DOB: [birth year], 78 y.o.   MRN: [id number]   Cc: regurgitation  HPI:  The patient presents with complaints of finding small food particles in his nose in the mornings, not always but frequently. He denies overt dysphagia. He has a known Zenker's diverticulum.  His constipation is better.  He is to start XRT for progression of lung cancer.  He has been told he is not a candidate for any surgery, by Dr. Delton Coombes. "I would never come off the breatjing machine".  Medications, allergies, past medical history, past surgical history, family history and social history are reviewed and updated in the EMR.  PE: Elderly wm with diffuse body tremor NAD   Ass/Plan:  1. Zenker's diverticulum     He has a mildly symptomatic Zenker's diverticulum. We spent 15+ minutes talking about this and I showed him the images. He is not a candidate for any intervention given co-morbidities. He will keep head of bed elevated. See me as needed.  WU:[id number] Delton Coombes, MD, Margaretmary Dys, MD

## 2012-05-08 NOTE — Progress Notes (Signed)
Post sim ed completed w/pt who received radiation therapy i to chest in 2010.  Reviewed all pertinent information from "[facility] and You" booklet; pt states he has booklet. Pt verbalized no questions.

## 2012-05-09 ENCOUNTER — Ambulatory Visit
Admission: RE | Admit: 2012-05-09 | Discharge: 2012-05-09 | Disposition: A | Discharge status: Home / Self Care | Payer: Medicare Other | Source: Ambulatory Visit | Attending: Radiation Oncology | Admitting: Radiation Oncology

## 2012-05-10 ENCOUNTER — Ambulatory Visit: Payer: Medicare Other | Admitting: Emergency Medicine

## 2012-05-10 ENCOUNTER — Ambulatory Visit
Admission: RE | Admit: 2012-05-10 | Discharge: 2012-05-10 | Disposition: A | Discharge status: Home / Self Care | Payer: Medicare Other | Source: Ambulatory Visit | Attending: Radiation Oncology | Admitting: Radiation Oncology

## 2012-05-10 ENCOUNTER — Encounter: Payer: Self-pay | Admitting: Radiation Oncology

## 2012-05-10 VITALS — BP 104/59 | HR 101 | Temp 97.3°F | Resp 20

## 2012-05-10 DIAGNOSIS — C3490 Malignant neoplasm of unspecified part of unspecified bronchus or lung: Secondary | ICD-10-CM

## 2012-05-10 NOTE — Progress Notes (Signed)
  Radiation Oncology         [phone] ________________________________  Name: Roy [name] MRN: [id number]  Date: 05/10/2012  DOB: 1920-12-06  Weekly Radiation Therapy Management  Current Dose: 9 Gy     Planned Dose:  30 Gy  Narrative . . . . . . . . The patient presents for routine under treatment Pt states he "was a little bit hoarse the first 2 days of tx but it went away". Pt also states he feels "a little short of breath today". O2 sat 93% today. He uses O2 nightly, has sob w/exertion. Reports prod cough w/scant light yellow phlegm while eating and after. Denies pain and fatigue, but has loss of appetite                                     The patient is without complaint.                                 Set-up films were reviewed.                                 The chart was checked. Physical Findings. . . Weight essentially stable.  No significant changes. Impression . . . . . . . The patient is  tolerating radiation. Plan . . . . . . . . . . . . Continue treatment as planned.  ________________________________  Artist Pais. Kathrynn Running, M.D.

## 2012-05-10 NOTE — Progress Notes (Signed)
Pt states he "was a little bit hoarse the first 2 days of tx but it went away". Pt also states he feels "a little short of breath today". O2 sat 93% today. He uses O2 nightly, has sob w/exertion. Reports prod cough w/scant light yellow phlegm while eating and after. Denies pain and fatigue, but has loss of appetite

## 2012-05-13 ENCOUNTER — Ambulatory Visit
Admission: RE | Admit: 2012-05-13 | Discharge: 2012-05-13 | Disposition: A | Discharge status: Home / Self Care | Payer: Medicare Other | Source: Ambulatory Visit | Attending: Radiation Oncology | Admitting: Radiation Oncology

## 2012-05-14 ENCOUNTER — Ambulatory Visit
Admission: RE | Admit: 2012-05-14 | Discharge: 2012-05-14 | Disposition: A | Discharge status: Home / Self Care | Payer: Medicare Other | Source: Ambulatory Visit | Attending: Radiation Oncology | Admitting: Radiation Oncology

## 2012-05-15 ENCOUNTER — Ambulatory Visit
Admission: RE | Admit: 2012-05-15 | Discharge: 2012-05-15 | Disposition: A | Discharge status: Home / Self Care | Payer: Medicare Other | Source: Ambulatory Visit | Attending: Radiation Oncology | Admitting: Radiation Oncology

## 2012-05-16 ENCOUNTER — Ambulatory Visit
Admission: RE | Admit: 2012-05-16 | Discharge: 2012-05-16 | Disposition: A | Discharge status: Home / Self Care | Payer: Medicare Other | Source: Ambulatory Visit | Attending: Radiation Oncology | Admitting: Radiation Oncology

## 2012-05-17 ENCOUNTER — Ambulatory Visit
Admission: RE | Admit: 2012-05-17 | Discharge: 2012-05-17 | Disposition: A | Discharge status: Home / Self Care | Payer: Medicare Other | Source: Ambulatory Visit | Attending: Radiation Oncology | Admitting: Radiation Oncology

## 2012-05-17 ENCOUNTER — Encounter: Payer: Self-pay | Admitting: Radiation Oncology

## 2012-05-17 VITALS — BP 92/59 | HR 99 | Temp 97.4°F | Resp 20 | Wt 143.8 lb

## 2012-05-17 DIAGNOSIS — C3490 Malignant neoplasm of unspecified part of unspecified bronchus or lung: Secondary | ICD-10-CM

## 2012-05-17 NOTE — Progress Notes (Signed)
Pt reports occasional dry cough, very fatigued, sob w/minimal activity, loss of appetite. Pt states he has seen nutritionist in past, "knows what he needs to do but just doesn't always do it". Pt states he eats nutrition bars, drinks Ensure. Discussed importance of eating adequate calories, protein to combat his fatigue.  Pt completes next Tues; gave FU appt.

## 2012-05-17 NOTE — Progress Notes (Signed)
  Radiation Oncology         [phone]) [phone] ________________________________  Name: Roy [name] MRN: [id number]  Date: 05/17/2012  DOB: 05-12-1921  Weekly Radiation Therapy Management  Current Dose: 24 Gy     Planned Dose:  30 Gy  Narrative . . . . . . . . The patient presents for routine under treatment assessment.                                                     The patient is noting fatigue                                 Set-up films were reviewed.                                 The chart was checked. Physical Findings. . . Weight essentially stable.  No significant changes. Impression . . . . . . . The patient is  tolerating radiation. Plan . . . . . . . . . . . . Continue treatment as planned.  ________________________________  Artist Pais. Kathrynn Running, M.D.

## 2012-05-20 ENCOUNTER — Ambulatory Visit
Admission: RE | Admit: 2012-05-20 | Discharge: 2012-05-20 | Disposition: A | Discharge status: Home / Self Care | Payer: Medicare Other | Source: Ambulatory Visit | Attending: Radiation Oncology | Admitting: Radiation Oncology

## 2012-05-21 ENCOUNTER — Encounter: Payer: Self-pay | Admitting: Radiation Oncology

## 2012-05-21 ENCOUNTER — Ambulatory Visit
Admission: RE | Admit: 2012-05-21 | Discharge: 2012-05-21 | Disposition: A | Discharge status: Home / Self Care | Payer: Medicare Other | Source: Ambulatory Visit | Attending: Radiation Oncology | Admitting: Radiation Oncology

## 2012-05-21 VITALS — BP 118/73 | HR 80 | Temp 96.0°F | Resp 20 | Wt 144.1 lb

## 2012-05-21 DIAGNOSIS — C3490 Malignant neoplasm of unspecified part of unspecified bronchus or lung: Secondary | ICD-10-CM

## 2012-05-21 NOTE — Progress Notes (Addendum)
Pt completed tx today to right lung. He continues to have fatigue, loss of appetite. Repeated earlier discussion of nutrition needs, advised Ensure, 2 cans/daily. He states he "is going to buy some more today". Pt states he noticed last night "when he moves his left shoulder it feels like a muscle is pulled". He states there is no pain when he is still; he has not taken any meds for this. Pt has occasional dry cough, sob w/minimal exertion. Pt only uses O2 qhs. Discussed his O2 sat today which fluctuated between 89-90 % on room air. Strongly advised pt to take his portable O2 w/him whenever out of home. Pt drives and advised him low O2 could potentially cause dizziness, fainting, be dangerous for pt. Pt verbalized understanding. Pt has FU appt scheduled.

## 2012-05-23 NOTE — Progress Notes (Signed)
  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  Narrative: The patient tolerated radiation treatment relatively well.  He experienced fatigue and loss of appetite. He supplemented his diet with Ensure, 2 cans/daily.  He described some pain when he moves his left shoulder which feels like a muscle is pulled  He also described an occasional dry cough and dyspena with minimal exertion. Patient only used O2 qhs and we strongly advised pt to take his portable O2 with him whenever out of home because O2 sat fluctuated between 89-90 % on room air in the clinic  Plan: The patient has completed radiation treatment. The patient will return to radiation oncology clinic for routine followup in one month. I advised them to call or return sooner if they have any questions or concerns related to their recovery or treatment. ________________________________  Artist Pais. Kathrynn Running, M.D.

## 2012-05-27 ENCOUNTER — Telehealth: Payer: Self-pay | Admitting: Emergency Medicine

## 2012-05-27 NOTE — Telephone Encounter (Signed)
[email or web address] - Dr. Sherene Sires has opening tomorrow.

## 2012-05-28 ENCOUNTER — Encounter: Payer: Self-pay | Admitting: Emergency Medicine

## 2012-05-28 ENCOUNTER — Ambulatory Visit (INDEPENDENT_AMBULATORY_CARE_PROVIDER_SITE_OTHER): Payer: Medicare Other | Admitting: Emergency Medicine

## 2012-05-28 VITALS — BP 98/60 | HR 98 | Temp 97.8°F | Ht 64.0 in | Wt 145.2 lb

## 2012-05-28 DIAGNOSIS — C3490 Malignant neoplasm of unspecified part of unspecified bronchus or lung: Secondary | ICD-10-CM

## 2012-05-28 DIAGNOSIS — R0989 Other specified symptoms and signs involving the circulatory and respiratory systems: Secondary | ICD-10-CM

## 2012-05-28 DIAGNOSIS — R0600 Dyspnea, unspecified: Secondary | ICD-10-CM

## 2012-05-28 DIAGNOSIS — J8410 Pulmonary fibrosis, unspecified: Secondary | ICD-10-CM

## 2012-05-28 NOTE — Assessment & Plan Note (Signed)
Squamous cell lung CA, new primary. Undergoing XRT.

## 2012-05-28 NOTE — Assessment & Plan Note (Signed)
There may be some mild progression of his ILD by CT scan. The most striking change was his new R hilar mass that certainly may now be worsening his SOB. He was hypoxemic on arrival today, will qualify for o2 with exertion. Will arrange for this through [facility] and then see him back to discuss improvement

## 2012-05-28 NOTE — Telephone Encounter (Signed)
Set appt for pt w/ RB today @ 3:15 pm.  Pt verbalized understanding & stated nothing further needed at this time.  Roy [name]

## 2012-05-28 NOTE — Patient Instructions (Addendum)
We will qualify you for and arrange oxygen for you to use with exertion and walking.  Continue to wear the oxygen at night  Follow with Dr Delton Coombes in 6 weeks to discuss your breathing.

## 2012-05-28 NOTE — Progress Notes (Signed)
History of Present Illness:  78 yo former smoker with hx COPD, prostatic CA s/p resection 2005, ureteral strictures. Evaluated in ED 3/10 for abd pain, suspected from full bladder, some tremor and dysarthria. Underwent MRI abdomen that revealed RLL mass. Subsequent PET/CT also shows subpleural ILD. Has albuterol, rarely requires. uses Spiriva daily.   12/31/08 -- Pt returns to [facility] today s/p needle bx of his RUL mass. The bx shows non-small cell lung CA, with features suggestive of adenoCA. Has completed swallowing eval for his ILD w/u.   ROV 10/27/09 -- Returns today to tell me that he is having more trouble swalowing. Food sems to go down OK, but he has consistent coughing with thin liquids. His speech Rx eval in May 10 recommended thin liquids Completed radiation in June 10. Followed by Dr Kathrynn Running with serial scans, next one to be in May 11. Wt stable. Some stable exertional SOB. No exacerbations of COPD in last year.   ROV 11/30/09 -- follows up to discuss swallow eval results. Tells me today he has been more fatigued, much more tired since last visit. His diet has "gone to hell" since his swallowing eval showed that he needs nectar thick liquids, has silent aspiration of large bolused solids.   ROV 02/16/10 -- returns for f/u of his ILD from aspiration, COPD, adenoCA s/p XRT. Since last visit has had CT scan in 5/11 that showed some scarring in the region of his lung mass. Last time we changed back to his usual diet because the modified diet was intolerable. His wt is up some 5 lbs. Still trying to use thickener in his drinks.   ROV 06/29/10 -- hx chronic aspiration and ILD, adenoCA s/p XRT. Since last visit he has several falls, still lives alone. Tells me that he is feeling OK, but the thickened diet is making him constipated. Miralax was started about 4 days ago. Was just in [place] for rehab after discharged for a fall. No aspiration symptoms, no cough with taking by mouth as long as he thinckens. Spiriva  was substituted with atrovent briefly, now back on Spiriva - missed it.   ROV 05/28/12 -- hx chronic aspiration and ILD, adenoCA s/p XRT. Returns for eval progressive dyspnea. 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 Comparison of PET 7/22 to  CT scan from 01/2011  And 07/2011 show stable ILD changes. He tells me that minimal exertion is now limiting him - SOB after 50 - 60 feet walking. He can get through the market without stopping    Filed Vitals:   05/28/12 1536  BP: 98/60  Pulse: 98  Temp: 97.8 F (36.6 C)   Gen: Pleasant, well-nourished, in no distress,  normal affect  ENT: No lesions,  mouth clear,  oropharynx clear, no postnasal drip  Neck: No JVD, no TMG, no carotid bruits  Lungs: No use of accessory muscles, bilateral insp crackles all fields  Cardiovascular: RRR, heart sounds normal, no murmur or gallops, no peripheral edema  Musculoskeletal: No deformities, no cyanosis, significant + clubbing  Neuro: alert, significant tremor, good strength.   Skin: Warm, no lesions or rashes  Dyspnea There may be some mild progression of his ILD by CT scan. The most striking change was his new R hilar mass that certainly may now be worsening his SOB. He was hypoxemic on arrival today, will qualify for o2 with exertion. Will arrange for this through [facility] and then see  him back to discuss improvement  INTERSTITIAL LUNG DISEASE Appears to be largely stable by serial CT scans back to 01/2011.   NEOPLASM, MALIGNANT, LUNG, NONSMALL CELL Squamous cell lung CA, new primary. Undergoing XRT.

## 2012-05-28 NOTE — Assessment & Plan Note (Signed)
Appears to be largely stable by serial CT scans back to 01/2011.

## 2012-05-31 ENCOUNTER — Ambulatory Visit: Payer: Medicare Other | Admitting: Internal Medicine

## 2012-06-02 NOTE — Progress Notes (Signed)
  Radiation Oncology         [phone] ________________________________  Name: Roy [name] MRN: [id number]  Date: 04/26/2012  DOB: [birth year]  SPECIAL TREATMENT PROCEDURE NOTE  NARRATIVE:  The planned course of therapy using radiation constitutes a special treatment procedure. Special care is required in the management of this patient for the following reasons.  I have requested : This treatment constitutes a Special Treatment Procedure for the following reason: [ Retreatment in a previously radiated area requiring careful monitoring of increased risk of toxicity due to overlap of previous treatment..   The special nature of the planned course of radiotherapy will require increased physician supervision and oversight to ensure patient's safety with optimal treatment outcomes. ________________________________  Artist Pais Kathrynn Running, M.D.

## 2012-06-03 ENCOUNTER — Telehealth: Payer: Self-pay | Admitting: Emergency Medicine

## 2012-06-03 NOTE — Telephone Encounter (Signed)
Spoke to pt he is aware he has portable 02 concentrator so he needs to use it when exerting himself Tobe Sos

## 2012-06-07 ENCOUNTER — Telehealth: Payer: Self-pay | Admitting: Emergency Medicine

## 2012-06-07 DIAGNOSIS — J449 Chronic obstructive pulmonary disease, unspecified: Secondary | ICD-10-CM

## 2012-06-07 DIAGNOSIS — J8410 Pulmonary fibrosis, unspecified: Secondary | ICD-10-CM

## 2012-06-07 NOTE — Telephone Encounter (Signed)
I spoke with Roy [name] and he stated pt needs a more portable system for when he uses his oxygen with exertion. He walks with a walker and there is no way he can lug a big cylinder tank around and push his walker. He asked we send an order to [facility] please. I have done so and will forward to RB as an [place]

## 2012-06-08 NOTE — Telephone Encounter (Signed)
Thank you :)

## 2012-06-11 ENCOUNTER — Encounter: Payer: Self-pay | Admitting: Neurology

## 2012-06-11 ENCOUNTER — Ambulatory Visit (INDEPENDENT_AMBULATORY_CARE_PROVIDER_SITE_OTHER): Payer: Medicare Other | Admitting: Neurology

## 2012-06-11 ENCOUNTER — Ambulatory Visit: Payer: Medicare Other | Admitting: Emergency Medicine

## 2012-06-11 VITALS — BP 108/60 | HR 94 | Temp 98.0°F | Resp 16 | Wt 147.0 lb

## 2012-06-11 DIAGNOSIS — G250 Essential tremor: Secondary | ICD-10-CM

## 2012-06-11 MED ORDER — PRIMIDONE 50 MG PO TABS: 50.0000 mg | ORAL_TABLET | Freq: Every day | ORAL | Status: DC

## 2012-06-11 NOTE — Patient Instructions (Addendum)
1.  Start primidone - 50 mg tablet - 1/2 tablet at night for a week and then increase to one tablet nightly 2.  Call me in 3-4 weeks and if not doing better with HAND tremor, we will consider increasing the dose over the phone 3.  I will want to see you in 3 months

## 2012-06-11 NOTE — Progress Notes (Signed)
Roy [name] was seen today in the movement disorders clinic for neurologic consultation at the request of Oliver Barre, MD.  The consultation is for the evaluation of tremor, retropulsion.  The first symptom(s) the patient noticed was tremor that started when he was 78 y/o.  He noted tremor in both hands first, but about 1 year later, it involved the voice.  Not long thereafter the head was also involved. It has gotten worse with time, primarily the voice.    He states that he is frustrated b/c people cannot understand him on the phone.   He reports that balance was okay until 2011.  He sustained a fall in 2011 and balance has been poor since.  PT helps, and he has been under 3 courses of PT.  He has not gone to PT in over a year, but goes to the gym 3 times per week.   He was recently told not to go to the gym b/c of poor lung function.  He has not fallen recently but has had multiple near falls.  He has not fallen in a year.  He uses OT devices in the shower (seat) to help him.  He cannot button clothing.    He cannot write because of tremor and this is one of the bothersome symptoms.  He is on klonopin now and has been on it for about 5 years and it helped initially but he is not sure it is helping now.  He takes 2mg  bid.  He has been on propranolol in the past but it did not help.  He has trouble getting in his hearing aids and doing things that require fine motor coordination.  In the past, alcohol made him "steady as a rock."  He has not noticed an effect with caffeine.  There is a strong family history of tremor on his mothers side (mother, uncle, aunt, cousin).  He has never had botox injections for the voice.  He has some urinary incontinence since a procedure for prostate CA.  When he had brachytherapy, one of the radiation seeds went through the urethra and scarred it and he had problems after that.    He has no trouble with hallucinations.  He has no diplopia.  He lives alone.  He cannot cook b/c  of the tremor.  He goes out to eat.    PREVIOUS MEDICATIONS:  Propranolol  ALLERGIES:   Allergies  Allergen Reactions  . Iodine Nausea Only  . Sulfonamide Derivatives Nausea And Vomiting    CURRENT MEDICATIONS:  Current Outpatient Prescriptions on File Prior to Visit  Medication Sig Dispense Refill  . AMBULATORY NON FORMULARY MEDICATION Oxygen At bedtime as directed         . aspirin EC 81 MG tablet Take 81 mg by mouth every morning.       Marland Kitchen b complex vitamins capsule Take 1 capsule by mouth daily.      . beclomethasone (QVAR) 80 MCG/ACT inhaler Inhale 1 puff into the lungs 2 (two) times daily.      . Cholecalciferol (VITAMIN D3 SUPER STRENGTH) 2000 UNITS TABS Take 1 tablet by mouth daily.      . fish oil-omega-3 fatty acids 1000 MG capsule Take by mouth daily.       . montelukast (SINGULAIR) 10 MG tablet Take 10 mg by mouth at bedtime.      . Probiotic Product (ALIGN) 4 MG CAPS Take 1 capsule by mouth daily.      Marland Kitchen  tiotropium (SPIRIVA) 18 MCG inhalation capsule Place 18 mcg into inhaler and inhale daily.        Marland Kitchen triamcinolone (NASACORT) 55 MCG/ACT nasal inhaler Place 2 sprays into the nose as needed. For allergies      . clonazePAM (KLONOPIN) 2 MG tablet Take 2 mg by mouth 2 (two) times daily.       . primidone (MYSOLINE) 50 MG tablet Take 1 tablet (50 mg total) by mouth at bedtime.  30 tablet  3  . DISCONTD: DICYCLOMINE HCL PO Take 1 tablet by mouth daily.         PAST MEDICAL HISTORY:   Past Medical History  Diagnosis Date  . Anxiety   . Asthma   . Depression   . Kidney stones     hx of   . Prostate cancer 2005    gleason 4+4=8  . Pulmonary fibrosis   . Lung cancer 2010    RLL  . Hx of radiation therapy may 2010 thru june 2010    5 tx, stereotactic radiotherapy  . Tremor, essential   . COPD (chronic obstructive pulmonary disease)   . DM (diabetes mellitus)   . Normocytic anemia   . Diverticulosis   . Allergic rhinitis   . Hyperlipidemia   . Adenomatous colon  polyp   . Chronic left shoulder pain   . Chronic constipation   . Hemorrhoids   . Cellulitis of leg, right   . Edema   . Radiation May/June 2010    stereotactic body radiotherapy  . Urethral stricture 04/18/2012    Dr mcdermot/urology  . GERD (gastroesophageal reflux disease)     PAST SURGICAL HISTORY:   Past Surgical History  Procedure Date  . Appendectomy   . Hdr brachytherapy implant for prostate cancer 2005  . Cataract extraction, bilateral   . Internal urethrotomy 12/09/2008  . Colonoscopy October 2008    Diverticulosis, hemorrhoids  . Tonsillectomy 1943  . Ct biopsy 04/11/2012    invasive squamous cell carcinoma    SOCIAL HISTORY:   History   Social History  . Marital Status: Widowed    Spouse Name: N/A    Number of Children: 1  . Years of Education: N/A   Occupational History  . CPA     Retired   . retired     Company secretary   Social History Main Topics  . Smoking status: Former Smoker -- 1.0 packs/day for 35 years    Quit date: 08/14/1989  . Smokeless tobacco: Never Used  . Alcohol Use: No     none in 10 years  . Drug Use: No  . Sexually Active: Not on file   Other Topics Concern  . Not on file   Social History Narrative   Daily caffeine     FAMILY HISTORY:   Family Status  Relation Status Death Age  . Mother Deceased     MVA  . Father Deceased     bladder CA  . Brother Deceased     lung CA  . Brother Deceased     CA  . Sister Deceased     CA  . Child Alive     daughter, anorexia    ROS:  A complete 10 system review of systems was obtained and was unremarkable apart from what is mentioned above.  PHYSICAL EXAMINATION:    VITALS:   Filed Vitals:   06/11/12 1337  BP: 108/60  Pulse: 94  Temp: 98 F (36.7 C)  Resp:  16  Weight: 147 lb (66.679 kg)    GEN:  The patient appears stated age and is in NAD. HEENT:  Normocephalic, atraumatic.  The mucous membranes are dry (eyes and mouth). The superficial temporal arteries are without  ropiness or tenderness. CV:  RRR Lungs:  CTAB Neck/HEME:  There are no carotid bruits bilaterally.  Neurological examination:  Orientation: The patient is alert and oriented x3. Fund of knowledge is appropriate.  Recent and remote memory are intact.  Attention and concentration are normal.    Able to name objects and repeat phrases. Cranial nerves: There is good facial symmetry. Pupils are equal round and reactive to light bilaterally. Fundoscopic exam reveals clear margins bilaterally. Extraocular muscles are intact. The visual fields are full to confrontational testing. The patient has tremor of the voice and significant spasmodic dysphonia. Soft palate rises symmetrically and there is no tongue deviation. Hearing is decreased to conversational tone. Sensation: Sensation is intact to light and pinprick throughout (facial, trunk, extremities). There is no extinction with double simultaneous stimulation. There is no sensory dermatomal level identified. Motor: Strength is 5/5 in the bilateral upper and lower extremities.  However, he cannot abduct the left shoulder greater than 90 degrees b/c of prior rotator cuff injury. Shoulder shrug is equal and symmetric.  There is no pronator drift. Deep tendon reflexes: Deep tendon reflexes are 0/4 at the bilateral biceps, triceps, brachioradialis, patella and achilles. Plantar responses are downgoing bilaterally.  Movement examination: Tone: There is normal tone in the bilateral upper extremities.  The tone in the lower extremities is normal.  Abnormal movements: There is tremor in the head in the "yes" direction.  There is tremor of the hands/arms but more so if the patients arms are held antigravity and proximal.  No asterixis.  He has trouble with archimedes spirals bilaterally. Coordination:  There is no decremation with RAM's. Gait and Station: The patient has  difficulty arising out of a deep-seated chair without the use of the hands. The patient's  stride length is normal but there is postural instability..      ASSESSMENT:  1.  Essential Tremor.  This is evidenced by the longstanding nature, strong fam hx, and improvement with alcohol.  I see no evidence of PD in this patient.  There is no rigidity or bradykinesia.  2. Postural instability.  About 30% of pts with significant ET will have balance difficulties as well.   PLAN:  1.  I talked to the patient about the fact that spasmodic dysphonia is very unresponsive to to medications.  The only effective treatment is Botox to the vocal cords.  There is a risk of dysphagia with this.  He does not wish to be referred to ENT for consideration for this right now. 2.  I agree with the clonazepam and at this point in time, I would not change that. 3.  We decided to try primidone.  I am going to start with a very low dosage.  Risks, benefits, side effects and alternative therapies were discussed.  The opportunity to ask questions was given and they were answered to the best of my ability.  The patient expressed understanding and willingness to follow the outlined treatment protocols. 4.  The patient is to call me in the next few weeks.  If he is tolerating the primidone, we will likely need to titrate this in an upward manner. 5.  The patient asked me multiple questions about DBS therapy.  He has done some research on  this.  Given his other medical problems, I am not sure that he is a candidate.  I also told him that it would not help him with the voice problem.  Nonetheless, the patient would like more information and would like to be considered for this.  I told him that the first step would really be asking his pulmonologist, cardiologist and oncologist whether he was a candidate.  While the [place] is done in the [place], my concern is that he would need to have a generator placed and could no longer have MRIs, which may be necessary given his cancer.  I will send a copy of my notes to his other  physicians.  I did give him some patient education and a CD on DBS for essential tremor. 6.  Return in about 3 months (around 09/11/2012). 7.  Time in room:  85 min.

## 2012-06-12 ENCOUNTER — Encounter: Payer: Self-pay | Admitting: Radiation Oncology

## 2012-06-13 ENCOUNTER — Telehealth: Payer: Self-pay | Admitting: *Deleted

## 2012-06-13 ENCOUNTER — Ambulatory Visit
Admission: RE | Admit: 2012-06-13 | Discharge: 2012-06-13 | Disposition: A | Discharge status: Home / Self Care | Payer: Medicare Other | Source: Ambulatory Visit | Attending: Radiation Oncology | Admitting: Radiation Oncology

## 2012-06-13 ENCOUNTER — Encounter: Payer: Self-pay | Admitting: Radiation Oncology

## 2012-06-13 VITALS — BP 123/60 | HR 99 | Temp 97.4°F | Resp 20 | Wt 145.3 lb

## 2012-06-13 DIAGNOSIS — C3490 Malignant neoplasm of unspecified part of unspecified bronchus or lung: Secondary | ICD-10-CM

## 2012-06-13 NOTE — Progress Notes (Signed)
  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  Interval Since Last Radiation:  1 months  Narrative:  The patient returns today for routine follow-up.  He now requires continuous O2.  He was recently started on a brief taper of prednisone for a dermatologic condition.  He has recovered from the acute effects of radiation.                          ALLERGIES:  is allergic to iodine and sulfonamide derivatives.  Meds: Current Outpatient Prescriptions  Medication Sig Dispense Refill  . AMBULATORY NON FORMULARY MEDICATION Oxygen At bedtime as directed         . aspirin EC 81 MG tablet Take 81 mg by mouth every morning.       Marland Kitchen b complex vitamins capsule Take 1 capsule by mouth daily.      . beclomethasone (QVAR) 80 MCG/ACT inhaler Inhale 1 puff into the lungs 2 (two) times daily.      . Cholecalciferol (VITAMIN D3 SUPER STRENGTH) 2000 UNITS TABS Take 1 tablet by mouth daily.      . clonazePAM (KLONOPIN) 2 MG tablet Take 2 mg by mouth 2 (two) times daily.       . fish oil-omega-3 fatty acids 1000 MG capsule Take by mouth daily.       . montelukast (SINGULAIR) 10 MG tablet Take 10 mg by mouth at bedtime.      . Probiotic Product (ALIGN) 4 MG CAPS Take 1 capsule by mouth daily.      Marland Kitchen tiotropium (SPIRIVA) 18 MCG inhalation capsule Place 18 mcg into inhaler and inhale daily.        Marland Kitchen triamcinolone (NASACORT) 55 MCG/ACT nasal inhaler Place 2 sprays into the nose as needed. For allergies      . primidone (MYSOLINE) 50 MG tablet Take 1 tablet (50 mg total) by mouth at bedtime.  30 tablet  3  . DISCONTD: DICYCLOMINE HCL PO Take 1 tablet by mouth daily.         Physical Findings: The patient is in no acute  distress. Patient is alert and oriented.  weight is 145 lb 4.8 oz (65.908 kg). His oral temperature is 97.4 F (36.3 C). His blood pressure is 123/60 and his pulse is 99. His respiration is 20 and oxygen saturation is 96%. . Lungs clear with some consolidation at tumor site, and diffuse crackles more concentrated at the bases  No significant changes.  Impression:  The patient is recovering from the effects of radiation.  Plan:  Today, I will order follow-up Chest CT to correlate with 6 weeks follow-up and see him a few days later.  The patient will remain at some risk for radiation pneumonitis because of his recent treatment over the next several weeks which may present with fever, dyspnea, and tachycardia associated with increased lung consolidation.  Whether his pulse of steroids will increase this risk is not clear, but, we will continue to follow his progress and discuss again in 2 weeks after chest CT.  _____________________________________  Artist Pais. Kathrynn Running, M.D.

## 2012-06-13 NOTE — Progress Notes (Signed)
Pt states he "has to get up once at night to clear thick sputum out of his throat". Sputum is typically white. Pt on continuous O2 @ 2L/min, but states he doesn't have good system set up at home now. Son in law states home health to call him today w/options because pt does not have strength to use heavy O2 tank at home. Pt still has lack of appetite, drinks 2 Ensures daily, eats "protein bars, 2-3 daily". Denies pain. Pt states he is on "Prednisone, decreasing doses per Dr Terri Piedra for his skin condition on his hands". He also has a cream to apply.

## 2012-06-13 NOTE — Telephone Encounter (Signed)
CALLED PATIENT TO INFORM OF TEST AND FU VISIT, SPOKE WITH PATIENT AND HE IS AWARE OF THESE APPTS 

## 2012-06-20 ENCOUNTER — Telehealth: Payer: Self-pay | Admitting: Internal Medicine

## 2012-06-20 NOTE — Telephone Encounter (Signed)
Left message for patient to call back I have tentatively scheduled an appt for him for 06/25/12

## 2012-06-21 ENCOUNTER — Telehealth: Payer: Self-pay | Admitting: Emergency Medicine

## 2012-06-21 DIAGNOSIS — C3490 Malignant neoplasm of unspecified part of unspecified bronchus or lung: Secondary | ICD-10-CM | POA: Insufficient documentation

## 2012-06-21 NOTE — Telephone Encounter (Signed)
I have left another message for the patient that I have scheduled an appt for 06/25/12 10:00 with Dr. Leone Payor.  I have asked that he call me back

## 2012-06-21 NOTE — Telephone Encounter (Signed)
Patient reports that he is having watery diarrhea after meals.  He is scheduled to see Dr Leone Payor on 06/25/12 at 9:00 and not 10:00

## 2012-06-21 NOTE — Telephone Encounter (Signed)
Called spoke with [name] - who reported that pt has been having some confusion/disorientation onset today w/ some increased weakness.  [name] reported he was with the patient earlier today and he seemed "pretty good."  [name] does not know if pt's O2 sats are decreased as they do not have an oximeter at home.  Advised pt needs to be taken to an UC to check his sats, urinalysis, whatever they deem appropriate.  [name] verbalized his understanding and stated he may call pt's PCP for further recs.    Will sign and forward RB as FYI.

## 2012-06-24 NOTE — Telephone Encounter (Signed)
Thank you :)

## 2012-06-25 ENCOUNTER — Ambulatory Visit (INDEPENDENT_AMBULATORY_CARE_PROVIDER_SITE_OTHER): Payer: Medicare Other | Admitting: Internal Medicine

## 2012-06-25 ENCOUNTER — Encounter: Payer: Self-pay | Admitting: Internal Medicine

## 2012-06-25 VITALS — BP 110/58 | HR 100 | Ht 64.0 in | Wt 144.1 lb

## 2012-06-25 DIAGNOSIS — K5900 Constipation, unspecified: Secondary | ICD-10-CM

## 2012-06-25 DIAGNOSIS — K5909 Other constipation: Secondary | ICD-10-CM

## 2012-06-25 DIAGNOSIS — R159 Full incontinence of feces: Secondary | ICD-10-CM

## 2012-06-25 NOTE — Progress Notes (Signed)
  Subjective:    Patient ID: Roy [name], male    DOB: Feb 19, 1921, 78 y.o.   MRN: [id number]  HPI Intermittent urgent defecation after meals (soon after) and also incontinence he is not aware of. Denies constipation. He is not using laxatives. No pain in abdomen. He had to get up 3 times the other night. Unusual, he says, not before and not since. Stools are loose, not runny but no real form.  Similar to previous problems.  Wt Readings from Last 3 Encounters:  06/25/12 144 lb 2 oz (65.375 kg)  06/13/12 145 lb 4.8 oz (65.908 kg)  06/11/12 147 lb (66.679 kg)   Medications, allergies, past medical history, past surgical history, family history and social history are reviewed and updated in the EMR.  Review of Systems Completed lung XRT in September. Denies fever    Objective:   Physical Exam General:  NAD, elderly, with significant essential tremor of head Eyes:   anicteric Abdomen:  soft and nontender, BS+ Rectal: Soft brown stool filling vault Ext:   no edema    Assessment & Plan:   1. Fecal incontinence   2. Chronic constipation    Working diagnosis is soft impaction given overall history. Will try bisacodyl tablet every 3 days to see if that improves this. If not - consider empiric antibiotic like metronidazole - ? Bacterial overgrowth. With his age and co-morbidities will try to avoid anti-spasmodics.

## 2012-06-25 NOTE — Patient Instructions (Addendum)
Dr. Leone Payor would like for you to try Dulcolax  5mg  Tablets.  This is over the counter.  Take one every 3 days to help with your bowel issues.  Thank you for choosing me and [facility].  Iva Boop, M.D., [facility]

## 2012-06-26 ENCOUNTER — Ambulatory Visit (HOSPITAL_COMMUNITY)
Admission: RE | Admit: 2012-06-26 | Discharge: 2012-06-26 | Disposition: A | Discharge status: Home / Self Care | Payer: Medicare Other | Source: Ambulatory Visit | Attending: Radiation Oncology | Admitting: Radiation Oncology

## 2012-06-26 ENCOUNTER — Encounter (HOSPITAL_COMMUNITY): Payer: Self-pay

## 2012-06-26 DIAGNOSIS — R599 Enlarged lymph nodes, unspecified: Secondary | ICD-10-CM | POA: Insufficient documentation

## 2012-06-26 DIAGNOSIS — K8020 Calculus of gallbladder without cholecystitis without obstruction: Secondary | ICD-10-CM | POA: Insufficient documentation

## 2012-06-26 DIAGNOSIS — I2510 Atherosclerotic heart disease of native coronary artery without angina pectoris: Secondary | ICD-10-CM | POA: Insufficient documentation

## 2012-06-26 DIAGNOSIS — C3490 Malignant neoplasm of unspecified part of unspecified bronchus or lung: Secondary | ICD-10-CM | POA: Insufficient documentation

## 2012-06-26 DIAGNOSIS — I700 Atherosclerosis of aorta: Secondary | ICD-10-CM | POA: Insufficient documentation

## 2012-06-26 DIAGNOSIS — J479 Bronchiectasis, uncomplicated: Secondary | ICD-10-CM | POA: Insufficient documentation

## 2012-06-26 DIAGNOSIS — J984 Other disorders of lung: Secondary | ICD-10-CM | POA: Insufficient documentation

## 2012-06-26 DIAGNOSIS — J438 Other emphysema: Secondary | ICD-10-CM | POA: Insufficient documentation

## 2012-06-26 DIAGNOSIS — Z923 Personal history of irradiation: Secondary | ICD-10-CM | POA: Insufficient documentation

## 2012-06-26 DIAGNOSIS — J8410 Pulmonary fibrosis, unspecified: Secondary | ICD-10-CM | POA: Insufficient documentation

## 2012-06-27 ENCOUNTER — Encounter: Payer: Self-pay | Admitting: Radiation Oncology

## 2012-06-27 ENCOUNTER — Ambulatory Visit
Admission: RE | Admit: 2012-06-27 | Discharge: 2012-06-27 | Disposition: A | Discharge status: Home / Self Care | Payer: Medicare Other | Source: Ambulatory Visit | Attending: Radiation Oncology | Admitting: Radiation Oncology

## 2012-06-27 VITALS — BP 107/53 | HR 91 | Temp 98.3°F | Resp 20 | Wt 142.6 lb

## 2012-06-27 DIAGNOSIS — C3490 Malignant neoplasm of unspecified part of unspecified bronchus or lung: Secondary | ICD-10-CM

## 2012-06-27 DIAGNOSIS — C61 Malignant neoplasm of prostate: Secondary | ICD-10-CM | POA: Insufficient documentation

## 2012-06-27 NOTE — Progress Notes (Signed)
Pt denies pain, cough, SOB. On continuous O2 @ 2L/min. Here to review ct chest scan results. Pt c/o "cellulitis" of right lower leg. Foot/ankle is dark red, swollen, painful. Pt has hx cellulitis of this extremity, has been on antibiotics in past. He has not contacted his PCP about this.

## 2012-06-27 NOTE — Progress Notes (Signed)
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   Interval Since Last Radiation: 1 months   Narrative: The patient returns today for routine follow-up. He now requires continuous O2. He was recently started on a brief taper of prednisone for a dermatologic condition. He has recovered from the acute effects of radiation.    ALLERGIES:  is allergic to iodine and sulfonamide derivatives.  Meds: Current Outpatient Prescriptions  Medication Sig Dispense Refill  . AMBULATORY NON FORMULARY MEDICATION Oxygen At bedtime as directed         . aspirin EC 81 MG tablet Take 81 mg by mouth every morning.       Marland Kitchen b complex vitamins capsule Take 1 capsule by mouth daily.      . beclomethasone (QVAR) 80 MCG/ACT inhaler Inhale 1 puff into the lungs 2 (two) times daily.      . Cholecalciferol (VITAMIN D3 SUPER STRENGTH) 2000 UNITS TABS Take 1 tablet by mouth daily.      . clonazePAM (KLONOPIN) 2 MG tablet Take 2 mg by mouth 2 (two) times daily.       . fish oil-omega-3 fatty acids 1000 MG capsule Take by mouth daily.       . montelukast (SINGULAIR) 10 MG tablet Take 10 mg by mouth at bedtime.      . primidone (MYSOLINE) 50 MG tablet Take 1 tablet (50 mg total) by mouth at bedtime.  30 tablet  3  . Probiotic Product (ALIGN) 4 MG CAPS Take 1 capsule by mouth daily.      Marland Kitchen tiotropium (SPIRIVA) 18 MCG inhalation capsule Place 18 mcg into inhaler and inhale daily.        Marland Kitchen triamcinolone (NASACORT) 55 MCG/ACT nasal inhaler Place 2 sprays into the nose as needed. For allergies      . DISCONTD: DICYCLOMINE HCL PO Take 1 tablet by mouth daily.         Physical Findings: The patient is in no acute  distress. Patient is alert and oriented.  weight is 142 lb 9.6 oz (64.683 kg). His oral temperature is 98.3 F (36.8 C). His blood pressure is 107/53 and his pulse is 91. His respiration is 20. Marland Kitchen  No significant changes.  Lab Findings: Lab Results  Component Value Date   WBC 6.5 04/13/2012   HGB 11.7* 04/13/2012   HCT 36.2* 04/13/2012   MCV 81.5 04/13/2012   PLT 125* 04/13/2012    @LASTCHEM @  Radiographic Findings: Ct Chest Wo Contrast  06/26/2012  *RADIOLOGY REPORT*  Clinical Data: History of lung cancer status post radiation therapy.  Restaging scan.  CT CHEST WITHOUT CONTRAST  Technique:  Multidetector CT imaging of the chest was performed following the standard protocol without IV contrast.  Comparison: Multiple priors, most recently PET CT 03/25/2012.  Findings:  Mediastinum: Heart size is normal. There is no significant pericardial fluid, thickening or pericardial calcification. There is atherosclerosis of the thoracic aorta, the great vessels of the mediastinum and the coronary arteries, including calcified atherosclerotic plaque in the left main, left anterior descending  and right coronary arteries. Conglomerate soft tissue is again noted in the right hilar region, at least some of which likely represents lymphadenopathy (difficult to accurately assess on this noncontrast CT examination).  Right retrocrural lymph node measures 11 mm.  The esophagus is unremarkable in appearance.    Numerous calcified right hilar and mediastinal lymph nodes are again noted.  Lungs/Pleura: There is again a large mass-like opacity in the medial aspect of the right lower lobe that measures up to approximately 5.1 x 3.5 cm.  This is contiguous with abnormal right hilar soft tissue. Surrounding postradiation scarring in the adjacent right lung, with some associated cylindrical and varicose bronchiectasis is again noted and similar to prior examinations. The there is again a background of mild - moderate centrilobular  and paraseptal emphysema and mild diffuse bronchial wall thickening.  Throughout the periphery of the lungs bilaterally there are areas of subpleural reticulation with multiple areas of frank honeycombing, suggesting a superimposed interstitial lung disease such as usual interstitial pneumonia (UIP).  Numerous calcified pulmonary nodules are compatible with calcified granulomas.  Upper Abdomen: Cholelithiasis without findings to suggest acute cholecystitis.  Numerous calcifications in the spleen are compatible with granulomas.  Numerous lesions in the visualized upper kidneys bilaterally appears similar to prior examinations, but are not characterized on today's noncontrast CT examination.  Musculoskeletal: There are no aggressive appearing lytic or blastic lesions noted in the visualized portions of the skeleton.  IMPRESSION: 1.  Right lower lobe mass is similar in appearance to prior examinations and measures approximately 5.1 x 3.5 cm on today's study.  This mass is contiguous with the adjacent right hilar lymphadenopathy.  There is also an enlarged right retrocrural lymph node which is similar to the prior examination. 2.  Postradiation changes in the right lung parenchyma around the right lower lobe mass, however, there also appears to be a background of interstitial lung disease, favored to reflect usual interstitial pneumonia (UIP), as detailed above. 3.  There is also a background of mild - moderate centrilobular paraseptal emphysema with mild diffuse bronchial wall thickening; findings compatible with COPD. 4. Atherosclerosis, including left main and two-vessel coronary artery disease. 5.  Sequelae of old granulomatous disease, as above. 6.  Cholelithiasis without findings to suggest acute cholecystitis. 7.  Additional incidental findings, as above.   Original Report Authenticated By: Florencia Reasons, M.D.     Impression:  The patient is recovering from the effects of radiation.  Tumor stable after  radiation.  No signs of pneumonitis.  Plan:  Follow-up CT in 3 months.  _____________________________________  Artist Pais. Kathrynn Running, M.D.

## 2012-06-28 ENCOUNTER — Telehealth: Payer: Self-pay | Admitting: *Deleted

## 2012-06-28 NOTE — Telephone Encounter (Signed)
CALLED PATIENT TO INFORM OF TEST, LVM FOR A RETURN CALL 

## 2012-07-04 ENCOUNTER — Emergency Department (HOSPITAL_COMMUNITY)
Admission: EM | Admit: 2012-07-04 | Discharge: 2012-07-04 | Disposition: A | Discharge status: Home / Self Care | Payer: Medicare Other | Attending: Emergency Medicine | Admitting: Emergency Medicine

## 2012-07-04 ENCOUNTER — Emergency Department (HOSPITAL_COMMUNITY): Payer: Medicare Other

## 2012-07-04 ENCOUNTER — Encounter (HOSPITAL_COMMUNITY): Payer: Self-pay | Admitting: *Deleted

## 2012-07-04 DIAGNOSIS — E119 Type 2 diabetes mellitus without complications: Secondary | ICD-10-CM | POA: Insufficient documentation

## 2012-07-04 DIAGNOSIS — S0990XA Unspecified injury of head, initial encounter: Secondary | ICD-10-CM

## 2012-07-04 DIAGNOSIS — Y929 Unspecified place or not applicable: Secondary | ICD-10-CM | POA: Insufficient documentation

## 2012-07-04 DIAGNOSIS — G8929 Other chronic pain: Secondary | ICD-10-CM | POA: Insufficient documentation

## 2012-07-04 DIAGNOSIS — S51809A Unspecified open wound of unspecified forearm, initial encounter: Secondary | ICD-10-CM | POA: Insufficient documentation

## 2012-07-04 DIAGNOSIS — F3289 Other specified depressive episodes: Secondary | ICD-10-CM | POA: Insufficient documentation

## 2012-07-04 DIAGNOSIS — E785 Hyperlipidemia, unspecified: Secondary | ICD-10-CM | POA: Insufficient documentation

## 2012-07-04 DIAGNOSIS — IMO0002 Reserved for concepts with insufficient information to code with codable children: Secondary | ICD-10-CM

## 2012-07-04 DIAGNOSIS — D491 Neoplasm of unspecified behavior of respiratory system: Secondary | ICD-10-CM | POA: Insufficient documentation

## 2012-07-04 DIAGNOSIS — Z7982 Long term (current) use of aspirin: Secondary | ICD-10-CM | POA: Insufficient documentation

## 2012-07-04 DIAGNOSIS — F329 Major depressive disorder, single episode, unspecified: Secondary | ICD-10-CM | POA: Insufficient documentation

## 2012-07-04 DIAGNOSIS — Y939 Activity, unspecified: Secondary | ICD-10-CM | POA: Insufficient documentation

## 2012-07-04 DIAGNOSIS — J45909 Unspecified asthma, uncomplicated: Secondary | ICD-10-CM | POA: Insufficient documentation

## 2012-07-04 DIAGNOSIS — Z79899 Other long term (current) drug therapy: Secondary | ICD-10-CM | POA: Insufficient documentation

## 2012-07-04 DIAGNOSIS — Z87442 Personal history of urinary calculi: Secondary | ICD-10-CM | POA: Insufficient documentation

## 2012-07-04 DIAGNOSIS — W1809XA Striking against other object with subsequent fall, initial encounter: Secondary | ICD-10-CM | POA: Insufficient documentation

## 2012-07-04 DIAGNOSIS — Z8546 Personal history of malignant neoplasm of prostate: Secondary | ICD-10-CM | POA: Insufficient documentation

## 2012-07-04 DIAGNOSIS — Z87891 Personal history of nicotine dependence: Secondary | ICD-10-CM | POA: Insufficient documentation

## 2012-07-04 DIAGNOSIS — Z8719 Personal history of other diseases of the digestive system: Secondary | ICD-10-CM | POA: Insufficient documentation

## 2012-07-04 DIAGNOSIS — F411 Generalized anxiety disorder: Secondary | ICD-10-CM | POA: Insufficient documentation

## 2012-07-04 NOTE — ED Notes (Signed)
Per EMS pt fell at home, pt has a laceration on his right arm that was bandaged by EMS. Pt sts he hit his head and c/o headache. Pt sts no back or neck pain.

## 2012-07-09 ENCOUNTER — Ambulatory Visit: Payer: Medicare Other | Admitting: Emergency Medicine

## 2012-07-10 ENCOUNTER — Ambulatory Visit (INDEPENDENT_AMBULATORY_CARE_PROVIDER_SITE_OTHER): Payer: Medicare Other | Admitting: Emergency Medicine

## 2012-07-10 ENCOUNTER — Encounter: Payer: Self-pay | Admitting: Emergency Medicine

## 2012-07-10 VITALS — BP 104/60 | HR 74 | Ht 65.0 in | Wt 150.6 lb

## 2012-07-10 DIAGNOSIS — J8410 Pulmonary fibrosis, unspecified: Secondary | ICD-10-CM

## 2012-07-10 DIAGNOSIS — C3490 Malignant neoplasm of unspecified part of unspecified bronchus or lung: Secondary | ICD-10-CM

## 2012-07-10 NOTE — Assessment & Plan Note (Signed)
Following with Dr Kathrynn Running, plan for CT scan in January

## 2012-07-10 NOTE — ED Provider Notes (Signed)
History    78 year old male presenting after a fall. Mechanical in nature. Patient states that his hoarseness balance. He did strike his head. He is complaining of a mild headache. There is no loss of consciousness. No change in mental status per family at bedside. Denies neck or back pain. Skin tear to his right forearm. Denies any other complaints or injuries. No use of blood thinning medication aside from 81 mg of aspirin daily.  CSN: [id number]  Arrival date & time 07/04/12  1713   First MD Initiated Contact with Patient 07/04/12 1947      Chief Complaint  Patient presents with  . Fall    (Consider location/radiation/quality/duration/timing/severity/associated sxs/prior treatment) HPI  Past Medical History  Diagnosis Date  . Anxiety   . Asthma   . Depression   . Kidney stones     hx of   . Pulmonary fibrosis   . Hx of radiation therapy may 2010 thru june 2010    5 tx, stereotactic radiotherapy  . Tremor, essential   . COPD (chronic obstructive pulmonary disease)   . DM (diabetes mellitus)   . Normocytic anemia   . Diverticulosis   . Allergic rhinitis   . Hyperlipidemia   . Adenomatous colon polyp   . Chronic left shoulder pain   . Chronic constipation   . Hemorrhoids   . Cellulitis of leg, right   . Edema   . Radiation May/June 2010    stereotactic body radiotherapy  . Urethral stricture 04/18/2012    Dr mcdermot/urology  . GERD (gastroesophageal reflux disease)   . Hx of radiation therapy 05/08/12 -05/21/12    RLL lung  . Zenker's diverticulum   . Prostate cancer 2005    gleason 4+4=8  . Lung cancer 2010, 2013    RLL, 2013 recurrent     Past Surgical History  Procedure Date  . Appendectomy   . Hdr brachytherapy implant for prostate cancer 2005  . Cataract extraction, bilateral   . Internal urethrotomy 12/09/2008  . Colonoscopy October 2008    Diverticulosis, hemorrhoids  . Tonsillectomy 1943  . Ct biopsy 04/11/2012    invasive squamous cell carcinoma      Family History  Problem Relation Age of Onset  . Prostate cancer Brother   . Colon cancer Sister   . Bladder Cancer Father   . Lung cancer Brother   . Heart disease Brother     History  Substance Use Topics  . Smoking status: Former Smoker -- 1.0 packs/day for 35 years    Quit date: 08/14/1989  . Smokeless tobacco: Never Used  . Alcohol Use: No     Comment: none in 10 years      Review of Systems   Review of symptoms negative unless otherwise noted in HPI.   Allergies  Iodine and Sulfonamide derivatives  Home Medications   Current Outpatient Rx  Name  Route  Sig  Dispense  Refill  . AMBULATORY NON FORMULARY MEDICATION      Oxygen At bedtime as directed            . ASPIRIN EC 81 MG PO TBEC   Oral   Take 81 mg by mouth every morning.          . B COMPLEX VITAMINS PO CAPS   Oral   Take 1 capsule by mouth daily.         . BECLOMETHASONE DIPROPIONATE 80 MCG/ACT IN AERS   Inhalation   Inhale 1 puff into  the lungs 2 (two) times daily.         . CHOLECALCIFEROL 2000 UNITS PO TABS   Oral   Take 1 tablet by mouth daily.         Marland Kitchen CLONAZEPAM 2 MG PO TABS   Oral   Take 2 mg by mouth 2 (two) times daily.          . OMEGA-3 FATTY ACIDS 1000 MG PO CAPS   Oral   Take by mouth daily.          Marland Kitchen MONTELUKAST SODIUM 10 MG PO TABS   Oral   Take 10 mg by mouth at bedtime.         Marland Kitchen ALIGN 4 MG PO CAPS   Oral   Take 1 capsule by mouth daily.         Marland Kitchen TIOTROPIUM BROMIDE MONOHYDRATE 18 MCG IN CAPS   Inhalation   Place 18 mcg into inhaler and inhale daily.           . TRIAMCINOLONE ACETONIDE 55 MCG/ACT NA INHA   Nasal   Place 2 sprays into the nose as needed. For allergies         . PRIMIDONE 50 MG PO TABS   Oral   Take 50 mg by mouth at bedtime.           BP 134/87  Pulse 64  Temp 97.2 F (36.2 C) (Oral)  Resp 16  SpO2 96%  Physical Exam  Nursing note and vitals reviewed. Constitutional: He appears well-developed and  well-nourished. No distress.  HENT:  Head: Normocephalic and atraumatic.  Eyes: Conjunctivae normal are normal. Right eye exhibits no discharge. Left eye exhibits no discharge.  Neck: Neck supple.  Cardiovascular: Normal rate, regular rhythm and normal heart sounds.  Exam reveals no gallop and no friction rub.   No murmur heard. Pulmonary/Chest: Effort normal and breath sounds normal. No respiratory distress.  Abdominal: Soft. He exhibits no distension. There is no tenderness.  Musculoskeletal: He exhibits no edema and no tenderness.       No midline spinal tenderness  Neurological: He is alert.       No deficits noted from patient's described baseline.  Skin: Skin is warm and dry.       Skin tear to the right proximal forearm. No underlying bony tenderness. Neurovascularly intact distally to  Psychiatric: He has a normal mood and affect. His behavior is normal. Thought content normal.    ED Course  Procedures (including critical care time)  Labs Reviewed - No data to display No results found.  Ct Head Wo Contrast  07/04/2012  *RADIOLOGY REPORT*  Clinical Data:  Headache following a head injury.  Lung cancer.  CT HEAD WITHOUT CONTRAST CT CERVICAL SPINE WITHOUT CONTRAST  Technique:  Multidetector CT imaging of the head and cervical spine was performed following the standard protocol without intravenous contrast.  Multiplanar CT image reconstructions of the cervical spine were also generated.  Comparison:  Previous examinations, including the head CT dated 04/20/2010.  CT HEAD  Findings: Stable enlarged ventricles and subarachnoid spaces.  No skull fracture, intracranial hemorrhage or paranasal sinus air- fluid levels.  No mass lesions or CT evidence of acute infarction. Stable left external capsule lacunar infarct.  IMPRESSION:  1.  No acute abnormality. 2.  Stable mild atrophy and old left external capsule lacunar infarct.  CT CERVICAL SPINE  Findings: Multilevel degenerative changes.  These  include facet degenerative changes with associated mild  anterolisthesis at the C6- 7 and C7-T1 levels.  No prevertebral soft tissue swelling or fractures.  Levoconvex cervicothoracic scoliosis.  Small amount of carotid artery atheromatous calcification on the right.  Mild bullous changes and scarring at both lung apices.  IMPRESSION:  1.  No fracture. 2.  Multilevel degenerative changes with associated mild subluxations, as described above. 3.  Mild right carotid artery atheromatous calcification. 4.  COPD.   Original Report Authenticated By: Beckie Salts, M.D.    Ct Chest Wo Contrast  06/26/2012  *RADIOLOGY REPORT*  Clinical Data: History of lung cancer status post radiation therapy.  Restaging scan.  CT CHEST WITHOUT CONTRAST  Technique:  Multidetector CT imaging of the chest was performed following the standard protocol without IV contrast.  Comparison: Multiple priors, most recently PET CT 03/25/2012.  Findings:  Mediastinum: Heart size is normal. There is no significant pericardial fluid, thickening or pericardial calcification. There is atherosclerosis of the thoracic aorta, the great vessels of the mediastinum and the coronary arteries, including calcified atherosclerotic plaque in the left main, left anterior descending and right coronary arteries. Conglomerate soft tissue is again noted in the right hilar region, at least some of which likely represents lymphadenopathy (difficult to accurately assess on this noncontrast CT examination).  Right retrocrural lymph node measures 11 mm.  The esophagus is unremarkable in appearance.    Numerous calcified right hilar and mediastinal lymph nodes are again noted.  Lungs/Pleura: There is again a large mass-like opacity in the medial aspect of the right lower lobe that measures up to approximately 5.1 x 3.5 cm.  This is contiguous with abnormal right hilar soft tissue. Surrounding postradiation scarring in the adjacent right lung, with some associated cylindrical  and varicose bronchiectasis is again noted and similar to prior examinations. The there is again a background of mild - moderate centrilobular and paraseptal emphysema and mild diffuse bronchial wall thickening.  Throughout the periphery of the lungs bilaterally there are areas of subpleural reticulation with multiple areas of frank honeycombing, suggesting a superimposed interstitial lung disease such as usual interstitial pneumonia (UIP).  Numerous calcified pulmonary nodules are compatible with calcified granulomas.  Upper Abdomen: Cholelithiasis without findings to suggest acute cholecystitis.  Numerous calcifications in the spleen are compatible with granulomas.  Numerous lesions in the visualized upper kidneys bilaterally appears similar to prior examinations, but are not characterized on today's noncontrast CT examination.  Musculoskeletal: There are no aggressive appearing lytic or blastic lesions noted in the visualized portions of the skeleton.  IMPRESSION: 1.  Right lower lobe mass is similar in appearance to prior examinations and measures approximately 5.1 x 3.5 cm on today's study.  This mass is contiguous with the adjacent right hilar lymphadenopathy.  There is also an enlarged right retrocrural lymph node which is similar to the prior examination. 2.  Postradiation changes in the right lung parenchyma around the right lower lobe mass, however, there also appears to be a background of interstitial lung disease, favored to reflect usual interstitial pneumonia (UIP), as detailed above. 3.  There is also a background of mild - moderate centrilobular paraseptal emphysema with mild diffuse bronchial wall thickening; findings compatible with COPD. 4. Atherosclerosis, including left main and two-vessel coronary artery disease. 5.  Sequelae of old granulomatous disease, as above. 6.  Cholelithiasis without findings to suggest acute cholecystitis. 7.  Additional incidental findings, as above.   Original Report  Authenticated By: Florencia Reasons, M.D.    Ct Cervical Spine Wo Contrast  07/04/2012  *  RADIOLOGY REPORT*  Clinical Data:  Headache following a head injury.  Lung cancer.  CT HEAD WITHOUT CONTRAST CT CERVICAL SPINE WITHOUT CONTRAST  Technique:  Multidetector CT imaging of the head and cervical spine was performed following the standard protocol without intravenous contrast.  Multiplanar CT image reconstructions of the cervical spine were also generated.  Comparison:  Previous examinations, including the head CT dated 04/20/2010.  CT HEAD  Findings: Stable enlarged ventricles and subarachnoid spaces.  No skull fracture, intracranial hemorrhage or paranasal sinus air- fluid levels.  No mass lesions or CT evidence of acute infarction. Stable left external capsule lacunar infarct.  IMPRESSION:  1.  No acute abnormality. 2.  Stable mild atrophy and old left external capsule lacunar infarct.  CT CERVICAL SPINE  Findings: Multilevel degenerative changes.  These include facet degenerative changes with associated mild anterolisthesis at the C6- 7 and C7-T1 levels.  No prevertebral soft tissue swelling or fractures.  Levoconvex cervicothoracic scoliosis.  Small amount of carotid artery atheromatous calcification on the right.  Mild bullous changes and scarring at both lung apices.  IMPRESSION:  1.  No fracture. 2.  Multilevel degenerative changes with associated mild subluxations, as described above. 3.  Mild right carotid artery atheromatous calcification. 4.  COPD.   Original Report Authenticated By: Beckie Salts, M.D.     1. Skin tear   2. Head injury       MDM  78 year old male with mechanical fall. Skin tear to his right arm which was reapproximated with Steri-Strips. Patient did hit his head. No loss of consciousness. Nonfocal neurological examination. Baseline mental status per family at bedside. Imaging of the head and neck did not show any acute abnormalities.        Raeford Razor, MD 07/10/12  [phone]

## 2012-07-10 NOTE — Assessment & Plan Note (Addendum)
With hypoxemia - currently seems well compensated on 3L/min pulsed (or continuous if on his concentrator).  Not clear to me why he is on QVAR >> will d/c this

## 2012-07-10 NOTE — Progress Notes (Signed)
History of Present Illness:  78 yo former smoker with hx COPD, prostatic CA s/p resection 2005, ureteral strictures. Evaluated in ED 3/10 for abd pain, suspected from full bladder, some tremor and dysarthria. Underwent MRI abdomen that revealed RLL mass. Subsequent PET/CT also shows subpleural ILD. Has albuterol, rarely requires. uses Spiriva daily.   12/31/08 -- Pt returns to [facility] today s/p needle bx of his RUL mass. The bx shows non-small cell lung CA, with features suggestive of adenoCA. Has completed swallowing eval for his ILD w/u.   ROV 10/27/09 -- Returns today to tell me that he is having more trouble swalowing. Food sems to go down OK, but he has consistent coughing with thin liquids. His speech Rx eval in May 10 recommended thin liquids Completed radiation in June 10. Followed by Dr Kathrynn Running with serial scans, next one to be in May 11. Wt stable. Some stable exertional SOB. No exacerbations of COPD in last year.   ROV 11/30/09 -- follows up to discuss swallow eval results. Tells me today he has been more fatigued, much more tired since last visit. His diet has "gone to hell" since his swallowing eval showed that he needs nectar thick liquids, has silent aspiration of large bolused solids.   ROV 02/16/10 -- returns for f/u of his ILD from aspiration, COPD, adenoCA s/p XRT. Since last visit has had CT scan in 5/11 that showed some scarring in the region of his lung mass. Last time we changed back to his usual diet because the modified diet was intolerable. His wt is up some 5 lbs. Still trying to use thickener in his drinks.   ROV 06/29/10 -- hx chronic aspiration and ILD, adenoCA s/p XRT. Since last visit he has several falls, still lives alone. Tells me that he is feeling OK, but the thickened diet is making him constipated. Miralax was started about 4 days ago. Was just in [place] for rehab after discharged for a fall. No aspiration symptoms, no cough with taking by mouth as long as he thinckens. Spiriva  was substituted with atrovent briefly, now back on Spiriva - missed it.   ROV 05/28/12 -- hx chronic aspiration and ILD, adenoCA s/p XRT. Returns for eval progressive dyspnea. 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 Comparison of PET 7/22 to  CT scan from 01/2011  And 07/2011 show stable ILD changes. He tells me that minimal exertion is now limiting him - SOB after 50 - 60 feet walking. 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 Was confused in late October, possibly due to the XRT. Suffered a fall 11/7 >> CT scan head and C-spine OK. Has been having trouble getting his O2 clarified - was initially on eclipse, then to concentrator + liquid o2 and pulsed O2. Currently set on 3L/min pulsed. Feels his MS is back to normal.    Filed Vitals:   07/10/12 1413  BP: 104/60  Pulse: 74   Gen: Pleasant, well-nourished, in no distress,  normal affect  ENT: No lesions,  mouth clear,  oropharynx clear, no postnasal drip  Neck: No JVD, no TMG, no carotid bruits  Lungs: No use of accessory muscles, bilateral insp crackles all fields  Cardiovascular: RRR, heart sounds normal, no murmur or gallops, no  peripheral edema  Musculoskeletal: No deformities, no cyanosis, significant + clubbing  Neuro: alert, significant tremor, good strength.   Skin: Warm, no lesions or rashes  Lung cancer Following with Dr Kathrynn Running, plan for CT scan in January  INTERSTITIAL LUNG DISEASE With hypoxemia - currently seems well compensated on 3L/min pulsed (or continuous if on his concentrator).  Not clear to me why he is on QVAR >> will d/c this

## 2012-07-10 NOTE — Patient Instructions (Addendum)
Please stop QVAR and Nasocort nose spray You may adjust your oxygen to keep your saturations above 90% Follow with Dr Kathrynn Running as planned Follow with Dr Delton Coombes in 4 months or sooner if you have any problems.

## 2012-07-18 ENCOUNTER — Other Ambulatory Visit: Payer: Self-pay

## 2012-07-18 NOTE — Telephone Encounter (Signed)
A user error has taken place: encounter opened in error, closed for administrative reasons.

## 2012-07-19 ENCOUNTER — Telehealth: Payer: Self-pay

## 2012-07-19 MED ORDER — FENOFIBRATE 145 MG PO TABS: 145.0000 mg | ORAL_TABLET | Freq: Every day | ORAL | Status: DC

## 2012-07-19 NOTE — Telephone Encounter (Signed)
Done erx 

## 2012-07-19 NOTE — Telephone Encounter (Signed)
[facility] requesting a refill on Tricor 145mg  medication is not on current list please advise if ok to fill

## 2012-07-29 ENCOUNTER — Other Ambulatory Visit: Payer: Self-pay

## 2012-07-29 MED ORDER — FENOFIBRATE 145 MG PO TABS: 145.0000 mg | ORAL_TABLET | Freq: Every day | ORAL | Status: DC

## 2012-08-23 ENCOUNTER — Emergency Department (HOSPITAL_BASED_OUTPATIENT_CLINIC_OR_DEPARTMENT_OTHER)
Admission: EM | Admit: 2012-08-23 | Discharge: 2012-08-24 | Disposition: A | Discharge status: Home / Self Care | Payer: Medicare Other | Attending: Emergency Medicine | Admitting: Emergency Medicine

## 2012-08-23 ENCOUNTER — Emergency Department (HOSPITAL_BASED_OUTPATIENT_CLINIC_OR_DEPARTMENT_OTHER): Payer: Medicare Other

## 2012-08-23 ENCOUNTER — Encounter (HOSPITAL_BASED_OUTPATIENT_CLINIC_OR_DEPARTMENT_OTHER): Payer: Self-pay

## 2012-08-23 DIAGNOSIS — E785 Hyperlipidemia, unspecified: Secondary | ICD-10-CM | POA: Insufficient documentation

## 2012-08-23 DIAGNOSIS — F329 Major depressive disorder, single episode, unspecified: Secondary | ICD-10-CM | POA: Insufficient documentation

## 2012-08-23 DIAGNOSIS — M25519 Pain in unspecified shoulder: Secondary | ICD-10-CM | POA: Insufficient documentation

## 2012-08-23 DIAGNOSIS — R0602 Shortness of breath: Secondary | ICD-10-CM | POA: Insufficient documentation

## 2012-08-23 DIAGNOSIS — Z8546 Personal history of malignant neoplasm of prostate: Secondary | ICD-10-CM | POA: Insufficient documentation

## 2012-08-23 DIAGNOSIS — Z872 Personal history of diseases of the skin and subcutaneous tissue: Secondary | ICD-10-CM | POA: Insufficient documentation

## 2012-08-23 DIAGNOSIS — Z8719 Personal history of other diseases of the digestive system: Secondary | ICD-10-CM | POA: Insufficient documentation

## 2012-08-23 DIAGNOSIS — G8929 Other chronic pain: Secondary | ICD-10-CM | POA: Insufficient documentation

## 2012-08-23 DIAGNOSIS — J3489 Other specified disorders of nose and nasal sinuses: Secondary | ICD-10-CM | POA: Insufficient documentation

## 2012-08-23 DIAGNOSIS — Z860100 Personal history of colon polyps, unspecified: Secondary | ICD-10-CM | POA: Insufficient documentation

## 2012-08-23 DIAGNOSIS — J449 Chronic obstructive pulmonary disease, unspecified: Secondary | ICD-10-CM | POA: Insufficient documentation

## 2012-08-23 DIAGNOSIS — F3289 Other specified depressive episodes: Secondary | ICD-10-CM | POA: Insufficient documentation

## 2012-08-23 DIAGNOSIS — J45909 Unspecified asthma, uncomplicated: Secondary | ICD-10-CM | POA: Insufficient documentation

## 2012-08-23 DIAGNOSIS — Z87442 Personal history of urinary calculi: Secondary | ICD-10-CM | POA: Insufficient documentation

## 2012-08-23 DIAGNOSIS — J4489 Other specified chronic obstructive pulmonary disease: Secondary | ICD-10-CM | POA: Insufficient documentation

## 2012-08-23 DIAGNOSIS — Z87891 Personal history of nicotine dependence: Secondary | ICD-10-CM | POA: Insufficient documentation

## 2012-08-23 DIAGNOSIS — J069 Acute upper respiratory infection, unspecified: Secondary | ICD-10-CM | POA: Insufficient documentation

## 2012-08-23 DIAGNOSIS — F411 Generalized anxiety disorder: Secondary | ICD-10-CM | POA: Insufficient documentation

## 2012-08-23 DIAGNOSIS — Z79899 Other long term (current) drug therapy: Secondary | ICD-10-CM | POA: Insufficient documentation

## 2012-08-23 DIAGNOSIS — J309 Allergic rhinitis, unspecified: Secondary | ICD-10-CM | POA: Insufficient documentation

## 2012-08-23 DIAGNOSIS — Z8709 Personal history of other diseases of the respiratory system: Secondary | ICD-10-CM | POA: Insufficient documentation

## 2012-08-23 DIAGNOSIS — C3490 Malignant neoplasm of unspecified part of unspecified bronchus or lung: Secondary | ICD-10-CM | POA: Insufficient documentation

## 2012-08-23 DIAGNOSIS — E119 Type 2 diabetes mellitus without complications: Secondary | ICD-10-CM | POA: Insufficient documentation

## 2012-08-23 DIAGNOSIS — Z862 Personal history of diseases of the blood and blood-forming organs and certain disorders involving the immune mechanism: Secondary | ICD-10-CM | POA: Insufficient documentation

## 2012-08-23 DIAGNOSIS — J029 Acute pharyngitis, unspecified: Secondary | ICD-10-CM | POA: Insufficient documentation

## 2012-08-23 DIAGNOSIS — Z7982 Long term (current) use of aspirin: Secondary | ICD-10-CM | POA: Insufficient documentation

## 2012-08-23 DIAGNOSIS — IMO0002 Reserved for concepts with insufficient information to code with codable children: Secondary | ICD-10-CM | POA: Insufficient documentation

## 2012-08-23 DIAGNOSIS — K5900 Constipation, unspecified: Secondary | ICD-10-CM | POA: Insufficient documentation

## 2012-08-23 DIAGNOSIS — Z923 Personal history of irradiation: Secondary | ICD-10-CM | POA: Insufficient documentation

## 2012-08-23 DIAGNOSIS — Z87448 Personal history of other diseases of urinary system: Secondary | ICD-10-CM | POA: Insufficient documentation

## 2012-08-23 LAB — CBC WITH DIFFERENTIAL/PLATELET
Basophils Absolute: 0 10*3/uL (ref 0.0–0.1)
Basophils Relative: 0 % (ref 0–1)
Eosinophils Relative: 2 % (ref 0–5)
Hemoglobin: 12.2 g/dL — ABNORMAL LOW (ref 13.0–17.0)
Lymphocytes Relative: 6 % — ABNORMAL LOW (ref 12–46)
Lymphs Abs: 0.4 10*3/uL — ABNORMAL LOW (ref 0.7–4.0)
Neutrophils Relative %: 87 % — ABNORMAL HIGH (ref 43–77)
RBC: 4.51 MIL/uL (ref 4.22–5.81)
WBC: 5.9 10*3/uL (ref 4.0–10.5)

## 2012-08-23 LAB — BASIC METABOLIC PANEL
CO2: 32 mEq/L (ref 19–32)
Calcium: 9.2 mg/dL (ref 8.4–10.5)
Chloride: 98 mEq/L (ref 96–112)
Sodium: 140 mEq/L (ref 135–145)

## 2012-08-23 LAB — RAPID STREP SCREEN (MED CTR MEBANE ONLY): Streptococcus, Group A Screen (Direct): NEGATIVE

## 2012-08-23 MED ORDER — LEVOFLOXACIN 500 MG PO TABS: 500.0000 mg | ORAL_TABLET | Freq: Every day | ORAL | Status: DC

## 2012-08-23 MED ORDER — LEVOFLOXACIN 750 MG PO TABS
750.0000 mg | ORAL_TABLET | Freq: Once | ORAL | Status: AC
  Administered 2012-08-23: 750 mg via ORAL
  Filled 2012-08-23: qty 1

## 2012-08-23 NOTE — ED Provider Notes (Signed)
History     CSN: [id number]  Arrival date & time 08/23/12  2013   First MD Initiated Contact with Patient 08/23/12 2048      Chief Complaint  Patient presents with  . Shortness of Breath    (Consider location/radiation/quality/duration/timing/severity/associated sxs/prior treatment) Patient is a 78 y.o. male presenting with URI. The history is provided by the patient and a relative.  URI The primary symptoms include sore throat and cough. Primary symptoms do not include fever, wheezing, nausea or vomiting. The current episode started yesterday. This is a new problem. The problem has not changed since onset. The cough began yesterday. The cough is productive.  The onset of the illness is associated with exposure to sick contacts (multiple family members with uri sx). Symptoms associated with the illness include congestion and rhinorrhea. The illness is not associated with chills. Risk factors for severe complications from URI include being elderly.    Past Medical History  Diagnosis Date  . Anxiety   . Asthma   . Depression   . Kidney stones     hx of   . Pulmonary fibrosis   . Hx of radiation therapy may 2010 thru june 2010    5 tx, stereotactic radiotherapy  . Tremor, essential   . COPD (chronic obstructive pulmonary disease)   . DM (diabetes mellitus)   . Normocytic anemia   . Diverticulosis   . Allergic rhinitis   . Hyperlipidemia   . Adenomatous colon polyp   . Chronic left shoulder pain   . Chronic constipation   . Hemorrhoids   . Cellulitis of leg, right   . Edema   . Radiation May/June 2010    stereotactic body radiotherapy  . Urethral stricture 04/18/2012    Dr mcdermot/urology  . GERD (gastroesophageal reflux disease)   . Hx of radiation therapy 05/08/12 -05/21/12    RLL lung  . Zenker's diverticulum   . Prostate cancer 2005    gleason 4+4=8  . Lung cancer 2010, 2013    RLL, 2013 recurrent     Past Surgical History  Procedure Date  . Appendectomy     . Hdr brachytherapy implant for prostate cancer 2005  . Cataract extraction, bilateral   . Internal urethrotomy 12/09/2008  . Colonoscopy October 2008    Diverticulosis, hemorrhoids  . Tonsillectomy 1943  . Ct biopsy 04/11/2012    invasive squamous cell carcinoma    Family History  Problem Relation Age of Onset  . Prostate cancer Brother   . Colon cancer Sister   . Bladder Cancer Father   . Lung cancer Brother   . Heart disease Brother     History  Substance Use Topics  . Smoking status: Former Smoker -- 1.0 packs/day for 35 years    Quit date: 08/14/1989  . Smokeless tobacco: Never Used  . Alcohol Use: No     Comment: none in 10 years      Review of Systems  Constitutional: Negative for fever and chills.  HENT: Positive for congestion, sore throat and rhinorrhea.   Respiratory: Positive for cough and shortness of breath. Negative for wheezing.   Gastrointestinal: Negative for nausea and vomiting.  All other systems reviewed and are negative.    Allergies  Iodine and Sulfonamide derivatives  Home Medications   Current Outpatient Rx  Name  Route  Sig  Dispense  Refill  . AMBULATORY NON FORMULARY MEDICATION      Oxygen At bedtime as directed            .  ASPIRIN EC 81 MG PO TBEC   Oral   Take 81 mg by mouth every morning.          . B COMPLEX VITAMINS PO CAPS   Oral   Take 1 capsule by mouth daily.         . BECLOMETHASONE DIPROPIONATE 80 MCG/ACT IN AERS   Inhalation   Inhale 1 puff into the lungs 2 (two) times daily.         . CHOLECALCIFEROL 2000 UNITS PO TABS   Oral   Take 1 tablet by mouth daily.         Marland Kitchen CLONAZEPAM 2 MG PO TABS   Oral   Take 2 mg by mouth 2 (two) times daily.          . FENOFIBRATE 145 MG PO TABS   Oral   Take 1 tablet (145 mg total) by mouth daily.   90 tablet   3   . OMEGA-3 FATTY ACIDS 1000 MG PO CAPS   Oral   Take by mouth daily.          Marland Kitchen MONTELUKAST SODIUM 10 MG PO TABS   Oral   Take 10 mg by  mouth at bedtime.         Marland Kitchen PRIMIDONE 50 MG PO TABS   Oral   Take 50 mg by mouth at bedtime.         Marland Kitchen ALIGN 4 MG PO CAPS   Oral   Take 1 capsule by mouth daily.         Marland Kitchen TIOTROPIUM BROMIDE MONOHYDRATE 18 MCG IN CAPS   Inhalation   Place 18 mcg into inhaler and inhale daily.           . TRIAMCINOLONE ACETONIDE 55 MCG/ACT NA INHA   Nasal   Place 2 sprays into the nose as needed. For allergies           BP 132/51  Pulse 98  Temp 98.8 F (37.1 C) (Rectal)  Resp 28  Ht 5\' 6"  (1.676 m)  Wt 147 lb (66.679 kg)  BMI 23.73 kg/m2  SpO2 95%  Physical Exam  Nursing note and vitals reviewed. Constitutional: He is oriented to person, place, and time. He appears well-developed and well-nourished. No distress.       Resting tremor  HENT:  Head: Normocephalic and atraumatic.  Right Ear: Tympanic membrane and ear canal normal.  Left Ear: Tympanic membrane and ear canal normal.  Mouth/Throat: Posterior oropharyngeal erythema present. No oropharyngeal exudate or posterior oropharyngeal edema.  Eyes: Conjunctivae normal and EOM are normal. Pupils are equal, round, and reactive to light.  Neck: Normal range of motion. Neck supple.  Cardiovascular: Normal rate, regular rhythm and intact distal pulses.   No murmur heard. Pulmonary/Chest: Effort normal. No respiratory distress. He has no wheezes. He has rales.       Fine crackles in all lung fields  Abdominal: Soft. He exhibits no distension. There is no tenderness. There is no rebound and no guarding.  Musculoskeletal: Normal range of motion. He exhibits no edema and no tenderness.  Lymphadenopathy:    He has no cervical adenopathy.  Neurological: He is alert and oriented to person, place, and time.  Skin: Skin is warm and dry. No rash noted. No erythema.  Psychiatric: He has a normal mood and affect. His behavior is normal.    ED Course  Procedures (including critical care time)  Labs Reviewed  CBC WITH DIFFERENTIAL -  Abnormal; Notable for the following:    Hemoglobin 12.2 (*)     HCT 38.8 (*)     RDW 16.8 (*)     Platelets 97 (*)     Neutrophils Relative 87 (*)     Lymphocytes Relative 6 (*)     Lymphs Abs 0.4 (*)     All other components within normal limits  BASIC METABOLIC PANEL - Abnormal; Notable for the following:    Glucose, Bld 190 (*)     BUN 24 (*)     GFR calc non Af Amer 57 (*)     GFR calc Af Amer 67 (*)     All other components within normal limits  RAPID STREP SCREEN   Dg Chest 2 View  08/23/2012  *RADIOLOGY REPORT*  Clinical Data: Fever and cough  CHEST - 2 VIEW  Comparison: 06/26/2012  Findings: Heart size appears normal.  No pleural effusions are identified.  Advanced interstitial lung disease consistent with pulmonary fibrosis identified.  Right lower lobe mass is again noted and appears increased from previous exam.  IMPRESSION:  1.  Advanced pulmonary fibrosis. 2.  Increase in right lower lobe lung mass.   Original Report Authenticated By: Signa Kell, M.D.      Date: 08/23/2012  Rate: 93  Rhythm: normal sinus rhythm and sinus arrhythmia  QRS Axis: left  Intervals: normal  ST/T Wave abnormalities: nonspecific ST/T changes  Conduction Disutrbances:right bundle branch block  Narrative Interpretation:   Old EKG Reviewed: unchanged   1. URI (upper respiratory infection)       MDM   Patient with a history of right lower lobe lung cancer currently getting radiation therapy who has a history of pulmonary fibrosis and is on chronic O2 at 2 L and normally sats between 90 and 92% presents today do to 24 hours of productive cough, congestion sore throat and shortness of breath. He has multiple family members at home with similar symptoms. Here he has a normal blood pressure and after he sits down he is satting 91-93% on his home O2 settings. He is afebrile and has normal labs. Chest x-ray shows increase in his right lower lobe lung mass as compared to October. He states that he  did have his flu and pneumonia shot. Given his well appearance and normal lab tests will treat with Levaquin for possible secondary infection and have him followup with his PCP on Monday. He has good followup and good family support who will return if his symptoms worsen.        Gwyneth Sprout, MD 08/23/12 [phone]

## 2012-08-23 NOTE — ED Notes (Signed)
C/o sob, prod cough x 24 hrs-home O2 24/7-currently on [facility]

## 2012-08-26 ENCOUNTER — Telehealth: Payer: Self-pay | Admitting: Internal Medicine

## 2012-08-26 ENCOUNTER — Telehealth: Payer: Self-pay | Admitting: Neurology

## 2012-08-26 NOTE — Telephone Encounter (Signed)
The patient's daughter called to ask about his medications.  He was given a new rx and has been taking his previous rx and the new rx together. The patient's daughter is wondering if he should be doing this and would like to know the correct medication regimen and schedule.  Please call the patient's daughter at [phone].

## 2012-08-26 NOTE — Telephone Encounter (Signed)
Patient Information:  Caller Name: [name]  Phone: [phone]  Patient: Roy [name], Roy [name]  Gender: Male  DOB: Apr 01, 1921  Age: 78 Years  PCP: Oliver Barre (Adults only)  Office Follow Up:  Does the office need to follow up with this patient?: No  Instructions For The Office: N/A  RN Note:  ED follow up; cough.  Started levequin 08/23/12.  Doing better than when seen in ED.  Uses 3L/min oxygen on ambulation.  Per protocol, emergent symptoms denied; advised appt.  Appt scheduled 1530 08/27/12 with Ms. Sampson Si.  krs/can  Symptoms  Reason For Call & Symptoms: cough, shortness of breath, follow up from ED 08/23/12  Reviewed Health History In EMR: Yes  Reviewed Medications In EMR: Yes  Reviewed Allergies In EMR: Yes  Reviewed Surgeries / Procedures: Yes  Date of Onset of Symptoms: Unknown  Guideline(s) Used:  Cough  Disposition Per Guideline:   See Today or Tomorrow in Office  Reason For Disposition Reached:   Patient wants to be seen  Advice Given:  N/A  Appointment Scheduled:  08/27/2012 15:30:00 Appointment Scheduled Provider:  Nicki Reaper

## 2012-08-27 ENCOUNTER — Ambulatory Visit (INDEPENDENT_AMBULATORY_CARE_PROVIDER_SITE_OTHER): Payer: Medicare Other | Admitting: Internal Medicine

## 2012-08-27 ENCOUNTER — Inpatient Hospital Stay (HOSPITAL_COMMUNITY)
Admission: AD | Admit: 2012-08-27 | Discharge: 2012-08-29 | DRG: 189 | Disposition: A | Discharge status: Home / Self Care | Payer: Medicare Other | Source: Ambulatory Visit | Attending: Internal Medicine | Admitting: Internal Medicine

## 2012-08-27 ENCOUNTER — Encounter (HOSPITAL_COMMUNITY): Payer: Self-pay | Admitting: *Deleted

## 2012-08-27 ENCOUNTER — Encounter: Payer: Self-pay | Admitting: Internal Medicine

## 2012-08-27 ENCOUNTER — Inpatient Hospital Stay (HOSPITAL_COMMUNITY): Payer: Medicare Other

## 2012-08-27 VITALS — BP 130/62 | HR 75 | Temp 97.7°F

## 2012-08-27 DIAGNOSIS — R0602 Shortness of breath: Secondary | ICD-10-CM

## 2012-08-27 DIAGNOSIS — Z8546 Personal history of malignant neoplasm of prostate: Secondary | ICD-10-CM

## 2012-08-27 DIAGNOSIS — D638 Anemia in other chronic diseases classified elsewhere: Secondary | ICD-10-CM | POA: Diagnosis present

## 2012-08-27 DIAGNOSIS — J9600 Acute respiratory failure, unspecified whether with hypoxia or hypercapnia: Secondary | ICD-10-CM

## 2012-08-27 DIAGNOSIS — E785 Hyperlipidemia, unspecified: Secondary | ICD-10-CM

## 2012-08-27 DIAGNOSIS — J9691 Respiratory failure, unspecified with hypoxia: Secondary | ICD-10-CM

## 2012-08-27 DIAGNOSIS — Z87891 Personal history of nicotine dependence: Secondary | ICD-10-CM

## 2012-08-27 DIAGNOSIS — C61 Malignant neoplasm of prostate: Secondary | ICD-10-CM | POA: Diagnosis present

## 2012-08-27 DIAGNOSIS — G250 Essential tremor: Secondary | ICD-10-CM | POA: Diagnosis present

## 2012-08-27 DIAGNOSIS — J309 Allergic rhinitis, unspecified: Secondary | ICD-10-CM | POA: Diagnosis present

## 2012-08-27 DIAGNOSIS — K5900 Constipation, unspecified: Secondary | ICD-10-CM

## 2012-08-27 DIAGNOSIS — E119 Type 2 diabetes mellitus without complications: Secondary | ICD-10-CM

## 2012-08-27 DIAGNOSIS — D649 Anemia, unspecified: Secondary | ICD-10-CM | POA: Diagnosis present

## 2012-08-27 DIAGNOSIS — Z789 Other specified health status: Secondary | ICD-10-CM

## 2012-08-27 DIAGNOSIS — J9620 Acute and chronic respiratory failure, unspecified whether with hypoxia or hypercapnia: Principal | ICD-10-CM | POA: Diagnosis present

## 2012-08-27 DIAGNOSIS — J4489 Other specified chronic obstructive pulmonary disease: Secondary | ICD-10-CM

## 2012-08-27 DIAGNOSIS — C3490 Malignant neoplasm of unspecified part of unspecified bronchus or lung: Secondary | ICD-10-CM

## 2012-08-27 DIAGNOSIS — R5381 Other malaise: Secondary | ICD-10-CM

## 2012-08-27 DIAGNOSIS — R0989 Other specified symptoms and signs involving the circulatory and respiratory systems: Secondary | ICD-10-CM

## 2012-08-27 DIAGNOSIS — J449 Chronic obstructive pulmonary disease, unspecified: Secondary | ICD-10-CM | POA: Diagnosis present

## 2012-08-27 DIAGNOSIS — J45909 Unspecified asthma, uncomplicated: Secondary | ICD-10-CM | POA: Diagnosis present

## 2012-08-27 DIAGNOSIS — J8410 Pulmonary fibrosis, unspecified: Secondary | ICD-10-CM

## 2012-08-27 LAB — COMPREHENSIVE METABOLIC PANEL
ALT: 16 U/L (ref 0–53)
AST: 34 U/L (ref 0–37)
Albumin: 3.2 g/dL — ABNORMAL LOW (ref 3.5–5.2)
Alkaline Phosphatase: 65 U/L (ref 39–117)
BUN: 30 mg/dL — ABNORMAL HIGH (ref 6–23)
CO2: 33 mEq/L — ABNORMAL HIGH (ref 19–32)
Calcium: 9.8 mg/dL (ref 8.4–10.5)
Chloride: 103 mEq/L (ref 96–112)
Creatinine, Ser: 1.18 mg/dL (ref 0.50–1.35)
GFR calc Af Amer: 61 mL/min — ABNORMAL LOW (ref >90)
GFR calc non Af Amer: 53 mL/min — ABNORMAL LOW (ref >90)
Glucose, Bld: 101 mg/dL — ABNORMAL HIGH (ref 70–99)
Potassium: 4.5 mEq/L (ref 3.5–5.1)
Sodium: 143 mEq/L (ref 135–145)
Total Bilirubin: 0.3 mg/dL (ref 0.3–1.2)
Total Protein: 7.9 g/dL (ref 6.0–8.3)

## 2012-08-27 LAB — PHOSPHORUS: Phosphorus: 3.3 mg/dL (ref 2.3–4.6)

## 2012-08-27 LAB — CBC WITH DIFFERENTIAL/PLATELET
HCT: 38.5 % — ABNORMAL LOW (ref 39.0–52.0)
Hemoglobin: 12.4 g/dL — ABNORMAL LOW (ref 13.0–17.0)
Lymphocytes Relative: 7 % — ABNORMAL LOW (ref 12–46)
Lymphs Abs: 0.4 10*3/uL — ABNORMAL LOW (ref 0.7–4.0)
Monocytes Absolute: 0.6 10*3/uL (ref 0.1–1.0)
Monocytes Relative: 10 % (ref 3–12)
Neutro Abs: 5.1 10*3/uL (ref 1.7–7.7)
WBC: 6.4 10*3/uL (ref 4.0–10.5)

## 2012-08-27 LAB — PROTIME-INR: INR: 1.19 (ref 0.00–1.49)

## 2012-08-27 LAB — TSH: TSH: 1.411 u[IU]/mL (ref 0.350–4.500)

## 2012-08-27 LAB — APTT: aPTT: 35 seconds (ref 24–37)

## 2012-08-27 MED ORDER — B COMPLEX VITAMINS PO CAPS: 1.0000 | ORAL_CAPSULE | Freq: Every day | ORAL | Status: DC

## 2012-08-27 MED ORDER — HYDROCODONE-ACETAMINOPHEN 5-325 MG PO TABS
1.0000 | ORAL_TABLET | ORAL | Status: DC | PRN
  Administered 2012-08-29: 1 via ORAL
  Filled 2012-08-27: qty 1

## 2012-08-27 MED ORDER — IPRATROPIUM BROMIDE 0.02 % IN SOLN: 0.5000 mg | RESPIRATORY_TRACT | Status: DC | PRN

## 2012-08-27 MED ORDER — MORPHINE SULFATE 2 MG/ML IJ SOLN: 1.0000 mg | INTRAMUSCULAR | Status: DC | PRN

## 2012-08-27 MED ORDER — TIOTROPIUM BROMIDE MONOHYDRATE 18 MCG IN CAPS
18.0000 ug | ORAL_CAPSULE | Freq: Every day | RESPIRATORY_TRACT | Status: DC
  Administered 2012-08-28 – 2012-08-29 (×2): 18 ug via RESPIRATORY_TRACT
  Filled 2012-08-27: qty 5

## 2012-08-27 MED ORDER — FLUTICASONE PROPIONATE 50 MCG/ACT NA SUSP
2.0000 | Freq: Every day | NASAL | Status: DC
  Administered 2012-08-27 – 2012-08-29 (×2): 2 via NASAL
  Filled 2012-08-27: qty 16

## 2012-08-27 MED ORDER — SODIUM CHLORIDE 0.9 % IV SOLN
INTRAVENOUS | Status: DC
  Administered 2012-08-27: 19:00:00 via INTRAVENOUS

## 2012-08-27 MED ORDER — ALBUTEROL SULFATE (5 MG/ML) 0.5% IN NEBU: 2.5000 mg | INHALATION_SOLUTION | RESPIRATORY_TRACT | Status: DC | PRN

## 2012-08-27 MED ORDER — ACETAMINOPHEN 325 MG PO TABS: 650.0000 mg | ORAL_TABLET | Freq: Four times a day (QID) | ORAL | Status: DC | PRN

## 2012-08-27 MED ORDER — ONDANSETRON HCL 4 MG/2ML IJ SOLN: 4.0000 mg | Freq: Four times a day (QID) | INTRAMUSCULAR | Status: DC | PRN

## 2012-08-27 MED ORDER — DEXTROSE 5 % IV SOLN
1.0000 g | Freq: Every day | INTRAVENOUS | Status: DC
  Administered 2012-08-27: 1 g via INTRAVENOUS
  Filled 2012-08-27: qty 10

## 2012-08-27 MED ORDER — B COMPLEX-C PO TABS
1.0000 | ORAL_TABLET | Freq: Every day | ORAL | Status: DC
  Administered 2012-08-28 – 2012-08-29 (×2): 1 via ORAL
  Filled 2012-08-27 (×2): qty 1

## 2012-08-27 MED ORDER — ASPIRIN EC 81 MG PO TBEC
81.0000 mg | DELAYED_RELEASE_TABLET | Freq: Every morning | ORAL | Status: DC
  Administered 2012-08-28 – 2012-08-29 (×2): 81 mg via ORAL
  Filled 2012-08-27 (×2): qty 1

## 2012-08-27 MED ORDER — PRIMIDONE 50 MG PO TABS
50.0000 mg | ORAL_TABLET | Freq: Every day | ORAL | Status: DC
  Administered 2012-08-27 – 2012-08-28 (×2): 50 mg via ORAL
  Filled 2012-08-27 (×3): qty 1

## 2012-08-27 MED ORDER — ONDANSETRON HCL 4 MG PO TABS: 4.0000 mg | ORAL_TABLET | Freq: Four times a day (QID) | ORAL | Status: DC | PRN

## 2012-08-27 MED ORDER — CLONAZEPAM 1 MG PO TABS
2.0000 mg | ORAL_TABLET | Freq: Two times a day (BID) | ORAL | Status: DC
  Administered 2012-08-27 – 2012-08-29 (×4): 2 mg via ORAL
  Filled 2012-08-27 (×4): qty 2

## 2012-08-27 MED ORDER — ACETAMINOPHEN 650 MG RE SUPP: 650.0000 mg | Freq: Four times a day (QID) | RECTAL | Status: DC | PRN

## 2012-08-27 MED ORDER — OMEGA-3 FATTY ACIDS 1000 MG PO CAPS: 1.0000 g | ORAL_CAPSULE | Freq: Every day | ORAL | Status: DC

## 2012-08-27 MED ORDER — OMEGA-3-ACID ETHYL ESTERS 1 G PO CAPS
1.0000 g | ORAL_CAPSULE | Freq: Every day | ORAL | Status: DC
  Administered 2012-08-27 – 2012-08-29 (×3): 1 g via ORAL
  Filled 2012-08-27 (×3): qty 1

## 2012-08-27 MED ORDER — MONTELUKAST SODIUM 10 MG PO TABS
10.0000 mg | ORAL_TABLET | Freq: Every day | ORAL | Status: DC
  Administered 2012-08-27 – 2012-08-28 (×2): 10 mg via ORAL
  Filled 2012-08-27 (×3): qty 1

## 2012-08-27 MED ORDER — RISAQUAD PO CAPS
1.0000 | ORAL_CAPSULE | Freq: Every day | ORAL | Status: DC
  Administered 2012-08-28 – 2012-08-29 (×2): 1 via ORAL
  Filled 2012-08-27 (×2): qty 1

## 2012-08-27 MED ORDER — AZITHROMYCIN 500 MG IV SOLR
500.0000 mg | INTRAVENOUS | Status: DC
  Administered 2012-08-27: 500 mg via INTRAVENOUS
  Filled 2012-08-27: qty 500

## 2012-08-27 MED ORDER — FENOFIBRATE 160 MG PO TABS
160.0000 mg | ORAL_TABLET | Freq: Every day | ORAL | Status: DC
  Administered 2012-08-28 – 2012-08-29 (×2): 160 mg via ORAL
  Filled 2012-08-27 (×2): qty 1

## 2012-08-27 MED ORDER — SODIUM CHLORIDE 0.9 % IJ SOLN
3.0000 mL | Freq: Two times a day (BID) | INTRAMUSCULAR | Status: DC
  Administered 2012-08-27 – 2012-08-29 (×3): 3 mL via INTRAVENOUS

## 2012-08-27 NOTE — Progress Notes (Signed)
Subjective:    Patient ID: Roy [name], male    DOB: [birth year], 78 y.o.   MRN: [id number]  HPI  Pt presents to the clinic today to f/u ER visit for cough and SOB. He was given Levaquin for a possible pneumonia. He feels like he is much worse. He is much more fatigued and having increased work of breathing. He does have a history of a RLL cancer for which he is undergoing radiation. He is on oxygen at home where his sats are maintained at 91-93%.  Review of Systems     Past Medical History  Diagnosis Date  . Anxiety   . Asthma   . Depression   . Kidney stones     hx of   . Pulmonary fibrosis   . Hx of radiation therapy may 2010 thru june 2010    5 tx, stereotactic radiotherapy  . Tremor, essential   . COPD (chronic obstructive pulmonary disease)   . DM (diabetes mellitus)   . Normocytic anemia   . Diverticulosis   . Allergic rhinitis   . Hyperlipidemia   . Adenomatous colon polyp   . Chronic left shoulder pain   . Chronic constipation   . Hemorrhoids   . Cellulitis of leg, right   . Edema   . Radiation May/June 2010    stereotactic body radiotherapy  . Urethral stricture 04/18/2012    Dr mcdermot/urology  . GERD (gastroesophageal reflux disease)   . Hx of radiation therapy 05/08/12 -05/21/12    RLL lung  . Zenker's diverticulum   . Prostate cancer 2005    gleason 4+4=8  . Lung cancer 2010, 2013    RLL, 2013 recurrent     Current Outpatient Prescriptions  Medication Sig Dispense Refill  . AMBULATORY NON FORMULARY MEDICATION Oxygen At bedtime as directed         . aspirin EC 81 MG tablet Take 81 mg by mouth every morning.       Marland Kitchen b complex vitamins capsule Take 1 capsule by mouth daily.      . beclomethasone (QVAR) 80 MCG/ACT inhaler Inhale 1 puff into the lungs 2 (two) times daily.      . Cholecalciferol (VITAMIN D3 SUPER STRENGTH) 2000 UNITS TABS Take 1 tablet by mouth daily.      . clonazePAM (KLONOPIN) 2 MG tablet Take 2 mg by mouth 2 (two) times daily.         . fenofibrate (TRICOR) 145 MG tablet Take 1 tablet (145 mg total) by mouth daily.  90 tablet  3  . fish oil-omega-3 fatty acids 1000 MG capsule Take by mouth daily.       Marland Kitchen levofloxacin (LEVAQUIN) 500 MG tablet Take 1 tablet (500 mg total) by mouth daily.  7 tablet  0  . montelukast (SINGULAIR) 10 MG tablet Take 10 mg by mouth at bedtime.      . primidone (MYSOLINE) 50 MG tablet Take 50 mg by mouth at bedtime.      . Probiotic Product (ALIGN) 4 MG CAPS Take 1 capsule by mouth daily.      Marland Kitchen tiotropium (SPIRIVA) 18 MCG inhalation capsule Place 18 mcg into inhaler and inhale daily.        Marland Kitchen triamcinolone (NASACORT) 55 MCG/ACT nasal inhaler Place 2 sprays into the nose as needed. For allergies      . [DISCONTINUED] DICYCLOMINE HCL PO Take 1 tablet by mouth daily.         Allergies  Allergen  Reactions  . Iodine Nausea Only  . Sulfonamide Derivatives Nausea And Vomiting    Family History  Problem Relation Age of Onset  . Prostate cancer Brother   . Colon cancer Sister   . Bladder Cancer Father   . Lung cancer Brother   . Heart disease Brother     History   Social History  . Marital Status: Widowed    Spouse Name: N/A    Number of Children: 1  . Years of Education: N/A   Occupational History  . CPA     Retired   . retired     Company secretary   Social History Main Topics  . Smoking status: Former Smoker -- 1.0 packs/day for 35 years    Quit date: 08/14/1989  . Smokeless tobacco: Never Used  . Alcohol Use: No     Comment: none in 10 years  . Drug Use: No  . Sexually Active: Not on file   Other Topics Concern  . Not on file   Social History Narrative   Daily caffeine      Constitutional: Pt reports fatigue. Denies fever, malaise, fatigue, headache or abrupt weight changes.  HEENT: Denies eye pain, eye redness, ear pain, ringing in the ears, wax buildup, runny nose, nasal congestion, bloody nose, or sore throat. Respiratory: Pt reports SOB and wheezing.   Cardiovascular:  Denies chest pain, chest tightness, palpitations or swelling in the hands or feet.    No other specific complaints in a complete review of systems (except as listed in HPI above).  Objective:   Physical Exam   BP 130/62  Pulse 75  Temp 97.7 F (36.5 C) (Oral)  SpO2 85% Wt Readings from Last 3 Encounters:  08/23/12 147 lb (66.679 kg)  07/10/12 150 lb 9.6 oz (68.312 kg)  06/27/12 142 lb 9.6 oz (64.683 kg)    General: Appears his stated age, well developed, well nourished in some acute distress. Cardiovascular: Tachycardic.S1,S2 noted.  No murmur, rubs or gallops noted. No JVD or BLE edema. No carotid bruits noted. Pulmonary/Chest: Moderate DAR. Sats 86 on 3L [place]. Coarse crackles throughout   EKG:  BMET    Component Value Date/Time   NA 140 08/23/2012 2115   K 4.2 08/23/2012 2115   CL 98 08/23/2012 2115   CO2 32 08/23/2012 2115   GLUCOSE 190* 08/23/2012 2115   BUN 24* 08/23/2012 2115   CREATININE 1.10 08/23/2012 2115   CALCIUM 9.2 08/23/2012 2115   GFRNONAA 57* 08/23/2012 2115   GFRAA 67* 08/23/2012 2115    Lipid Panel  No results found for this basename: chol, trig, hdl, cholhdl, vldl, ldlcalc    CBC    Component Value Date/Time   WBC 5.9 08/23/2012 2115   RBC 4.51 08/23/2012 2115   HGB 12.2* 08/23/2012 2115   HCT 38.8* 08/23/2012 2115   PLT 97* 08/23/2012 2115   MCV 86.0 08/23/2012 2115   MCH 27.1 08/23/2012 2115   MCHC 31.4 08/23/2012 2115   RDW 16.8* 08/23/2012 2115   LYMPHSABS 0.4* 08/23/2012 2115   MONOABS 0.3 08/23/2012 2115   EOSABS 0.1 08/23/2012 2115   BASOSABS 0.0 08/23/2012 2115    Hgb A1C No results found for this basename: HGBA1C       Assessment & Plan:   Shortness of breath, likely due to fluid in/around the lung  Breathing treatment in office today Pt's daughter will take pt to [facility] ED  RTC after discharge

## 2012-08-27 NOTE — Telephone Encounter (Signed)
Spoke with pt's daughter.  He apparently started the primidone, but was a bit groggy on and then had been started on prednisone and stopped the primidone after just a few days.  He is now going to try it again to see if it is helpful.  He will let us know how it does.

## 2012-08-27 NOTE — H&P (Signed)
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 Broadus John care), COPD on home oxygen who was sent from PCP office for shortness of breath and hypoxia. Patient was apparently given Levaquin for possible pneumonia and has gotten worse. Patient was directly admitted to telemetry for further evaluation and management. On the floor, patient's respiratory status has stabilized and he appeared more comfortable. He is a fair historian, not reporting a detailed history part of it due to hearing difficulty. Patient denied shortness of breath at the time of interview, no chest pain, no palpitations. No abdominal pain, no nausea or vomiting. No fever or chills.  Assessment and Plan:  Principal Problem:  *Respiratory failure with hypoxia  Likely secondary to combination of non-small cell lung cancer, postobstructive pneumonia, radiation pneumonitis and possible COPD but cannot exclude the possibility of PE; Due to contrast allergies we will proceed with the V/Q scan  Currently saturating 95% on 2 L nasal canula  Continue nebulizer treatments Q 2 hours PRN  No admission labs or imaging studies are available as this was a direct admission so we will order CXR and CBC, CMP. Until these results are available we will place patient on empiric antibiotics: azithromycin and ceftriaxone  Active Problems:  NEOPLASM, MALIGNANT, LUNG, NONSMALL CELL   per radiation oncology  Hyperlipidemia  Continue fenofibrate Anemia of chronic disease  Secondary to malignancy  Follow up admission labs   Code Status: Full Family Communication: Pt at bedside Disposition Plan: Direct admission  Manson Passey, MD  [facility] Pager  [phone]  If 7PM-7AM, please contact night-coverage [email or web address] Password [email or web address] 08/27/2012, 6:07 PM   Review of Systems:  Constitutional: Negative for fever, chills and malaise/fatigue. Negative for diaphoresis.  HENT: Negative for hearing loss, ear pain, nosebleeds, congestion, sore throat, neck pain, tinnitus and ear discharge.   Eyes: Negative for blurred vision, double vision, photophobia, pain, discharge and redness.  Respiratory: per HPI.   Cardiovascular: Negative for chest pain, palpitations, orthopnea, claudication and leg swelling.  Gastrointestinal: Negative for nausea, vomiting and abdominal pain. Negative for heartburn, constipation, blood in stool and melena.  Genitourinary: Negative for dysuria, urgency, frequency, hematuria and flank pain.  Musculoskeletal: Negative for myalgias, back pain, joint pain and falls.  Skin: Negative for itching and rash.  Neurological: Negative for dizziness and weakness. Negative for tingling, tremors, sensory change, speech change, focal weakness, loss of consciousness and headaches.  Endo/Heme/Allergies: Negative for environmental allergies and polydipsia. Does not bruise/bleed easily.  Psychiatric/Behavioral: Negative for suicidal ideas. The patient is not nervous/anxious.      Past Medical History  Diagnosis Date  . Anxiety   . Asthma   . Depression   . Kidney stones     hx of   . Pulmonary fibrosis   . Hx of radiation therapy may 2010 thru june 2010    5 tx, stereotactic radiotherapy  . Tremor, essential   . COPD (chronic obstructive pulmonary disease)   . DM (diabetes mellitus)   . Normocytic anemia   . Diverticulosis   . Allergic rhinitis   . Hyperlipidemia   . Adenomatous colon polyp   . Chronic left shoulder pain   . Chronic constipation   . Hemorrhoids   . Cellulitis of  leg, right   . Edema   . Radiation May/June 2010    stereotactic body radiotherapy  . Urethral stricture 04/18/2012    Dr mcdermot/urology  .  GERD (gastroesophageal reflux disease)   . Hx of radiation therapy 05/08/12 -05/21/12    RLL lung  . Zenker's diverticulum   . Prostate cancer 2005    gleason 4+4=8  . Lung cancer 2010, 2013    RLL, 2013 recurrent    Past Surgical History  Procedure Date  . Appendectomy   . Hdr brachytherapy implant for prostate cancer 2005  . Cataract extraction, bilateral   . Internal urethrotomy 12/09/2008  . Colonoscopy October 2008    Diverticulosis, hemorrhoids  . Tonsillectomy 1943  . Ct biopsy 04/11/2012    invasive squamous cell carcinoma   Social History:  reports that he quit smoking about 23 years ago. He has never used smokeless tobacco. He reports that he does not drink alcohol or use illicit drugs.  Allergies  Allergen Reactions  . Iodine Nausea Only  . Sulfonamide Derivatives Nausea And Vomiting    Family History:  Family History  Problem Relation Age of Onset  . Prostate cancer Brother   . Colon cancer Sister   . Bladder Cancer Father   . Lung cancer Brother   . Heart disease Brother      Prior to Admission medications   Medication Sig Start Date End Date Taking? Authorizing Provider  AMBULATORY NON FORMULARY MEDICATION Oxygen At bedtime as directed      Yes Historical Provider, MD  aspirin EC 81 MG tablet Take 81 mg by mouth every morning.    Yes Historical Provider, MD  b complex vitamins capsule Take 1 capsule by mouth daily.   Yes Historical Provider, MD  clonazePAM (KLONOPIN) 2 MG tablet Take 2 mg by mouth 2 (two) times daily.    Yes Historical Provider, MD  fenofibrate (TRICOR) 145 MG tablet Take 1 tablet (145 mg total) by mouth daily. 07/29/12  Yes Corwin Levins, MD  fish oil-omega-3 fatty acids 1000 MG capsule Take by mouth daily.    Yes Historical Provider, MD  levofloxacin (LEVAQUIN) 500 MG tablet Take 1 tablet (500 mg total) by mouth daily. 08/23/12  Yes Gwyneth Sprout, MD  montelukast (SINGULAIR) 10 MG tablet Take 10 mg by mouth at bedtime.   Yes Historical  Provider, MD  primidone (MYSOLINE) 50 MG tablet Take 50 mg by mouth at bedtime. 06/11/12  Yes Rebecca S Tat, DO  Probiotic Product (ALIGN) 4 MG CAPS Take 1 capsule by mouth daily.   Yes Historical Provider, MD  tiotropium (SPIRIVA) 18 MCG inhalation capsule Place 18 mcg into inhaler and inhale daily.     Yes Historical Provider, MD  triamcinolone (NASACORT) 55 MCG/ACT nasal inhaler Place 2 sprays into the nose as needed. For allergies   Yes Historical Provider, MD   Physical Exam: Filed Vitals:   08/27/12 1700  BP: 118/73  Pulse: 109  Temp: 97.5 F (36.4 C)  TempSrc: Oral  Resp: 20  Height: 5\' 6"  (1.676 m)  Weight: 63.005 kg (138 lb 14.4 oz)  SpO2: 95%    Physical Exam  Constitutional: Appears malnurished. No distress.  HENT: Normocephalic. Dry mucus membranes  Eyes: Conjunctivae and EOM are normal. PERRLA, no scleral icterus.  Neck: Normal ROM. Neck supple. No JVD. No tracheal deviation. No thyromegaly.  CVS: RRR, S1/S2  appreciated.  Pulmonary: wheezing over upper lung lobes  Abdominal: Soft. BS +,  no distension, tenderness,  rebound or guarding.  Musculoskeletal: Normal range of motion. No edema and no tenderness.  Lymphadenopathy: No lymphadenopathy noted, cervical, inguinal. Neuro: Alert. Normal reflexes, muscle tone coordination. No cranial nerve deficit. Skin: Skin is warm and dry. No rash noted. Not diaphoretic. No erythema. No pallor.  Psychiatric: Normal mood and affect. Behavior, judgment, thought content normal.   Labs on Admission:  Basic Metabolic Panel:  Lab 08/23/12 1610  NA 140  K 4.2  CL 98  CO2 32  GLUCOSE 190*  BUN 24*  CREATININE 1.10  CALCIUM 9.2  MG --  PHOS --   CBC:  Lab 08/23/12 2115  WBC 5.9  NEUTROABS 5.1  HGB 12.2*  HCT 38.8*  MCV 86.0  PLT 97*   Cardiac Enzymes: No results found for this basename: CKTOTAL:5,CKMB:5,CKMBINDEX:5,TROPONINI:5 in the last 168 hours BNP: No components found with this basename: POCBNP:5 CBG: No  results found for this basename: GLUCAP:5 in the last 168 hours  Radiological Exams on Admission: No results found.  EKG: Normal sinus rhythm, no ST/T wave changes  Time spent 100 minutes

## 2012-08-27 NOTE — Progress Notes (Signed)
Called by [name] from [facility] office Patient needs admission for hypoxic respiratory failure and atchycardia R/o PE R/o vol overload R/o pleural efussion R/o pneumonia Roy [name]

## 2012-08-28 DIAGNOSIS — C3490 Malignant neoplasm of unspecified part of unspecified bronchus or lung: Secondary | ICD-10-CM

## 2012-08-28 DIAGNOSIS — E785 Hyperlipidemia, unspecified: Secondary | ICD-10-CM

## 2012-08-28 DIAGNOSIS — D649 Anemia, unspecified: Secondary | ICD-10-CM

## 2012-08-28 DIAGNOSIS — J8410 Pulmonary fibrosis, unspecified: Secondary | ICD-10-CM

## 2012-08-28 LAB — CBC
MCH: 26.8 pg (ref 26.0–34.0)
MCV: 86.2 fL (ref 78.0–100.0)
Platelets: DECREASED 10*3/uL (ref 150–400)
RBC: 4.07 MIL/uL — ABNORMAL LOW (ref 4.22–5.81)

## 2012-08-28 LAB — COMPREHENSIVE METABOLIC PANEL
AST: 21 U/L (ref 0–37)
Albumin: 2.8 g/dL — ABNORMAL LOW (ref 3.5–5.2)
Alkaline Phosphatase: 56 U/L (ref 39–117)
Chloride: 101 mEq/L (ref 96–112)
Creatinine, Ser: 1.24 mg/dL (ref 0.50–1.35)
Potassium: 4 mEq/L (ref 3.5–5.1)
Total Bilirubin: 0.3 mg/dL (ref 0.3–1.2)

## 2012-08-28 LAB — GLUCOSE, CAPILLARY: Glucose-Capillary: 94 mg/dL (ref 70–99)

## 2012-08-28 LAB — EXPECTORATED SPUTUM ASSESSMENT W GRAM STAIN, RFLX TO RESP C

## 2012-08-28 LAB — STREP PNEUMONIAE URINARY ANTIGEN: Strep Pneumo Urinary Antigen: NEGATIVE

## 2012-08-28 MED ORDER — PREDNISONE 50 MG PO TABS
50.0000 mg | ORAL_TABLET | Freq: Every day | ORAL | Status: DC
  Administered 2012-08-29: 50 mg via ORAL
  Filled 2012-08-28 (×2): qty 1

## 2012-08-28 MED ORDER — PANTOPRAZOLE SODIUM 40 MG PO TBEC
40.0000 mg | DELAYED_RELEASE_TABLET | Freq: Every day | ORAL | Status: DC
  Administered 2012-08-29: 40 mg via ORAL
  Filled 2012-08-28: qty 1

## 2012-08-28 MED ORDER — LEVOFLOXACIN 500 MG PO TABS
500.0000 mg | ORAL_TABLET | Freq: Every day | ORAL | Status: DC
  Administered 2012-08-29: 500 mg via ORAL
  Filled 2012-08-28 (×2): qty 1

## 2012-08-28 NOTE — Progress Notes (Signed)
[facility] PROGRESS NOTE  Roy [name] WGN:[id number] DOB: 02-15-1921 DOA: 08/27/2012 PCP: Oliver Barre, MD  Assessment/Plan: 1-Acute on chronic resp failure and SOB: secondary to interstitial fibrosis, mild exacerbation of COPD and radiation pneumonitis. Mild bronchitic to bronchiectasis also present. Will transition abx's to PO; start prednisone tapering regimen and continue oxygen support. Flu by PCR is negative. Most likely home tomorrow. CE'z neg X 3, V/Q scan w/o PE. Will continue spiriva.  2-Small cell carcinoma: per radiation oncology.  3-HLD: continue fenofibrate  4-Anemia of chronic disease: stable. Hgb at 10.9 no transfusion needed.  5-Essential tremors: continue primidone  6-allergic rhinitis: continue flonase and montelukast  DVT: scd's.  Code Status: Full Family Communication: no family at bedside Disposition Plan: most likely home tomorrow. Transition treatment to PO; start steroids tapering   Consultants:  none  Procedures:  V/Q scan (Neg for PE)  CT chest and CXR (see below for reports)  Antibiotics:  12/31 zithromax and rocephin  08/28/12 levaquin  HPI/Subjective: Afebrile; still with some cough; no CP.  Objective: Filed Vitals:   08/27/12 1700 08/27/12 2204 08/28/12 0440 08/28/12 0500  BP: 118/73 106/57 102/55   Pulse: 109 96 89   Temp: 97.5 F (36.4 C) 98.4 F (36.9 C) 98.3 F (36.8 C)   TempSrc: Oral Oral Oral   Resp: 20 22 20    Height: 5\' 6"  (1.676 m)     Weight: 63.005 kg (138 lb 14.4 oz)   64.4 kg (141 lb 15.6 oz)  SpO2: 95% 93% 94%     Intake/Output Summary (Last 24 hours) at 08/28/12 1023 Last data filed at 08/28/12 0610  Gross per 24 hour  Intake 1137.5 ml  Output    400 ml  Net  737.5 ml   Filed Weights   08/27/12 1700 08/28/12 0500  Weight: 63.005 kg (138 lb 14.4 oz) 64.4 kg (141 lb 15.6 oz)    Exam:   General:  Breathing better, no CP, afebrile; still with some cough.  Cardiovascular: S1 and S2; no rubs or  gallops  Respiratory: exp wheezing and positive Velcro fibrotic sounds with inspiration; no crackles  Abdomen: soft, NT, ND, positive BS  Neuro: non focal  Data Reviewed: Basic Metabolic Panel:  Lab 08/28/12 7846 08/27/12 1855 08/23/12 2115  NA 139 143 140  K 4.0 4.5 4.2  CL 101 103 98  CO2 31 33* 32  GLUCOSE 96 101* 190*  BUN 28* 30* 24*  CREATININE 1.24 1.18 1.10  CALCIUM 8.9 9.8 9.2  MG -- 1.7 --  PHOS -- 3.3 --   Liver Function Tests:  Lab 08/28/12 0500 08/27/12 1855  AST 21 34  ALT 12 16  ALKPHOS 56 65  BILITOT 0.3 0.3  PROT 7.0 7.9  ALBUMIN 2.8* 3.2*   CBC:  Lab 08/28/12 0500 08/27/12 1855 08/23/12 2115  WBC 5.3 6.4 5.9  NEUTROABS -- 5.1 5.1  HGB 10.9* 12.4* 12.2*  HCT 35.1* 38.5* 38.8*  MCV 86.2 82.8 86.0  PLT PLATELET CLUMPS NOTED ON SMEAR, COUNT APPEARS DECREASED PLATELET CLUMPS NOTED ON SMEAR, COUNT APPEARS DECREASED 97*   BNP (last 3 results)  Basename 10/07/11 1515  PROBNP 183.9   CBG:  Lab 08/28/12 0731  GLUCAP 94    Recent Results (from the past 240 hour(s))  RAPID STREP SCREEN     Status: Normal   Collection Time   08/23/12 10:10 PM      Component Value Range Status Comment   Streptococcus, Group A Screen (Direct) NEGATIVE  NEGATIVE Final   CULTURE, EXPECTORATED SPUTUM-ASSESSMENT     Status: Normal   Collection Time   08/28/12  6:26 AM      Component Value Range Status Comment   Specimen Description SPUTUM   Final    Special Requests Immunocompromised   Final    Sputum evaluation     Final    Value: THIS SPECIMEN IS ACCEPTABLE. RESPIRATORY CULTURE REPORT TO FOLLOW.   Report Status 08/28/2012 FINAL   Final      Studies: Dg Chest Port 1 View  08/27/2012  *RADIOLOGY REPORT*  Clinical Data: Short of breath  PORTABLE CHEST - 1 VIEW  Comparison: Chest radiograph 08/23/2012  Findings: Normal cardiac silhouette.  There is a dense right perihilar opacity again demonstrated.  There this represents a hypermetabolic mass on comparison PET CT  scan of the 03/25/2012. There is a post obstructive pneumonitis in the right lower lobe which is similar to prior.  There is underlying chronic interstitial lung disease which is not changed from prior.  No pneumothorax.  IMPRESSION:  1.  No significant interval change. 2.  Right perihilar mass consistent with bronchogenic carcinoma. 3.  Mild postobstructive pneumonitis in the right lung. 4.  Chronic interstitial lung disease.   Original Report Authenticated By: Genevive Bi, M.D.     Scheduled Meds:   . acidophilus  1 capsule Oral Daily  . aspirin EC  81 mg Oral q morning - 10a  . B-complex with vitamin C  1 tablet Oral Daily  . clonazePAM  2 mg Oral BID  . fenofibrate  160 mg Oral Daily  . fluticasone  2 spray Each Nare Daily  . levofloxacin  500 mg Oral Daily  . montelukast  10 mg Oral QHS  . omega-3 acid ethyl esters  1 g Oral Daily  . pantoprazole  40 mg Oral Daily  . predniSONE  50 mg Oral Q breakfast  . primidone  50 mg Oral QHS  . sodium chloride  3 mL Intravenous Q12H  . tiotropium  18 mcg Inhalation Daily   Continuous Infusions:   . sodium chloride 75 mL/hr at 08/28/12 0610    Principal Problem:  *Respiratory failure with hypoxia Active Problems:  NEOPLASM, MALIGNANT, LUNG, NONSMALL CELL  COPD  INTERSTITIAL LUNG DISEASE  Prostate cancer  Constipation  Hyperlipidemia  DM (diabetes mellitus)  Normocytic anemia    Time spent: >30 minutes    Roy [name]  [facility] Pager [phone]. If 8PM-8AM, please contact night-coverage at [email or web address], password [email or web address] 08/28/2012, 10:23 AM  LOS: 1 day

## 2012-08-29 ENCOUNTER — Inpatient Hospital Stay (HOSPITAL_COMMUNITY): Payer: Medicare Other

## 2012-08-29 ENCOUNTER — Telehealth: Payer: Self-pay

## 2012-08-29 ENCOUNTER — Encounter (HOSPITAL_COMMUNITY): Payer: Self-pay | Admitting: Radiology

## 2012-08-29 DIAGNOSIS — R5381 Other malaise: Secondary | ICD-10-CM

## 2012-08-29 DIAGNOSIS — Z0289 Encounter for other administrative examinations: Secondary | ICD-10-CM

## 2012-08-29 LAB — LEGIONELLA ANTIGEN, URINE: Legionella Antigen, Urine: NEGATIVE

## 2012-08-29 MED ORDER — TECHNETIUM TC 99M DIETHYLENETRIAME-PENTAACETIC ACID: 41.4000 | Freq: Once | INTRAVENOUS | Status: DC | PRN

## 2012-08-29 MED ORDER — LEVOFLOXACIN 250 MG PO TABS: 250.0000 mg | ORAL_TABLET | Freq: Every day | ORAL | Status: DC

## 2012-08-29 MED ORDER — TECHNETIUM TO 99M ALBUMIN AGGREGATED
5.0000 | Freq: Once | INTRAVENOUS | Status: AC | PRN
  Administered 2012-08-29: 5 via INTRAVENOUS

## 2012-08-29 MED ORDER — PANTOPRAZOLE SODIUM 40 MG PO TBEC: 40.0000 mg | DELAYED_RELEASE_TABLET | Freq: Every day | ORAL | Status: DC

## 2012-08-29 MED ORDER — PREDNISONE 20 MG PO TABS: ORAL_TABLET | ORAL | Status: DC

## 2012-08-29 MED ORDER — LEVOFLOXACIN 250 MG PO TABS: 250.0000 mg | ORAL_TABLET | Freq: Every day | ORAL | Status: AC

## 2012-08-29 NOTE — Progress Notes (Signed)
RT gave PT Spiriva at 0805- Sp02 on 3 lpm [place]= 92, BS diminished bilat., HR 100- RN aware RT meds are Read Only.

## 2012-08-29 NOTE — Telephone Encounter (Signed)
Message copied by Pincus Sanes on Thu Aug 29, 2012  4:27 PM ------      Message from: Corwin Levins      Created: Thu Aug 29, 2012 12:26 PM      Regarding: hosp f/u       Zella Ball to contact pt within 2 business days post d/c for Transitional Care purpose, please            Pt need ROV at 2 wks

## 2012-08-29 NOTE — Telephone Encounter (Signed)
Called left message to call back 

## 2012-08-29 NOTE — Care Management Note (Signed)
    Page 1 of 1   08/29/2012     12:26:35 PM   CARE MANAGEMENT NOTE 08/29/2012  Patient:  Roy [name],Roy [name]   Account Number:  [id number]  Date Initiated:  08/29/2012  Documentation initiated by:  [facility]  Subjective/Objective Assessment:   ADMITTED W/SOB.RESP FAILURE.VW:UJWJ CA.     Action/Plan:   FROM HOME W/DTR.HAS HOME 02-[facility].HAS PCP.   Anticipated DC Date:  08/29/2012   Anticipated DC Plan:  HOME W HOME HEALTH SERVICES         Choice offered to / List presented to:  C-1 Patient        HH arranged  HH-1 RN  HH-2 PT  HH-3 OT      [facility] agency  [facility]   Status of service:  Completed, signed off Medicare Important Message given?  NA - LOS <3 / Initial given by admissions (If response is "NO", the following Medicare IM given date fields will be blank) Date Medicare IM given:   Date Additional Medicare IM given:    Discharge Disposition:  HOME W HOME HEALTH SERVICES  Per UR Regulation:  Reviewed for med. necessity/level of care/duration of stay  If discussed at Long Length of Stay Meetings, dates discussed:    Comments:  08/29/12 Bruin Bolger RN,BSN NCM [phone] PT/OT-SNF.PATIENT PLEASANTLY DECLINED SNF.[facility] CHOSEN FOR HH,CONTACTED SUSAN(LIASON) INFORMED OF D/C HOME [facility] ORDERS,& F2F.INFORMED [facility] OF DME HOME 02 CHANGE TO 3LNC CONTINUOUS.PATIENT HAS HOME 02 TRAVEL TANK.PROVIDED W/MEALS ON WHEELS INFO,& TEL#.MD UPDATED.

## 2012-08-29 NOTE — Discharge Summary (Signed)
Physician Discharge Summary  Roy [name] XBJ:[id number] DOB: 06/07/1921 DOA: 08/27/2012  PCP: Oliver Barre, MD  Admit date: 08/27/2012 Discharge date: 08/29/2012  Time spent: >30 minutes minutes  Recommendations for Outpatient Follow-up:  -Patient will follow with PCP in 2 weeks (reevaluate breathing; oxygen supplementation changed to 24/7 continuous; and will continue PO antibiotics and tapering steroids). -Repeat CBC to follow Hgb and also BMET to check electrolytes and renal function.  Discharge Diagnoses:  Principal Problem:  *Respiratory failure with hypoxia Active Problems:  NEOPLASM, MALIGNANT, LUNG, NONSMALL CELL  COPD  INTERSTITIAL LUNG DISEASE  Prostate cancer  Constipation  Hyperlipidemia  DM (diabetes mellitus)  Normocytic anemia   Discharge Condition: stable and improved. Will be discharge home with [facility] services. Patient advised tot ake medications as prescribed and to follow discharge instructions.   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 Broadus John care), COPD on home oxygen who was sent from PCP office for shortness of breath and hypoxia. Patient was apparently given Levaquin for possible pneumonia and has gotten worse. Patient was directly admitted to telemetry for further evaluation and management. On the floor, patient's respiratory status has stabilized and he appeared more comfortable. He is a fair historian, not reporting a detailed history part of it due to hearing difficulty. Patient denied shortness of breath at the time of interview, no chest pain, no palpitations. No abdominal pain, no nausea or vomiting. No fever or chills.   Hospital Course:  1-Acute on chronic resp failure and SOB:  secondary to interstitial fibrosis, mild exacerbation of COPD and radiation pneumonitis. Mild bronchitic to bronchiectasis also present. Will discharge on antibiotics by mouth, tapering prednisone and will adjust oxygen supplementation. Patient will follow with oncologist as an outpatient and with PCP in about 2 weeks. Flu by PCR is negative. . CE'z neg X 3, V/Q scan w/o PE. Will continue spiriva.   2-Small cell carcinoma: per radiation oncology. CXR demonstrating increase in mass size and also radiation pneumonitis changes.  3-HLD: continue fenofibrate   4-Anemia of chronic disease: stable. Hgb at 10.9 no transfusion needed.   5-Essential tremors: continue primidone   6-allergic rhinitis: continue flonase and montelukast  7-HLD: continue current medications  8-Physical deconditioning: will start [facility] care to improve conditioning and strength   Rest of medical problems remains stable and the plan is to continue current medication regimen.  Procedures:  CXR and V/Q scan (see below for results)   Discharge Exam: Filed Vitals:   08/28/12 1440 08/28/12 2145 08/29/12 0500 08/29/12 0526  BP: 114/52 113/57  143/74  Pulse: 88 91  115  Temp: 99.3 F (37.4 C) 97.9 F (36.6 C)  97.6 F (36.4 C)  TempSrc: Oral Oral  Oral  Resp: 20 20  20   Height:      Weight:   63.8 kg (140 lb 10.5 oz)   SpO2: 95% 98%  92%   General: Breathing better, no CP, afebrile; still with mild cough.  Cardiovascular: S1 and S2; no rubs or gallops  Respiratory: mild exp wheezing and positive Velcro fibrotic sounds with inspiration; no crackles  Abdomen: soft, NT, ND, positive BS  Neuro: non focal   Discharge Instructions  Discharge  Orders    Future Appointments: Provider: Department: Dept Phone: Center:   09/06/2012 2:00 PM Corwin Levins, MD [facility] [phone] [facility]   09/26/2012 1:00 PM [facility] [phone]    10/03/2012  11:45 AM Oneita Hurt, MD  [facility] [phone] None     Future Orders Please Complete By Expires   For home use only DME oxygen      Questions: Responses:   Mode or (Route) Nasal cannula   Liters per Minute 3   Frequency Continuous   Oxygen conserving device Yes   Home Health      Questions: Responses:   To provide the following care/treatments PT    OT    RN    Home Health Aide   Face-to-face encounter      Comments:   I Laddie Naeem certify that this patient is under my care and that I, or a nurse practitioner or physician's assistant working with me, had a face-to-face encounter that meets the physician face-to-face encounter requirements with this patient on 08/29/2012. The encounter with the patient was in whole, or in part for the following medical condition(s) which is the primary reason for home health care (List medical condition): Acute on chronic resp failure and deconditioning secondary to lung cancer and acute post/obstructive pneumonitis.   Questions: Responses:   The encounter with the patient was in whole, or in part, for the following medical condition, which is the primary reason for home health care deconditioning due to chronic illness; lung cancer and ongoing radiation   I certify that, based on my findings, the following services are medically necessary home health services Nursing    Physical therapy   My clinical findings support the need for the above services Leaving home exacerbates symptoms (pain, dyspnea, anxiety etc.)   Further, I certify that my clinical findings support that this patient is homebound due to: Leaving home exacerbates symptoms (dyspnea, pain, anxiety, etc)    Ambulates short distances less than 300 feet    Unable to leave home safely without assistance   To provide the following care/treatments PT    OT    RN    Home Health Aide   Discharge instructions      Comments:   -Take medications as  prescribed -Keep yourself well hydrated -Oxygen to be use 24/7 3L now. -Arrange follow up with PCP 2 weeks       Medication List     As of 08/29/2012 12:00 PM    STOP taking these medications         AMBULATORY NON FORMULARY MEDICATION      TAKE these medications         ALIGN 4 MG Caps   Take 1 capsule by mouth daily.      aspirin EC 81 MG tablet   Take 81 mg by mouth every morning.      b complex vitamins capsule   Take 1 capsule by mouth daily.      clonazePAM 2 MG tablet   Commonly known as: KLONOPIN   Take 2 mg by mouth 2 (two) times daily.      fenofibrate 145 MG tablet   Commonly known as: TRICOR   Take 1 tablet (145 mg total) by mouth daily.      fish oil-omega-3 fatty acids 1000 MG capsule   Take by mouth daily.      levofloxacin 250 MG  tablet   Commonly known as: LEVAQUIN   Take 1 tablet (250 mg total) by mouth daily.      montelukast 10 MG tablet   Commonly known as: SINGULAIR   Take 10 mg by mouth at bedtime.      pantoprazole 40 MG tablet   Commonly known as: PROTONIX   Take 1 tablet (40 mg total) by mouth daily.      predniSONE 20 MG tablet   Commonly known as: DELTASONE   Take 3 tablet by mouth X2 days; then 2 tablets by mouth daily X2 days, then 1 tablet by mouth daily X 2 days; then 1/2 tablet by mouth daily X 1 day and stop prednisone      primidone 50 MG tablet   Commonly known as: MYSOLINE   Take 50 mg by mouth at bedtime.      tiotropium 18 MCG inhalation capsule   Commonly known as: SPIRIVA   Place 18 mcg into inhaler and inhale daily.      triamcinolone 55 MCG/ACT nasal inhaler   Commonly known as: NASACORT   Place 2 sprays into the nose as needed. For allergies           Follow-up Information    Follow up with Oliver Barre, MD. Schedule an appointment as soon as possible for a visit in 2 weeks.   Contact information:   [address] [place] [address] [phone]       Call  Oneita Hurt, MD. (call to arrange follow up visit appointment; if not already schedule)    Contact information:   [address] [place] [address] [phone]           The results of significant diagnostics from this hospitalization (including imaging, microbiology, ancillary and laboratory) are listed below for reference.    Significant Diagnostic Studies: Dg Chest 2 View  08/23/2012  *RADIOLOGY REPORT*  Clinical Data: Fever and cough  CHEST - 2 VIEW  Comparison: 06/26/2012  Findings: Heart size appears normal.  No pleural effusions are identified.  Advanced interstitial lung disease consistent with pulmonary fibrosis identified.  Right lower lobe mass is again noted and appears increased from previous exam.  IMPRESSION:  1.  Advanced pulmonary fibrosis. 2.  Increase in right lower lobe lung mass.   Original Report Authenticated By: Signa Kell, M.D.    Nm Pulmonary Perf And Vent  08/29/2012  *RADIOLOGY REPORT*  Clinical data:  Shortness of breath, contrast allergy.  History of lung carcinoma, COPD, radiation pneumonitis. Most recent chest radiograph from 08/27/2012 shows a right perihilar mass with postobstructive pneumonitis and bilateral chronic interstitial lung disease.  VENTILATION PERFUSION SCINTIGRAPHY  Findings: Ventilation imaging with 41.4 mCi technetium in a DTPA inhaled   shows a coarse asymmetric distribution with significant central deposition, left greater than right.  No washout images. Perfusion imaging with 5. Tc31m MAA IV shows a defect corresponding to the right hilar mass with moderately decreased activity throughout the right middle and lower lobes.  There is otherwise somewhat heterogeneous distribution throughout the lungs, without left-sided segmental or subsegmental perfusion defect.  IMPRESSION 1.  Low likelihood ratio for pulmonary embolism.   Original Report Authenticated By: D. Andria Rhein, MD    Dg Chest Port 1 View  08/27/2012   *RADIOLOGY REPORT*  Clinical Data: Short of breath  PORTABLE CHEST - 1 VIEW  Comparison: Chest radiograph 08/23/2012  Findings: Normal cardiac silhouette.  There is a dense  right perihilar opacity again demonstrated.  There this represents a hypermetabolic mass on comparison PET CT scan of the 03/25/2012. There is a post obstructive pneumonitis in the right lower lobe which is similar to prior.  There is underlying chronic interstitial lung disease which is not changed from prior.  No pneumothorax.  IMPRESSION:  1.  No significant interval change. 2.  Right perihilar mass consistent with bronchogenic carcinoma. 3.  Mild postobstructive pneumonitis in the right lung. 4.  Chronic interstitial lung disease.   Original Report Authenticated By: Genevive Bi, M.D.     Microbiology: Recent Results (from the past 240 hour(s))  RAPID STREP SCREEN     Status: Normal   Collection Time   08/23/12 10:10 PM      Component Value Range Status Comment   Streptococcus, Group A Screen (Direct) NEGATIVE  NEGATIVE Final   CULTURE, BLOOD (ROUTINE X 2)     Status: Normal (Preliminary result)   Collection Time   08/27/12  9:37 PM      Component Value Range Status Comment   Specimen Description BLOOD RIGHT ARM   Final    Special Requests BOTTLES DRAWN AEROBIC AND ANAEROBIC 6CC   Final    Culture  Setup Time 08/28/2012 02:17   Final    Culture     Final    Value:        BLOOD CULTURE RECEIVED NO GROWTH TO DATE CULTURE WILL BE HELD FOR 5 DAYS BEFORE ISSUING A FINAL NEGATIVE REPORT   Report Status PENDING   Incomplete   CULTURE, BLOOD (ROUTINE X 2)     Status: Normal (Preliminary result)   Collection Time   08/27/12  9:44 PM      Component Value Range Status Comment   Specimen Description BLOOD LEFT HAND   Final    Special Requests BOTTLES DRAWN AEROBIC ONLY 5CC   Final    Culture  Setup Time 08/28/2012 02:17   Final    Culture     Final    Value:        BLOOD CULTURE RECEIVED NO GROWTH TO DATE CULTURE WILL BE  HELD FOR 5 DAYS BEFORE ISSUING A FINAL NEGATIVE REPORT   Report Status PENDING   Incomplete   CULTURE, EXPECTORATED SPUTUM-ASSESSMENT     Status: Normal   Collection Time   08/28/12  6:26 AM      Component Value Range Status Comment   Specimen Description SPUTUM   Final    Special Requests Immunocompromised   Final    Sputum evaluation     Final    Value: THIS SPECIMEN IS ACCEPTABLE. RESPIRATORY CULTURE REPORT TO FOLLOW.   Report Status 08/28/2012 FINAL   Final   CULTURE, RESPIRATORY     Status: Normal (Preliminary result)   Collection Time   08/28/12  6:26 AM      Component Value Range Status Comment   Specimen Description SPUTUM   Final    Special Requests NONE   Final    Gram Stain     Final    Value: RARE WBC PRESENT,BOTH PMN AND MONONUCLEAR     RARE SQUAMOUS EPITHELIAL CELLS PRESENT     RARE GRAM POSITIVE COCCI     IN CLUSTERS IN PAIRS   Culture Culture reincubated for better growth   Final    Report Status PENDING   Incomplete      Labs: Basic Metabolic Panel:  Lab 08/28/12 1610 08/27/12 1855 08/23/12 2115  NA 139 143  140  K 4.0 4.5 4.2  CL 101 103 98  CO2 31 33* 32  GLUCOSE 96 101* 190*  BUN 28* 30* 24*  CREATININE 1.24 1.18 1.10  CALCIUM 8.9 9.8 9.2  MG -- 1.7 --  PHOS -- 3.3 --   Liver Function Tests:  Lab 08/28/12 0500 08/27/12 1855  AST 21 34  ALT 12 16  ALKPHOS 56 65  BILITOT 0.3 0.3  PROT 7.0 7.9  ALBUMIN 2.8* 3.2*   CBC:  Lab 08/28/12 0500 08/27/12 1855 08/23/12 2115  WBC 5.3 6.4 5.9  NEUTROABS -- 5.1 5.1  HGB 10.9* 12.4* 12.2*  HCT 35.1* 38.5* 38.8*  MCV 86.2 82.8 86.0  PLT PLATELET CLUMPS NOTED ON SMEAR, COUNT APPEARS DECREASED PLATELET CLUMPS NOTED ON SMEAR, COUNT APPEARS DECREASED 97*   BNP: BNP (last 3 results)  Basename 10/07/11 1515  PROBNP 183.9   CBG:  Lab 08/28/12 0731  GLUCAP 94     Signed:  Talmage Teaster  [facility] 08/29/2012, 12:00 PM

## 2012-08-30 ENCOUNTER — Emergency Department (HOSPITAL_COMMUNITY): Payer: Medicare Other

## 2012-08-30 ENCOUNTER — Emergency Department (HOSPITAL_COMMUNITY)
Admission: EM | Admit: 2012-08-30 | Discharge: 2012-08-30 | Disposition: A | Discharge status: Home / Self Care | Payer: Medicare Other | Attending: Emergency Medicine | Admitting: Emergency Medicine

## 2012-08-30 ENCOUNTER — Encounter (HOSPITAL_COMMUNITY): Payer: Self-pay | Admitting: *Deleted

## 2012-08-30 DIAGNOSIS — J449 Chronic obstructive pulmonary disease, unspecified: Secondary | ICD-10-CM | POA: Insufficient documentation

## 2012-08-30 DIAGNOSIS — J309 Allergic rhinitis, unspecified: Secondary | ICD-10-CM | POA: Insufficient documentation

## 2012-08-30 DIAGNOSIS — M25519 Pain in unspecified shoulder: Secondary | ICD-10-CM | POA: Insufficient documentation

## 2012-08-30 DIAGNOSIS — G8929 Other chronic pain: Secondary | ICD-10-CM | POA: Insufficient documentation

## 2012-08-30 DIAGNOSIS — Z8719 Personal history of other diseases of the digestive system: Secondary | ICD-10-CM | POA: Insufficient documentation

## 2012-08-30 DIAGNOSIS — Z85118 Personal history of other malignant neoplasm of bronchus and lung: Secondary | ICD-10-CM | POA: Insufficient documentation

## 2012-08-30 DIAGNOSIS — E785 Hyperlipidemia, unspecified: Secondary | ICD-10-CM | POA: Insufficient documentation

## 2012-08-30 DIAGNOSIS — F3289 Other specified depressive episodes: Secondary | ICD-10-CM | POA: Insufficient documentation

## 2012-08-30 DIAGNOSIS — W1809XA Striking against other object with subsequent fall, initial encounter: Secondary | ICD-10-CM | POA: Insufficient documentation

## 2012-08-30 DIAGNOSIS — F329 Major depressive disorder, single episode, unspecified: Secondary | ICD-10-CM | POA: Insufficient documentation

## 2012-08-30 DIAGNOSIS — Z7982 Long term (current) use of aspirin: Secondary | ICD-10-CM | POA: Insufficient documentation

## 2012-08-30 DIAGNOSIS — R259 Unspecified abnormal involuntary movements: Secondary | ICD-10-CM | POA: Insufficient documentation

## 2012-08-30 DIAGNOSIS — J45909 Unspecified asthma, uncomplicated: Secondary | ICD-10-CM | POA: Insufficient documentation

## 2012-08-30 DIAGNOSIS — S1093XA Contusion of unspecified part of neck, initial encounter: Secondary | ICD-10-CM | POA: Insufficient documentation

## 2012-08-30 DIAGNOSIS — Y9389 Activity, other specified: Secondary | ICD-10-CM | POA: Insufficient documentation

## 2012-08-30 DIAGNOSIS — E119 Type 2 diabetes mellitus without complications: Secondary | ICD-10-CM | POA: Insufficient documentation

## 2012-08-30 DIAGNOSIS — S02401A Maxillary fracture, unspecified, initial encounter for closed fracture: Secondary | ICD-10-CM | POA: Insufficient documentation

## 2012-08-30 DIAGNOSIS — Z87442 Personal history of urinary calculi: Secondary | ICD-10-CM | POA: Insufficient documentation

## 2012-08-30 DIAGNOSIS — J4489 Other specified chronic obstructive pulmonary disease: Secondary | ICD-10-CM | POA: Insufficient documentation

## 2012-08-30 DIAGNOSIS — S0003XA Contusion of scalp, initial encounter: Secondary | ICD-10-CM | POA: Insufficient documentation

## 2012-08-30 DIAGNOSIS — Z872 Personal history of diseases of the skin and subcutaneous tissue: Secondary | ICD-10-CM | POA: Insufficient documentation

## 2012-08-30 DIAGNOSIS — Z862 Personal history of diseases of the blood and blood-forming organs and certain disorders involving the immune mechanism: Secondary | ICD-10-CM | POA: Insufficient documentation

## 2012-08-30 DIAGNOSIS — Z79899 Other long term (current) drug therapy: Secondary | ICD-10-CM | POA: Insufficient documentation

## 2012-08-30 DIAGNOSIS — Z923 Personal history of irradiation: Secondary | ICD-10-CM | POA: Insufficient documentation

## 2012-08-30 DIAGNOSIS — Z87891 Personal history of nicotine dependence: Secondary | ICD-10-CM | POA: Insufficient documentation

## 2012-08-30 DIAGNOSIS — K5900 Constipation, unspecified: Secondary | ICD-10-CM | POA: Insufficient documentation

## 2012-08-30 DIAGNOSIS — T148XXA Other injury of unspecified body region, initial encounter: Secondary | ICD-10-CM | POA: Insufficient documentation

## 2012-08-30 DIAGNOSIS — Z87448 Personal history of other diseases of urinary system: Secondary | ICD-10-CM | POA: Insufficient documentation

## 2012-08-30 DIAGNOSIS — S02400A Malar fracture unspecified, initial encounter for closed fracture: Secondary | ICD-10-CM | POA: Insufficient documentation

## 2012-08-30 DIAGNOSIS — F411 Generalized anxiety disorder: Secondary | ICD-10-CM | POA: Insufficient documentation

## 2012-08-30 DIAGNOSIS — Y92009 Unspecified place in unspecified non-institutional (private) residence as the place of occurrence of the external cause: Secondary | ICD-10-CM | POA: Insufficient documentation

## 2012-08-30 DIAGNOSIS — K219 Gastro-esophageal reflux disease without esophagitis: Secondary | ICD-10-CM | POA: Insufficient documentation

## 2012-08-30 DIAGNOSIS — S0083XA Contusion of other part of head, initial encounter: Secondary | ICD-10-CM

## 2012-08-30 DIAGNOSIS — S5000XA Contusion of unspecified elbow, initial encounter: Secondary | ICD-10-CM

## 2012-08-30 DIAGNOSIS — Z8679 Personal history of other diseases of the circulatory system: Secondary | ICD-10-CM | POA: Insufficient documentation

## 2012-08-30 DIAGNOSIS — Z8546 Personal history of malignant neoplasm of prostate: Secondary | ICD-10-CM | POA: Insufficient documentation

## 2012-08-30 LAB — CULTURE, RESPIRATORY W GRAM STAIN: Culture: NORMAL

## 2012-08-30 MED ORDER — BACITRACIN ZINC 500 UNIT/GM EX OINT
TOPICAL_OINTMENT | CUTANEOUS | Status: AC
  Administered 2012-08-30: 07:00:00
  Filled 2012-08-30: qty 1.8

## 2012-08-30 NOTE — ED Provider Notes (Signed)
History     CSN: [id number]  Arrival date & time 08/30/12  4098   First MD Initiated Contact with Patient 08/30/12 [phone]      Chief Complaint  Patient presents with  . Fall    (Consider location/radiation/quality/duration/timing/severity/associated sxs/prior treatment) HPI Level V caveat: Unintelligible speech due to chronic tremor. This is an 79 year old male with a history of lung cancer and chronic interstitial lung disease. He lost his balance and fell against a cabinet in his kitchen this morning. There was no loss of consciousness. EMS notes skin tears to his left elbow and upper arm, right upper arm and right lateral eyebrow, with associated hematomas of the left elbow and right eyebrow. The patient is denying pain. EMS notes and to be moving his extremities without difficulty. He was fully spinally immobilized prior to transport.  Past Medical History  Diagnosis Date  . Anxiety   . Asthma   . Depression   . Kidney stones     hx of   . Pulmonary fibrosis   . Hx of radiation therapy may 2010 thru june 2010    5 tx, stereotactic radiotherapy  . Tremor, essential   . COPD (chronic obstructive pulmonary disease)   . DM (diabetes mellitus)   . Normocytic anemia   . Diverticulosis   . Allergic rhinitis   . Hyperlipidemia   . Adenomatous colon polyp   . Chronic left shoulder pain   . Chronic constipation   . Hemorrhoids   . Cellulitis of leg, right   . Edema   . Radiation May/June 2010    stereotactic body radiotherapy  . Urethral stricture 04/18/2012    Dr mcdermot/urology  . GERD (gastroesophageal reflux disease)   . Hx of radiation therapy 05/08/12 -05/21/12    RLL lung  . Zenker's diverticulum   . Prostate cancer 2005    gleason 4+4=8  . Lung cancer 2010, 2013    RLL, 2013 recurrent     Past Surgical History  Procedure Date  . Appendectomy   . Hdr brachytherapy implant for prostate cancer 2005  . Cataract extraction, bilateral   . Internal urethrotomy  12/09/2008  . Colonoscopy October 2008    Diverticulosis, hemorrhoids  . Tonsillectomy 1943  . Ct biopsy 04/11/2012    invasive squamous cell carcinoma    Family History  Problem Relation Age of Onset  . Prostate cancer Brother   . Colon cancer Sister   . Bladder Cancer Father   . Lung cancer Brother   . Heart disease Brother     History  Substance Use Topics  . Smoking status: Former Smoker -- 1.0 packs/day for 35 years    Quit date: 08/14/1989  . Smokeless tobacco: Never Used  . Alcohol Use: No     Comment: none in 10 years      Review of Systems  Unable to perform ROS   Allergies  Iodine and Sulfonamide derivatives  Home Medications   Current Outpatient Rx  Name  Route  Sig  Dispense  Refill  . ASPIRIN EC 81 MG PO TBEC   Oral   Take 81 mg by mouth every morning.          . B COMPLEX VITAMINS PO CAPS   Oral   Take 1 capsule by mouth daily.         Marland Kitchen CLONAZEPAM 2 MG PO TABS   Oral   Take 2 mg by mouth 2 (two) times daily. scheduled         .  FENOFIBRATE 145 MG PO TABS   Oral   Take 1 tablet (145 mg total) by mouth daily.   90 tablet   3   . OMEGA-3 FATTY ACIDS 1000 MG PO CAPS   Oral   Take by mouth daily.          Marland Kitchen LEVOFLOXACIN 250 MG PO TABS   Oral   Take 1 tablet (250 mg total) by mouth daily.   6 tablet   0   . MONTELUKAST SODIUM 10 MG PO TABS   Oral   Take 10 mg by mouth at bedtime.         Marland Kitchen PANTOPRAZOLE SODIUM 40 MG PO TBEC   Oral   Take 1 tablet (40 mg total) by mouth daily.   30 tablet   1   . PREDNISONE 20 MG PO TABS      Take 3 tablet by mouth X2 days; then 2 tablets by mouth daily X2 days, then 1 tablet by mouth daily X 2 days; then 1/2 tablet by mouth daily X 1 day and stop prednisone   13 tablet   0   . PRIMIDONE 50 MG PO TABS   Oral   Take 25 mg by mouth at bedtime. Patient son in law states that this will increase to 50mg  next week. The date is unknown.         Marland Kitchen ALIGN 4 MG PO CAPS   Oral   Take 1  capsule by mouth daily.         Marland Kitchen TIOTROPIUM BROMIDE MONOHYDRATE 18 MCG IN CAPS   Inhalation   Place 18 mcg into inhaler and inhale daily.           . TRIAMCINOLONE ACETONIDE 55 MCG/ACT NA INHA   Nasal   Place 2 sprays into the nose as needed. For allergies           BP 137/74  Pulse 99  Temp 98.1 F (36.7 C) (Oral)  Resp 20  SpO2 92%  Physical Exam General: Well-developed, well-nourished male in no acute distress; appearance consistent with age of record; immobilized on spine board HENT: normocephalic, hematoma with overlying skin tear right lateral eyebrow Eyes: pupils equal round and reactive to light; extraocular muscles intact; arcus senilis bilaterally Neck: Immobilized in cervical collar; C-spine nontender; neck supple Heart: regular rate and rhythm Lungs: Rales and rhonchi bilaterally; patient on around-the-clock O2 at 3 L nasal cannula Abdomen: soft; nondistended; nontender Extremities: Arthritic changes; full range of motion; pulses normal Neurologic: Awake, alert; motor function intact in all extremities and symmetric; no facial droop; tremor Skin: Warm and dry; skin tears of left medial elbow and upper arm, right upper arm; large hematoma of left medial elbow without significant tenderness Psychiatric: Normal mood and affect    ED Course  Procedures (including critical care time)    MDM  Nursing notes and vitals signs, including pulse oximetry, reviewed.  Summary of this visit's results, reviewed by myself:  Labs:  No results found for this or any previous visit (from the past 24 hour(s)).  Imaging Studies: Dg Shoulder Right  08/30/2012  *RADIOLOGY REPORT*  Clinical Data: Status post fall; generalized right shoulder pain.  RIGHT SHOULDER - 2+ VIEW  Comparison: Right shoulder CT performed 07/09/2009, and right shoulder radiographs performed 04/19/2007  Findings: There is no evidence of fracture or dislocation.  There is significantly worsened superior  subluxation of the right humeral head, with mild remodelling of the acromion, reflecting a chronic  right-sided rotator cuff tear.  The acromioclavicular joint demonstrates mild degenerative change.  No significant soft tissue abnormalities are seen.  There is worsening interstitial opacity within the right lung; this could reflect mild pulmonary edema or worsening right-sided pneumonia.  The patient's underlying lung mass is again noted.  IMPRESSION:  1.  No evidence of fracture or dislocation. 2.  Worsening superior subluxation of the right humeral head, with mild remodelling of the acromion, reflecting a chronic right-sided rotator cuff tear. 3.  Worsening interstitial distal opacity within the right lung could reflect mild pulmonary edema or worsening right-sided pneumonia.  Underlying lung mass again noted.   Original Report Authenticated By: Tonia Ghent, M.D.    Dg Elbow Complete Left  08/30/2012  *RADIOLOGY REPORT*  Clinical Data: Status post fall; skin tears to the posterior left elbow.  LEFT ELBOW - COMPLETE 3+ VIEW  Comparison: Left humerus radiographs performed 12/11/2009  Findings: There is no evidence of fracture or dislocation.  A tiny nonspecific ossicle is noted adjacent to the lateral epicondyle.  A small osseous fragment adjacent to the radial head on the lateral view may be degenerative in nature.  The visualized joint spaces are preserved.  No significant joint effusion is identified.  Marked soft tissue swelling is noted overlying the medial epicondyle.  IMPRESSION:  1.  No evidence of fracture or dislocation. 2.  Marked soft tissue swelling noted overlying the medial epicondyle.   Original Report Authenticated By: Tonia Ghent, M.D.    Ct Head Wo Contrast  08/30/2012  *RADIOLOGY REPORT*  Clinical Data: Status post fall; hit head on counter.  Right periorbital swelling.  CT HEAD WITHOUT CONTRAST  Technique:  Contiguous axial images were obtained from the base of the skull through the  vertex without contrast.  Comparison: CT of the head performed 07/04/2012  Findings: There is no evidence of acute infarction, mass lesion, or intra- or extra-axial hemorrhage on CT.  Prominence of the sulci reflects mild cortical volume loss.  Mild cerebellar atrophy is noted.  The brainstem and fourth ventricle are within normal limits.  The basal ganglia are unremarkable in appearance.  The cerebral hemispheres demonstrate grossly normal gray-white differentiation. No mass effect or midline shift is seen.  There is suggestion of a minimally displaced fracture through the anterior and lateral walls of the inferior right maxillary sinus, with trace blood in the right maxillary sinus.  The orbits are within normal limits; no definite orbital floor fracture is seen. The paranasal sinuses and mastoid air cells are well-aerated.  No significant soft tissue abnormalities are seen.  IMPRESSION:  1.  No evidence of traumatic intracranial injury. 2.  Likely minimally displaced fracture through the anterior and lateral walls of the inferior right maxillary sinus, with trace blood in the right maxillary sinus.  No evidence of orbital floor fracture. 3.  Mild cortical volume loss noted.   Original Report Authenticated By: Tonia Ghent, M.D.    6:50 AM Patient in no distress. Patient's son-in-law states the patient is at his baseline neurologically.    Hanley Seamen, MD 08/30/12 [phone]

## 2012-08-30 NOTE — ED Notes (Signed)
Pt to ED via EMS s/p fall; family reports that pt was up in the kitchen getting a drink of water and tripped falling into counter striking rt side of face; No LOC; hematoma to rt eye area; pt also presents with skin tears to rt forearm and left bicep with a large hematoma to left elbow; pt denies LOC; pt placed on LSB and c-collar per EMS; family reports to EMS that pt is alert per his normal.

## 2012-08-30 NOTE — ED Notes (Signed)
Pt's son in law at bedside. Sts he normally would take pt home but pt's oxygen bottle is empty. PTAR at bedside to transport.

## 2012-08-30 NOTE — ED Notes (Signed)
WUJ:[id number]<JY> Expected date:08/30/12<BR> Expected time: 4:23 AM<BR> Means of arrival:Ambulance<BR> Comments:<BR> Fall; laceration

## 2012-09-02 NOTE — Telephone Encounter (Signed)
Called left message to call back 

## 2012-09-03 LAB — CULTURE, BLOOD (ROUTINE X 2): Culture: NO GROWTH

## 2012-09-03 NOTE — Telephone Encounter (Signed)
Called left message to call back 

## 2012-09-04 NOTE — Telephone Encounter (Signed)
Called the patient unable to contact by phone.

## 2012-09-06 ENCOUNTER — Ambulatory Visit (INDEPENDENT_AMBULATORY_CARE_PROVIDER_SITE_OTHER): Payer: Medicare Other | Admitting: Internal Medicine

## 2012-09-06 ENCOUNTER — Other Ambulatory Visit (INDEPENDENT_AMBULATORY_CARE_PROVIDER_SITE_OTHER): Payer: Medicare Other

## 2012-09-06 ENCOUNTER — Encounter: Payer: Self-pay | Admitting: Internal Medicine

## 2012-09-06 VITALS — BP 120/72 | HR 59 | Temp 98.0°F | Wt 146.4 lb

## 2012-09-06 DIAGNOSIS — E785 Hyperlipidemia, unspecified: Secondary | ICD-10-CM

## 2012-09-06 DIAGNOSIS — E119 Type 2 diabetes mellitus without complications: Secondary | ICD-10-CM

## 2012-09-06 DIAGNOSIS — J9691 Respiratory failure, unspecified with hypoxia: Secondary | ICD-10-CM

## 2012-09-06 DIAGNOSIS — N289 Disorder of kidney and ureter, unspecified: Secondary | ICD-10-CM

## 2012-09-06 DIAGNOSIS — J441 Chronic obstructive pulmonary disease with (acute) exacerbation: Secondary | ICD-10-CM

## 2012-09-06 LAB — BASIC METABOLIC PANEL
CO2: 34 mEq/L — ABNORMAL HIGH (ref 19–32)
Calcium: 9.2 mg/dL (ref 8.4–10.5)
Chloride: 98 mEq/L (ref 96–112)
Sodium: 140 mEq/L (ref 135–145)

## 2012-09-06 LAB — CBC WITH DIFFERENTIAL/PLATELET
Basophils Absolute: 0 10*3/uL (ref 0.0–0.1)
Lymphocytes Relative: 6 % — ABNORMAL LOW (ref 12.0–46.0)
Monocytes Relative: 5.3 % (ref 3.0–12.0)
Platelets: 164 10*3/uL (ref 150.0–400.0)
RDW: 17.7 % — ABNORMAL HIGH (ref 11.5–14.6)

## 2012-09-06 LAB — LIPID PANEL
HDL: 35 mg/dL — ABNORMAL LOW (ref >39.00)
LDL Cholesterol: 65 mg/dL (ref 0–99)
Total CHOL/HDL Ratio: 4
Triglycerides: 151 mg/dL — ABNORMAL HIGH (ref 0.0–149.0)

## 2012-09-06 NOTE — Patient Instructions (Addendum)
Zella Ball to make sure your appointment is actually set for Dr Tat, for Jan 14 at 1 pm Continue all other medications as before Please have the pharmacy call with any other refills you may need. Please go to LAB in the Basement for the blood and/or urine tests to be done today You will be contacted by phone if any changes need to be made immediately.  Otherwise, you will receive a letter about your results with an explanation, but please check with MyChart first. Thank you for enrolling in MyChart. Please follow the instructions below to securely access your online medical record. MyChart allows you to send messages to your doctor, view your test results, renew your prescriptions, schedule appointments, and more. To Log into MyChart, please go to [email or web address], and your Username is: [email or web address] (password [email or web address]) Please return in 3 mo with Lab testing done 3-5 days before

## 2012-09-07 ENCOUNTER — Encounter: Payer: Self-pay | Admitting: Internal Medicine

## 2012-09-07 DIAGNOSIS — N289 Disorder of kidney and ureter, unspecified: Secondary | ICD-10-CM | POA: Insufficient documentation

## 2012-09-07 DIAGNOSIS — J441 Chronic obstructive pulmonary disease with (acute) exacerbation: Secondary | ICD-10-CM | POA: Insufficient documentation

## 2012-09-07 NOTE — Assessment & Plan Note (Signed)
Resolved,  to f/u any worsening symptoms or concerns,  SpO2 Readings from Last 3 Encounters:  09/06/12 91%  08/30/12 96%  08/29/12 92%

## 2012-09-07 NOTE — Assessment & Plan Note (Signed)
stable overall by history and exam, recent data reviewed with pt, and pt to continue medical treatment as before,  to f/u any worsening symptoms or concerns Lab Results  Component Value Date   LDLCALC 65 09/06/2012

## 2012-09-07 NOTE — Assessment & Plan Note (Signed)
Resolved, to cont home o2,  to f/u any worsening symptoms or concerns

## 2012-09-07 NOTE — Assessment & Plan Note (Signed)
Volume stable, for f/u lab today Lab Results  Component Value Date   WBC 10.6* 09/06/2012   HGB 12.4* 09/06/2012   HCT 38.4* 09/06/2012   PLT 164.0 09/06/2012   GLUCOSE 108* 09/06/2012   CHOL 130 09/06/2012   TRIG 151.0* 09/06/2012   HDL 35.00* 09/06/2012   LDLCALC 65 09/06/2012   ALT 12 08/28/2012   AST 21 08/28/2012   NA 140 09/06/2012   K 4.0 09/06/2012   CL 98 09/06/2012   CREATININE 1.2 09/06/2012   BUN 32* 09/06/2012   CO2 34* 09/06/2012   TSH 1.411 08/27/2012   INR 1.19 08/27/2012   HGBA1C 6.7* 09/06/2012

## 2012-09-07 NOTE — Assessment & Plan Note (Signed)
stable overall by history and exam, recent data reviewed with pt, and pt to continue medical treatment as before,  to f/u any worsening symptoms or concerns Lab Results  Component Value Date   HGBA1C 6.7* 09/06/2012

## 2012-09-07 NOTE — Progress Notes (Signed)
Subjective:    Patient ID: Roy [name], male    DOB: 07-07-1921, 79 y.o.   MRN: [id number]  HPI  Pt here post hospn, on home o2 and overall doing well - Overall good compliance with treatment, and good medicine tolerability. - finishing antibx and predpack.  Pt denies chest pain, increased sob or doe, wheezing, orthopnea, PND, increased LE swelling, palpitations, dizziness or syncope.  No overt bleeding or bruising.  Had renal insuff noted and rec'd for bmet now.  Had recent fall, ongoing tremor, and persistent spasmodic dysphonia.  Pt denies new neurological symptoms such as new headache, or facial or extremity weakness or numbness   Pt denies polydipsia, polyuria., Pt states has appt Jan 14 with Dr Unice Bailey but no acute appt exists in the EMR Past Medical History  Diagnosis Date  . Anxiety   . Asthma   . Depression   . Kidney stones     hx of   . Pulmonary fibrosis   . Hx of radiation therapy may 2010 thru june 2010    5 tx, stereotactic radiotherapy  . Tremor, essential   . COPD (chronic obstructive pulmonary disease)   . DM (diabetes mellitus)   . Normocytic anemia   . Diverticulosis   . Allergic rhinitis   . Hyperlipidemia   . Adenomatous colon polyp   . Chronic left shoulder pain   . Chronic constipation   . Hemorrhoids   . Cellulitis of leg, right   . Edema   . Radiation May/June 2010    stereotactic body radiotherapy  . Urethral stricture 04/18/2012    Dr mcdermot/urology  . GERD (gastroesophageal reflux disease)   . Hx of radiation therapy 05/08/12 -05/21/12    RLL lung  . Zenker's diverticulum   . Prostate cancer 2005    gleason 4+4=8  . Lung cancer 2010, 2013    RLL, 2013 recurrent    Past Surgical History  Procedure Date  . Appendectomy   . Hdr brachytherapy implant for prostate cancer 2005  . Cataract extraction, bilateral   . Internal urethrotomy 12/09/2008  . Colonoscopy October 2008    Diverticulosis, hemorrhoids  . Tonsillectomy 1943  . Ct biopsy  04/11/2012    invasive squamous cell carcinoma    reports that he quit smoking about 23 years ago. He has never used smokeless tobacco. He reports that he does not drink alcohol or use illicit drugs. family history includes Bladder Cancer in his father; Colon cancer in his sister; Heart disease in his brother; Lung cancer in his brother; and Prostate cancer in his brother. Allergies  Allergen Reactions  . Iodine Nausea Only  . Sulfonamide Derivatives Nausea And Vomiting   Current Outpatient Prescriptions on File Prior to Visit  Medication Sig Dispense Refill  . aspirin EC 81 MG tablet Take 81 mg by mouth every morning.       Marland Kitchen b complex vitamins capsule Take 1 capsule by mouth daily.      . clonazePAM (KLONOPIN) 2 MG tablet Take 2 mg by mouth 2 (two) times daily. scheduled      . fenofibrate (TRICOR) 145 MG tablet Take 1 tablet (145 mg total) by mouth daily.  90 tablet  3  . fish oil-omega-3 fatty acids 1000 MG capsule Take by mouth daily.       . montelukast (SINGULAIR) 10 MG tablet Take 10 mg by mouth at bedtime.      . pantoprazole (PROTONIX) 40 MG tablet Take 1 tablet (40 mg  total) by mouth daily.  30 tablet  1  . predniSONE (DELTASONE) 20 MG tablet Take 3 tablet by mouth X2 days; then 2 tablets by mouth daily X2 days, then 1 tablet by mouth daily X 2 days; then 1/2 tablet by mouth daily X 1 day and stop prednisone  13 tablet  0  . primidone (MYSOLINE) 50 MG tablet Take 25 mg by mouth at bedtime. Patient son in law states that this will increase to 50mg  next week. The date is unknown.      . Probiotic Product (ALIGN) 4 MG CAPS Take 1 capsule by mouth daily.      Marland Kitchen tiotropium (SPIRIVA) 18 MCG inhalation capsule Place 18 mcg into inhaler and inhale daily.        Marland Kitchen triamcinolone (NASACORT) 55 MCG/ACT nasal inhaler Place 2 sprays into the nose as needed. For allergies      . [DISCONTINUED] DICYCLOMINE HCL PO Take 1 tablet by mouth daily.        Review of Systems  Constitutional: Negative  for unexpected weight change, or unusual diaphoresis  HENT: Negative for tinnitus.   Eyes: Negative for photophobia and visual disturbance.  Respiratory: Negative for choking and stridor.   Gastrointestinal: Negative for vomiting and blood in stool.  Genitourinary: Negative for hematuria and decreased urine volume.  Musculoskeletal: Negative for acute joint swelling Skin: Negative for color change and wound.  Neurological: Negative for tremors and numbness other than noted  Psychiatric/Behavioral: Negative for decreased concentration or  hyperactivity.       Objective:   Physical Exam BP 120/72  Pulse 59  Temp 98 F (36.7 C) (Oral)  Wt 146 lb 6 oz (66.395 kg)  SpO2 91% Physical Exam  VS noted,  Constitutional: Pt appears well-developed and well-nourished.  HENT: Head: NCAT.  Right Ear: External ear normal.  Left Ear: External ear normal.  Eyes: Conjunctivae and EOM are normal. Pupils are equal, round, and reactive to light.  Neck: Normal range of motion. Neck supple.  Cardiovascular: Normal rate and regular rhythm.   Pulmonary/Chest: Effort normal and breath sounds decreased, no wheezing or rales.  Abd:  Soft, NT, non-distended, + BS Neurological: Pt is alert. Not confused  Skin: Skin is warm. No erythema.  Psychiatric: Pt behavior is normal. Thought content normal.     Assessment & Plan:

## 2012-09-10 ENCOUNTER — Ambulatory Visit: Payer: Medicare Other | Admitting: Neurology

## 2012-09-11 ENCOUNTER — Ambulatory Visit: Payer: Medicare Other | Admitting: Neurology

## 2012-09-16 DIAGNOSIS — G250 Essential tremor: Secondary | ICD-10-CM

## 2012-09-16 DIAGNOSIS — J441 Chronic obstructive pulmonary disease with (acute) exacerbation: Secondary | ICD-10-CM

## 2012-09-16 DIAGNOSIS — G252 Other specified forms of tremor: Secondary | ICD-10-CM

## 2012-09-16 DIAGNOSIS — J8410 Pulmonary fibrosis, unspecified: Secondary | ICD-10-CM

## 2012-09-16 DIAGNOSIS — C3490 Malignant neoplasm of unspecified part of unspecified bronchus or lung: Secondary | ICD-10-CM

## 2012-09-23 ENCOUNTER — Other Ambulatory Visit (HOSPITAL_COMMUNITY): Payer: Self-pay | Admitting: Urology

## 2012-09-23 ENCOUNTER — Telehealth: Payer: Self-pay | Admitting: Emergency Medicine

## 2012-09-23 DIAGNOSIS — C61 Malignant neoplasm of prostate: Secondary | ICD-10-CM

## 2012-09-23 NOTE — Telephone Encounter (Signed)
Spoke with Jacki Cones  She states that the pt had a cold back in Dec and still not completely over it She states that he has been c/o having increased SOB at hs and has to sleep sitting up OV with TP tomorrow I advised to seek emergent care sooner if needed

## 2012-09-24 ENCOUNTER — Ambulatory Visit: Payer: Medicare Other | Admitting: Adult Health

## 2012-09-26 ENCOUNTER — Other Ambulatory Visit: Payer: Self-pay | Admitting: Radiation Oncology

## 2012-09-26 ENCOUNTER — Ambulatory Visit (HOSPITAL_COMMUNITY)
Admission: RE | Admit: 2012-09-26 | Discharge: 2012-09-26 | Disposition: A | Discharge status: Home / Self Care | Payer: Medicare Other | Source: Ambulatory Visit | Attending: Radiation Oncology | Admitting: Radiation Oncology

## 2012-09-26 DIAGNOSIS — I77819 Aortic ectasia, unspecified site: Secondary | ICD-10-CM | POA: Insufficient documentation

## 2012-09-26 DIAGNOSIS — K769 Liver disease, unspecified: Secondary | ICD-10-CM | POA: Insufficient documentation

## 2012-09-26 DIAGNOSIS — R599 Enlarged lymph nodes, unspecified: Secondary | ICD-10-CM | POA: Insufficient documentation

## 2012-09-26 DIAGNOSIS — N281 Cyst of kidney, acquired: Secondary | ICD-10-CM | POA: Insufficient documentation

## 2012-09-26 DIAGNOSIS — N200 Calculus of kidney: Secondary | ICD-10-CM | POA: Insufficient documentation

## 2012-09-26 DIAGNOSIS — R0602 Shortness of breath: Secondary | ICD-10-CM | POA: Insufficient documentation

## 2012-09-26 DIAGNOSIS — J8410 Pulmonary fibrosis, unspecified: Secondary | ICD-10-CM | POA: Insufficient documentation

## 2012-09-26 DIAGNOSIS — I700 Atherosclerosis of aorta: Secondary | ICD-10-CM | POA: Insufficient documentation

## 2012-09-26 DIAGNOSIS — K8020 Calculus of gallbladder without cholecystitis without obstruction: Secondary | ICD-10-CM | POA: Insufficient documentation

## 2012-09-26 DIAGNOSIS — C3490 Malignant neoplasm of unspecified part of unspecified bronchus or lung: Secondary | ICD-10-CM

## 2012-09-26 DIAGNOSIS — K5730 Diverticulosis of large intestine without perforation or abscess without bleeding: Secondary | ICD-10-CM | POA: Insufficient documentation

## 2012-09-26 DIAGNOSIS — C61 Malignant neoplasm of prostate: Secondary | ICD-10-CM

## 2012-09-26 DIAGNOSIS — Q762 Congenital spondylolisthesis: Secondary | ICD-10-CM | POA: Insufficient documentation

## 2012-09-26 DIAGNOSIS — I2510 Atherosclerotic heart disease of native coronary artery without angina pectoris: Secondary | ICD-10-CM | POA: Insufficient documentation

## 2012-09-27 ENCOUNTER — Ambulatory Visit (INDEPENDENT_AMBULATORY_CARE_PROVIDER_SITE_OTHER): Payer: Medicare Other | Admitting: Adult Health

## 2012-09-27 ENCOUNTER — Encounter: Payer: Self-pay | Admitting: Adult Health

## 2012-09-27 VITALS — BP 96/62 | Temp 98.4°F | Ht 66.0 in | Wt 145.0 lb

## 2012-09-27 DIAGNOSIS — J441 Chronic obstructive pulmonary disease with (acute) exacerbation: Secondary | ICD-10-CM

## 2012-09-27 MED ORDER — PREDNISONE 10 MG PO TABS: ORAL_TABLET | ORAL | Status: DC

## 2012-09-27 MED ORDER — LEVALBUTEROL HCL 0.63 MG/3ML IN NEBU
0.6300 mg | INHALATION_SOLUTION | Freq: Once | RESPIRATORY_TRACT | Status: AC
  Administered 2012-09-27: 0.63 mg via RESPIRATORY_TRACT

## 2012-09-27 NOTE — Patient Instructions (Addendum)
Prednisone taper over next week.  Mucinex Twice daily  For congestion  Delsym 2 tsp Twice daily  As needed  Cough.  Flutter valve Three times a day  As needed  Congestion  follow up w/ Dr. Delton Coombes  In 1 month and As needed   Please contact office for sooner follow up if symptoms do not improve or worsen or seek emergency care

## 2012-09-27 NOTE — Progress Notes (Signed)
History of Present Illness:  79 yo former smoker with hx COPD, prostatic CA s/p resection 2005, ureteral strictures. Evaluated in ED 3/10 for abd pain, suspected from full bladder, some tremor and dysarthria. Underwent MRI abdomen that revealed RLL mass. Subsequent PET/CT also shows subpleural ILD. Has albuterol, rarely requires. uses Spiriva daily.   12/31/08 -- Pt returns to [facility] today s/p needle bx of his RUL mass. The bx shows non-small cell lung CA, with features suggestive of adenoCA. Has completed swallowing eval for his ILD w/u.   ROV 10/27/09 -- Returns today to tell me that he is having more trouble swalowing. Food sems to go down OK, but he has consistent coughing with thin liquids. His speech Rx eval in May 10 recommended thin liquids Completed radiation in June 10. Followed by Dr Kathrynn Running with serial scans, next one to be in May 11. Wt stable. Some stable exertional SOB. No exacerbations of COPD in last year.   ROV 11/30/09 -- follows up to discuss swallow eval results. Tells me today he has been more fatigued, much more tired since last visit. His diet has "gone to hell" since his swallowing eval showed that he needs nectar thick liquids, has silent aspiration of large bolused solids.   ROV 02/16/10 -- returns for f/u of his ILD from aspiration, COPD, adenoCA s/p XRT. Since last visit has had CT scan in 5/11 that showed some scarring in the region of his lung mass. Last time we changed back to his usual diet because the modified diet was intolerable. His wt is up some 5 lbs. Still trying to use thickener in his drinks.   ROV 06/29/10 -- hx chronic aspiration and ILD, adenoCA s/p XRT. Since last visit he has several falls, still lives alone. Tells me that he is feeling OK, but the thickened diet is making him constipated. Miralax was started about 4 days ago. Was just in [place] for rehab after discharged for a fall. No aspiration symptoms, no cough with taking by mouth as long as he thinckens. Spiriva  was substituted with atrovent briefly, now back on Spiriva - missed it.   ROV 05/28/12 -- hx chronic aspiration and ILD, adenoCA s/p XRT. Returns for eval progressive dyspnea. 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 Comparison of PET 7/22 to  CT scan from 01/2011  And 07/2011 show stable ILD changes. He tells me that minimal exertion is now limiting him - SOB after 50 - 60 feet walking. 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 Was confused in late October, possibly due to the XRT. Suffered a fall 11/7 >> CT scan head and C-spine OK. Has been having trouble getting his O2 clarified - was initially on eclipse, then to concentrator + liquid o2 and pulsed O2. Currently set on 3L/min pulsed. Feels his MS is back to normal.   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 Hx of Zenker's Diverticulum.  Complains of rattling cough x 4 weeks. Feels breathing has gotten worse. Has had lots of congestion since [place]. Reports difficulty with sleeping because he can't breathe due to thick congestion and rattle in  his chest.  Admitted 08/26/12 -08/29/12 for Acute on chronic resp failure and SOB: secondary to interstitial fibrosis, mild exacerbation of COPD and radiation pneumonitis. Mild bronchitic to bronchiectasis , tx w/ abx, steroids CE'z neg X 3, V/Q scan w/o PE.Fredna Dow he got some better but cough has not resolved.  Had CT chest 09/26/12 >5.2 cm mass-like opacity in the posterior medial right lower lobe,  mildly increased Has follow up with Dr. Kathrynn Running next week to discuss.  He denies any hemoptysis, orthopnea, PND, increased  leg swelling, fever, or chest pain.  ROS:  Constitutional:   No  weight loss, night sweats,  Fevers, chills, + fatigue, or  lassitude.  HEENT:   No headaches,  Difficulty swallowing,  Tooth/dental problems, or  Sore throat,                No sneezing, itching, ear ache,  +nasal congestion, post nasal drip,   CV:  No chest pain,  Orthopnea, PND, swelling in lower extremities, anasarca, dizziness, palpitations, syncope.   GI  No heartburn, indigestion, abdominal pain, nausea, vomiting, diarrhea, change in bowel habits, loss of appetite, bloody stools.   Resp:  ,  No coughing up of blood.   g.  No chest wall deformity  Skin: no rash or lesions.  GU: no dysuria, change in color of urine, no urgency or frequency.  No flank pain, no hematuria   MS:   .  No back pain.  Psych:  No change in mood or affect. No depression or anxiety.  No memory loss.      OBJ:  Gen: Pleasant, frail /elderly  in no distress,  normal affect  ENT: No lesions,  mouth clear,  oropharynx clear, no postnasal drip  Neck: No JVD, no TMG, no carotid bruits  Lungs: No use of accessory muscles, bilateral insp crackles all fields  Cardiovascular: RRR, heart sounds normal, no murmur or gallops, no peripheral edema  Musculoskeletal: No deformities, no cyanosis, significant + clubbing  Neuro: alert, significant tremor, good strength.   Skin: Warm, no lesions or rashes

## 2012-09-27 NOTE — Assessment & Plan Note (Addendum)
Slow to resolve flare complicated by underlying interstitial fibrosis and radiation pneumonitis No signs of active infection on CT chest yesterday  xopenex neb x 1 in office   Plan  Prednisone taper over next week.  Mucinex Twice daily  For congestion  Delsym 2 tsp Twice daily  As needed  Cough.  Flutter valve Three times a day  As needed  Congestion  follow up w/ Dr. Delton Coombes  In 1 month and As needed   Please contact office for sooner follow up if symptoms do not improve or worsen or seek emergency care

## 2012-09-27 NOTE — Addendum Note (Signed)
Addended by: Boone Master E on: 09/27/2012 05:02 PM   Modules accepted: Orders

## 2012-09-29 DIAGNOSIS — E119 Type 2 diabetes mellitus without complications: Secondary | ICD-10-CM

## 2012-09-29 DIAGNOSIS — J441 Chronic obstructive pulmonary disease with (acute) exacerbation: Secondary | ICD-10-CM

## 2012-09-29 DIAGNOSIS — J9600 Acute respiratory failure, unspecified whether with hypoxia or hypercapnia: Secondary | ICD-10-CM

## 2012-09-29 DIAGNOSIS — E785 Hyperlipidemia, unspecified: Secondary | ICD-10-CM

## 2012-09-29 DIAGNOSIS — N289 Disorder of kidney and ureter, unspecified: Secondary | ICD-10-CM

## 2012-10-03 ENCOUNTER — Ambulatory Visit
Admission: RE | Admit: 2012-10-03 | Discharge: 2012-10-03 | Disposition: A | Discharge status: Home / Self Care | Payer: Medicare Other | Source: Ambulatory Visit | Attending: Radiation Oncology | Admitting: Radiation Oncology

## 2012-10-03 ENCOUNTER — Encounter: Payer: Self-pay | Admitting: Radiation Oncology

## 2012-10-03 ENCOUNTER — Encounter (HOSPITAL_COMMUNITY): Payer: Medicare Other

## 2012-10-03 VITALS — BP 107/56 | HR 105 | Temp 97.6°F | Resp 24 | Wt 143.6 lb

## 2012-10-03 DIAGNOSIS — C3490 Malignant neoplasm of unspecified part of unspecified bronchus or lung: Secondary | ICD-10-CM

## 2012-10-03 DIAGNOSIS — C61 Malignant neoplasm of prostate: Secondary | ICD-10-CM

## 2012-10-03 NOTE — Progress Notes (Signed)
Radiation Oncology         [phone]) [phone] ________________________________  Name: MARY HOCKEY MRN: [id number]  Date: 10/03/2012  DOB: 12-29-1920  Follow-Up Visit Note  CC: Oliver Barre, MD  Crecencio Mc, MD  Diagnosis:   79 year old gentleman with a complex oncologic history including the following: ----------------------------------------------------------------------------------------------------------- 1. 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 2.4 cm tumor treated with curative, stereotactic body radiotherapy May 25, May 26, June 1, June 3, and February 01, 2009 to 50 Gy in five fractions of 10 Gy on TomoTherapy at   B. 4.6 cm recurrence treated with salvage re-irradiation of the chest for local control/palliation 05/08/2012-05/21/2012 to 30 Gy in 10 fractions on TomoTherapy  C. Now 5.2 cm  2. Prostate cancer with Gleason's 4+4 and a PSA of approximately 6 treated in [place] in 2005  A. Treated with Lupron, external beam radiation and high-dose-rate brachytherapy boost, complicated by a urethral stricture. His PSA nadir was 0.05 in 12/07 and current untreated biochemical recurrence and recent PSA of 21.85 on 09/13/12 -----------------------------------------------------------------------------------------------------------  Interval Since Last Radiation:  4 months  Narrative:  The patient returns today for routine follow-up.  He has some enlargement of his right lower lung mass potentially reflecting tumor re-growth and/or mass-like consolidation with some component of pneumonitis changes.  He also has a progressively rising PSA with some prominent abdominal and retroperitoneal nodes reflecting nodal metastatic disease.             ALLERGIES:  is allergic to iodine and sulfonamide derivatives.  Meds: Current Outpatient Prescriptions  Medication Sig Dispense Refill  . aspirin EC 81 MG tablet Take 81 mg by mouth every morning.       Marland Kitchen b  complex vitamins capsule Take 1 capsule by mouth daily.      . clonazePAM (KLONOPIN) 2 MG tablet Take 2 mg by mouth 2 (two) times daily. scheduled      . dextromethorphan (DELSYM) 30 MG/5ML liquid Take 60 mg by mouth daily as needed.      . fenofibrate (TRICOR) 145 MG tablet Take 1 tablet (145 mg total) by mouth daily.  90 tablet  3  . guaiFENesin (MUCINEX) 600 MG 12 hr tablet Take 600 mg by mouth 2 (two) times daily.      . montelukast (SINGULAIR) 10 MG tablet Take 10 mg by mouth at bedtime.      . pantoprazole (PROTONIX) 40 MG tablet Take 1 tablet (40 mg total) by mouth daily.  30 tablet  1  . predniSONE (DELTASONE) 10 MG tablet 4 tabs for 2 days, then 3 tabs for 2 days, 2 tabs for 2 days, then 1 tab for 2 days, then stop  20 tablet  0  . Probiotic Product (ALIGN) 4 MG CAPS Take 1 capsule by mouth daily.      Marland Kitchen tiotropium (SPIRIVA) 18 MCG inhalation capsule Place 18 mcg into inhaler and inhale daily.        . primidone (MYSOLINE) 50 MG tablet Take 25 mg by mouth at bedtime. Patient son in law states that this will increase to 50mg  next week. The date is unknown.      . triamcinolone (NASACORT) 55 MCG/ACT nasal inhaler Place 2 sprays into the nose as needed. For allergies      . [DISCONTINUED] DICYCLOMINE HCL PO Take 1 tablet by mouth daily.         Physical Findings: The patient is in no acute  distress. Patient is alert and oriented.  weight is 143 lb 9.6 oz (65.137 kg). His oral temperature is 97.6 F (36.4 C). His blood pressure is 107/56 and his pulse is 105. His respiration is 24 and oxygen saturation is 92%. .  No significant changes.  Lab Findings: Lab Results  Component Value Date   WBC 10.6* 09/06/2012   HGB 12.4* 09/06/2012   HCT 38.4* 09/06/2012   MCV 83.2 09/06/2012   PLT 164.0 09/06/2012    @LASTCHEM @  Radiographic Findings: Ct Abdomen Pelvis Wo Contrast  09/26/2012  *RADIOLOGY REPORT*  Clinical Data:  Lung cancer diagnosed 2010, prostate cancer diagnosed 2005, XRT  complete, shortness of breath.  CT CHEST, ABDOMEN AND PELVIS WITHOUT CONTRAST  Technique:  Multidetector CT imaging of the chest, abdomen and pelvis was performed following the standard protocol without IV contrast.  Comparison:  CT chest dated 06/26/2012.  PET CT dated 03/25/2012.  CT CHEST  Findings:  5.2 x 3.9 cm mass-like opacity in the posteromedial right lower lobe (series 2/image 26), previously measuring 5.1 x 3.5 cm.  Surrounding right paramediastinal radiation changes.  Centrilobular emphysematous changes and superimposed chronic interstitial lung disease, likely idiopathic pulmonary fibrosis.  Calcified granulomata and mediastinal/right hilar calcified lymph nodes.  Visualized thyroid is unremarkable.  The heart is top normal in size.  No pericardial effusion.  Mild coronary atherosclerosis.  Atherosclerotic calcifications of the aortic arch.  10 mm short-axis esophageal node (series 2/image 26), previously 7 mm.   11 mm short-axis right retrocrural node (series 2/image 45), unchanged.  Degenerative changes of the thoracic spine.  IMPRESSION: 5.2 cm mass-like opacity in the posterior medial right lower lobe, mildly increased.  Given hypermetabolism on prior PET, this remains worrisome for recurrence.  Small paraesophageal and retrocrural nodes, as described above, stable versus minimally increased.  Emphysematous changes, radiation changes, and chronic interstitial lung disease.  CT ABDOMEN AND PELVIS  Findings:  Unenhanced liver and spleen are notable for numerous calcified granulomata.  Nodular contour of the inferior right hepatic lobe, raising the possibility of cirrhosis.  Pancreas and adrenal glands are unremarkable.  Cholelithiasis, without associated inflammatory changes.  No intrahepatic or extrahepatic ductal dilatation.  Numerous renal cysts of varying sizes and complexity, including a 4.6 x 3.9 cm probable hemorrhagic lateral left lower pole cyst (series 2/image 62), incompletely characterized  in the absence of intravenous contrast.  4 mm nonobstructing right lower pole renal calculus (series 2/image 60).  Additional tiny nonobstructing renal calculi and/or vascular calcifications in the lower poles bilaterally. No hydronephrosis.  No evidence of bowel obstruction.  Extensive colonic diverticulosis, without associated inflammatory changes.  Possible mild eccentric wall thickening of the left rectum (series 2/image 107) is favored to reflect debris/underdistention given lack of hypermetabolism in this region on prior PET.  Fusiform ectasia of the infrarenal abdominal aorta measuring up to 2.7 cm in AP diameter (series 2/image 69).  Extensive atherosclerotic complications of the abdominal aorta and branch vessels.  No abdominopelvic ascites.  Prominent upper abdominal/retroperitoneal lymph nodes, stable versus mildly increased, including: --8 mm short-axis porta hepatic node (series 2/53), unchanged --13 mm short-axis aortocaval node (series 2/image 61), previously 12 mm --14 mm short-axis aortocaval node (series 2/image 73), previously 13 mm --6 mm short-axis right common iliac node (series 2/image 83), unchanged  Prostate is small.  Prior TURP defect.  Bladder is within normal limits.  Degenerative changes of the lumbar spine.  Grade 1 anterolisthesis of L4 on L5.  IMPRESSION: Retroperitoneal lymphadenopathy, as described above,  stable versus mildly increased.  Nodal metastases remain possible.  Additional stable ancillary findings as above.   Original Report Authenticated By: Charline Bills, M.D.    Ct Chest Wo Contrast  09/26/2012  *RADIOLOGY REPORT*  Clinical Data:  Lung cancer diagnosed 2010, prostate cancer diagnosed 2005, XRT complete, shortness of breath.  CT CHEST, ABDOMEN AND PELVIS WITHOUT CONTRAST  Technique:  Multidetector CT imaging of the chest, abdomen and pelvis was performed following the standard protocol without IV contrast.  Comparison:  CT chest dated 06/26/2012.  PET CT dated  03/25/2012.  CT CHEST  Findings:  5.2 x 3.9 cm mass-like opacity in the posteromedial right lower lobe (series 2/image 26), previously measuring 5.1 x 3.5 cm.  Surrounding right paramediastinal radiation changes.  Centrilobular emphysematous changes and superimposed chronic interstitial lung disease, likely idiopathic pulmonary fibrosis.  Calcified granulomata and mediastinal/right hilar calcified lymph nodes.  Visualized thyroid is unremarkable.  The heart is top normal in size.  No pericardial effusion.  Mild coronary atherosclerosis.  Atherosclerotic calcifications of the aortic arch.  10 mm short-axis esophageal node (series 2/image 26), previously 7 mm.   11 mm short-axis right retrocrural node (series 2/image 45), unchanged.  Degenerative changes of the thoracic spine.  IMPRESSION: 5.2 cm mass-like opacity in the posterior medial right lower lobe, mildly increased.  Given hypermetabolism on prior PET, this remains worrisome for recurrence.  Small paraesophageal and retrocrural nodes, as described above, stable versus minimally increased.  Emphysematous changes, radiation changes, and chronic interstitial lung disease.  CT ABDOMEN AND PELVIS  Findings:  Unenhanced liver and spleen are notable for numerous calcified granulomata.  Nodular contour of the inferior right hepatic lobe, raising the possibility of cirrhosis.  Pancreas and adrenal glands are unremarkable.  Cholelithiasis, without associated inflammatory changes.  No intrahepatic or extrahepatic ductal dilatation.  Numerous renal cysts of varying sizes and complexity, including a 4.6 x 3.9 cm probable hemorrhagic lateral left lower pole cyst (series 2/image 62), incompletely characterized in the absence of intravenous contrast.  4 mm nonobstructing right lower pole renal calculus (series 2/image 60).  Additional tiny nonobstructing renal calculi and/or vascular calcifications in the lower poles bilaterally. No hydronephrosis.  No evidence of bowel  obstruction.  Extensive colonic diverticulosis, without associated inflammatory changes.  Possible mild eccentric wall thickening of the left rectum (series 2/image 107) is favored to reflect debris/underdistention given lack of hypermetabolism in this region on prior PET.  Fusiform ectasia of the infrarenal abdominal aorta measuring up to 2.7 cm in AP diameter (series 2/image 69).  Extensive atherosclerotic complications of the abdominal aorta and branch vessels.  No abdominopelvic ascites.  Prominent upper abdominal/retroperitoneal lymph nodes, stable versus mildly increased, including: --8 mm short-axis porta hepatic node (series 2/53), unchanged --13 mm short-axis aortocaval node (series 2/image 61), previously 12 mm --14 mm short-axis aortocaval node (series 2/image 73), previously 13 mm --6 mm short-axis right common iliac node (series 2/image 83), unchanged  Prostate is small.  Prior TURP defect.  Bladder is within normal limits.  Degenerative changes of the lumbar spine.  Grade 1 anterolisthesis of L4 on L5.  IMPRESSION: Retroperitoneal lymphadenopathy, as described above, stable versus mildly increased.  Nodal metastases remain possible.  Additional stable ancillary findings as above.   Original Report Authenticated By: Charline Bills, M.D.    Impression:  The patient is recovering from the effects of radiation.  His lung CT does show enlargement of the opacity in his right lower lung.  This could certainly reflect a component  of ongoing progressively recurrent tumor despite salvage irradiation, or it could reflect mass-like fibrosis and pneumonitic radiation changes.  In all likelihood, both etiologies are probably contributing to the appearance.  Given the absence of treatment options for recurrent tumor, I am supportive of the current plan to optimize his pulmonary function and monitor his chest for new disease/complications in 3 months.  With respect to his prostate cancer he appears to have a  rising PSA of greater than 20 and suspicious adenopathy.  The optimal timing of salvage hormone therapy can be debated.  Also, the potential risks of androgen deprivation on this frail patient need to be carefully considered.  I defer to Dr. Laverle Patter about how to proceed there after the patient has a bone scan.  Plan:  Repeat Chest CT in 3 months and follow-up a few days later.  _____________________________________  Artist Pais. Kathrynn Running, M.D.

## 2012-10-03 NOTE — Progress Notes (Signed)
Patient here follow up s/p rad tx lung=05/08/12-05/21/12 Ambulating with walker  And oxygen nasal canula 2 liters, patient alert,oriented x3, switched to our oxygen tank from his portaable, placed on 3 liters, he thought he had his on 3, short of breath, coughs up yellow pheglm, new meds mucinex, restarted prednisone pack  This past Saturday, has flutter valve to use bid, and delsym at night cough medicine otc, no c/o pain, eating well, no c/o nausea, bowels moving good, weakness still 12:11 PM

## 2012-10-04 ENCOUNTER — Encounter (HOSPITAL_COMMUNITY)
Admission: RE | Admit: 2012-10-04 | Discharge: 2012-10-04 | Disposition: A | Discharge status: Home / Self Care | Payer: Medicare Other | Source: Ambulatory Visit | Attending: Urology | Admitting: Urology

## 2012-10-04 DIAGNOSIS — C61 Malignant neoplasm of prostate: Secondary | ICD-10-CM | POA: Insufficient documentation

## 2012-10-12 ENCOUNTER — Other Ambulatory Visit: Payer: Self-pay

## 2012-11-01 ENCOUNTER — Ambulatory Visit: Payer: Medicare Other | Admitting: Emergency Medicine

## 2012-11-08 ENCOUNTER — Telehealth: Payer: Self-pay

## 2012-11-08 MED ORDER — PANTOPRAZOLE SODIUM 40 MG PO TBEC: 40.0000 mg | DELAYED_RELEASE_TABLET | Freq: Every day | ORAL | Status: AC

## 2012-11-08 NOTE — Telephone Encounter (Signed)
Refilled pantoprazole.

## 2012-11-11 ENCOUNTER — Encounter: Payer: Self-pay | Admitting: Neurology

## 2012-11-11 ENCOUNTER — Ambulatory Visit (INDEPENDENT_AMBULATORY_CARE_PROVIDER_SITE_OTHER): Payer: Medicare Other | Admitting: Neurology

## 2012-11-11 VITALS — BP 106/62 | HR 96 | Temp 97.7°F | Resp 22 | Wt 141.0 lb

## 2012-11-11 NOTE — Patient Instructions (Signed)
1.  Primidone - 50 mg - 1/2 tablet twice a day 2.  Use your walker at all times 3.  Get your son in law to get those taxes done :) 4.  See you in 5 months

## 2012-11-11 NOTE — Progress Notes (Signed)
Roy [name] was seen today in the movement disorders clinic for f/u.  This patient is accompanied in the office by his child who supplements the history.   The first symptom(s) the patient noticed was tremor that started when he was 79 y/o.  He noted tremor in both hands first, but about 1 year later, it involved the voice.  Not long thereafter the head was also involved. It has gotten worse with time, primarily the voice.    He states that he is frustrated b/c people cannot understand him on the phone.   He reports that balance was okay until 2011.  He sustained a fall in 2011 and balance has been poor since.  PT helps, and he has been under 3 courses of PT.  He has not gone to PT in over a year, but goes to the gym 3 times per week.   He was recently told not to go to the gym b/c of poor lung function. Marland Kitchen  He uses OT devices in the shower (seat) to help him.  He cannot button clothing.    He cannot write because of tremor and this is one of the bothersome symptoms.  He is on klonopin now and has been on it for about 5 years and it helped initially but he is not sure it is helping now.  He takes 2mg  bid.  He has been on propranolol in the past but it did not help.  He has trouble getting in his hearing aids and doing things that require fine motor coordination.  In the past, alcohol made him "steady as a rock."  He has not noticed an effect with caffeine.  There is a strong family history of tremor on his mothers side (mother, uncle, aunt, cousin).  He has never had botox injections for the voice.  He has some urinary incontinence since a procedure for prostate CA.  When he had brachytherapy, one of the radiation seeds went through the urethra and scarred it and he had problems after that.    11/11/2012 I reviewed the patient's rather extensive records and events since last visit.  He was seen in the emergency room in November, 2013 after a fall.  A CT of the head was done and was negative.  He presented  back to the hospital on 08/27/2012 because of hypoxia.  Several days later, he ended up in the emergency room after a fall.  This time, the CT of the head revealed a fracture through the maxillary sinus.  His daughter states that the patient had gotten up in the middle of the night and tried to reach something on a very top shelf and ended up falling backward.  The patient does have a history of both lung and prostate cancer.  His PSA had been increasing and he had some suspicious adenopathy, so he had a bone scan on 10/04/2012 that was unremarkable.  The patient has wanted to be considered for DBS, but I do not think that he is a candidate because of his multiple other medical problems.  Last visit, I started the patient on primidone for the tremor.  The patient reports that he did not take it for long as he thought it was causing "loopiness" and drowsiness but he was also on prednisone and had the hospitalizations above.  He restarted it at only 25 mg a few nights ago and noted a vast improvement in tremor.  For the last few days, he states that he  has been able to cut his own food.  He has not noticed any cognitive dulling.  PREVIOUS MEDICATIONS:  Propranolol  ALLERGIES:   Allergies  Allergen Reactions  . Iodine Nausea Only  . Sulfonamide Derivatives Nausea And Vomiting    CURRENT MEDICATIONS:  Current Outpatient Prescriptions on File Prior to Visit  Medication Sig Dispense Refill  . aspirin EC 81 MG tablet Take 81 mg by mouth every morning.       Marland Kitchen b complex vitamins capsule Take 1 capsule by mouth daily.      . clonazePAM (KLONOPIN) 2 MG tablet Take 2 mg by mouth 2 (two) times daily. scheduled      . dextromethorphan (DELSYM) 30 MG/5ML liquid Take 60 mg by mouth daily as needed.      . fenofibrate (TRICOR) 145 MG tablet Take 1 tablet (145 mg total) by mouth daily.  90 tablet  3  . guaiFENesin (MUCINEX) 600 MG 12 hr tablet Take 600 mg by mouth 2 (two) times daily.      . montelukast  (SINGULAIR) 10 MG tablet Take 10 mg by mouth at bedtime.      . pantoprazole (PROTONIX) 40 MG tablet Take 1 tablet (40 mg total) by mouth daily.  30 tablet  11  . predniSONE (DELTASONE) 10 MG tablet 4 tabs for 2 days, then 3 tabs for 2 days, 2 tabs for 2 days, then 1 tab for 2 days, then stop  20 tablet  0  . primidone (MYSOLINE) 50 MG tablet Take 25 mg by mouth at bedtime. Patient son in law states that this will increase to 50mg  next week. The date is unknown.      . Probiotic Product (ALIGN) 4 MG CAPS Take 1 capsule by mouth daily.      Marland Kitchen tiotropium (SPIRIVA) 18 MCG inhalation capsule Place 18 mcg into inhaler and inhale daily.        Marland Kitchen triamcinolone (NASACORT) 55 MCG/ACT nasal inhaler Place 2 sprays into the nose as needed. For allergies      . [DISCONTINUED] DICYCLOMINE HCL PO Take 1 tablet by mouth daily.        No current facility-administered medications on file prior to visit.    PAST MEDICAL HISTORY:   Past Medical History  Diagnosis Date  . Anxiety   . Asthma   . Depression   . Kidney stones     hx of   . Pulmonary fibrosis   . Hx of radiation therapy may 2010 thru june 2010    5 tx, stereotactic radiotherapy  . Tremor, essential   . COPD (chronic obstructive pulmonary disease)   . DM (diabetes mellitus)   . Normocytic anemia   . Diverticulosis   . Allergic rhinitis   . Hyperlipidemia   . Adenomatous colon polyp   . Chronic left shoulder pain   . Chronic constipation   . Hemorrhoids   . Cellulitis of leg, right   . Edema   . Radiation May/June 2010    stereotactic body radiotherapy  . Urethral stricture 04/18/2012    Dr mcdermot/urology  . GERD (gastroesophageal reflux disease)   . Hx of radiation therapy 05/08/12 -05/21/12    RLL lung  . Zenker's diverticulum   . Prostate cancer 2005    gleason 4+4=8  . Lung cancer 2010, 2013    RLL, 2013 recurrent     PAST SURGICAL HISTORY:   Past Surgical History  Procedure Laterality Date  . Appendectomy    .  Hdr  brachytherapy implant for prostate cancer  2005  . Cataract extraction, bilateral    . Internal urethrotomy  12/09/2008  . Colonoscopy  October 2008    Diverticulosis, hemorrhoids  . Tonsillectomy  1943  . Ct biopsy  04/11/2012    invasive squamous cell carcinoma    SOCIAL HISTORY:   History   Social History  . Marital Status: Widowed    Spouse Name: N/A    Number of Children: 1  . Years of Education: N/A   Occupational History  . CPA     Retired   . retired     Company secretary   Social History Main Topics  . Smoking status: Former Smoker -- 1.00 packs/day for 35 years    Quit date: 08/14/1989  . Smokeless tobacco: Never Used  . Alcohol Use: No     Comment: none in 10 years  . Drug Use: No  . Sexually Active: Not on file   Other Topics Concern  . Not on file   Social History Narrative   Daily caffeine     FAMILY HISTORY:   Family Status  Relation Status Death Age  . Mother Deceased     MVA  . Father Deceased     bladder CA  . Brother Deceased     lung CA  . Brother Deceased     CA  . Sister Deceased     CA  . Child Alive     daughter, anorexia    ROS:  A complete 10 system review of systems was obtained and was unremarkable apart from what is mentioned above.  PHYSICAL EXAMINATION:    VITALS:   There were no vitals filed for this visit.  GEN:  The patient appears stated age and is in NAD.   Neurological examination:  Orientation: The patient is alert and oriented x3. Fund of knowledge is appropriate.  Recent and remote memory are intact.  Attention and concentration are normal.    Able to name objects and repeat phrases. Cranial nerves: There is good facial symmetry. Pupils are equal round and reactive to light bilaterally. Fundoscopic exam reveals clear margins bilaterally. Extraocular muscles are intact. The visual fields are full to confrontational testing. The patient has tremor of the voice and significant spasmodic dysphonia. Soft palate rises  symmetrically and there is no tongue deviation. Hearing is decreased to conversational tone. Sensation: Sensation is intact to light throughout (facial, trunk, extremities). There is no extinction with double simultaneous stimulation. There is no sensory dermatomal level identified. Motor: Strength is 5/5 in the bilateral upper and lower extremities.  However, he cannot abduct the left shoulder greater than 90 degrees b/c of prior rotator cuff injury. Shoulder shrug is equal and symmetric.  There is no pronator drift. Deep tendon reflexes: Deep tendon reflexes are 0/4 at the bilateral biceps, triceps, brachioradialis, patella and achilles. Plantar responses are downgoing bilaterally.  Movement examination: Tone: There is normal tone in the bilateral upper extremities.  The tone in the lower extremities is normal.  Abnormal movements: There is tremor in the head in the "yes" direction.  There is tremor of the hands/arms but more so if the patients arms are held antigravity and proximal. It is improved compared to last visit.   No asterixis.  He has trouble with archimedes spirals bilaterally. Coordination:  There is no decremation with RAM's. Gait and Station: The patient has  difficulty arising out of a deep-seated chair without the use of the hands.  The patient's stride length is normal but there is postural instability..      ASSESSMENT:  1.  Essential Tremor.  This is evidenced by the longstanding nature, strong fam hx, and improvement with alcohol.  I see no evidence of PD in this patient.  There is no rigidity or bradykinesia.  2. Postural instability.  About 30% of pts with significant ET will have balance difficulties as well.  He may have some PN as well from the lung CA.  I see no evidence of LEMS.   PLAN:  1.  I talked to the patient about the fact that spasmodic dysphonia is very unresponsive to to medications.  The only effective treatment is Botox to the vocal cords.  There is a risk of  dysphagia with this.  He does not wish to be referred to ENT for consideration for this right now and his daughter agrees as he has a hx of dysphagia with a zenkers diverticulum.   2.  I agree with the clonazepam and at this point in time, I would not change that. 3.  We decided to cautiously continue primidone.  I am going to have him take, 50 mg, 1/2 bid.  Risks, benefits, side effects and alternative therapies were discussed.  The opportunity to ask questions was given and they were answered to the best of my ability.  The patient expressed understanding and willingness to follow the outlined treatment protocols. 4.  We talked about the importance of safety.  He needs to use his walker at all times.  If he begins to have falls again (has not fallen in over 2 months), then we perhaps need to consider EMG testing.  His daughter, the patient and I all agreed today that this was unnecessary right now. 4.  He will followup in 5 months, sooner should new neurologic issues arise.

## 2012-11-21 ENCOUNTER — Ambulatory Visit (INDEPENDENT_AMBULATORY_CARE_PROVIDER_SITE_OTHER): Payer: Medicare Other | Admitting: Emergency Medicine

## 2012-11-21 ENCOUNTER — Encounter: Payer: Self-pay | Admitting: Emergency Medicine

## 2012-11-21 ENCOUNTER — Telehealth: Payer: Self-pay | Admitting: *Deleted

## 2012-11-21 VITALS — BP 118/68 | HR 105 | Temp 97.4°F | Ht 66.0 in | Wt 143.4 lb

## 2012-11-21 DIAGNOSIS — C3490 Malignant neoplasm of unspecified part of unspecified bronchus or lung: Secondary | ICD-10-CM

## 2012-11-21 DIAGNOSIS — J449 Chronic obstructive pulmonary disease, unspecified: Secondary | ICD-10-CM

## 2012-11-21 DIAGNOSIS — J9600 Acute respiratory failure, unspecified whether with hypoxia or hypercapnia: Secondary | ICD-10-CM

## 2012-11-21 DIAGNOSIS — J9691 Respiratory failure, unspecified with hypoxia: Secondary | ICD-10-CM

## 2012-11-21 DIAGNOSIS — J8410 Pulmonary fibrosis, unspecified: Secondary | ICD-10-CM

## 2012-11-21 NOTE — Progress Notes (Signed)
History of Present Illness:  79 yo former smoker with hx COPD, prostatic CA s/p resection 2005, ureteral strictures. Evaluated in ED 3/10 for abd pain, suspected from full bladder, some tremor and dysarthria. Underwent MRI abdomen that revealed RLL mass. Subsequent PET/CT also shows subpleural ILD. Has albuterol, rarely requires. uses Spiriva daily.   12/31/08 -- Pt returns to [facility] today s/p needle bx of his RUL mass. The bx shows non-small cell lung CA, with features suggestive of adenoCA. Has completed swallowing eval for his ILD w/u.   ROV 10/27/09 -- Returns today to tell me that he is having more trouble swalowing. Food sems to go down OK, but he has consistent coughing with thin liquids. His speech Rx eval in May 10 recommended thin liquids Completed radiation in June 10. Followed by Dr Kathrynn Running with serial scans, next one to be in May 11. Wt stable. Some stable exertional SOB. No exacerbations of COPD in last year.   ROV 11/30/09 -- follows up to discuss swallow eval results. Tells me today he has been more fatigued, much more tired since last visit. His diet has "gone to hell" since his swallowing eval showed that he needs nectar thick liquids, has silent aspiration of large bolused solids.   ROV 02/16/10 -- returns for f/u of his ILD from aspiration, COPD, adenoCA s/p XRT. Since last visit has had CT scan in 5/11 that showed some scarring in the region of his lung mass. Last time we changed back to his usual diet because the modified diet was intolerable. His wt is up some 5 lbs. Still trying to use thickener in his drinks.   ROV 06/29/10 -- hx chronic aspiration and ILD, adenoCA s/p XRT. Since last visit he has several falls, still lives alone. Tells me that he is feeling OK, but the thickened diet is making him constipated. Miralax was started about 4 days ago. Was just in [place] for rehab after discharged for a fall. No aspiration symptoms, no cough with taking by mouth as long as he thinckens. Spiriva  was substituted with atrovent briefly, now back on Spiriva - missed it.   ROV 05/28/12 -- hx chronic aspiration and ILD, adenoCA s/p XRT. Returns for eval progressive dyspnea. 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 Comparison of PET 7/22 to  CT scan from 01/2011  And 07/2011 show stable ILD changes. He tells me that minimal exertion is now limiting him - SOB after 50 - 60 feet walking. 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 Was confused in late October, possibly due to the XRT. Suffered a fall 11/7 >> CT scan head and C-spine OK. Has been having trouble getting his O2 clarified - was initially on eclipse, then to concentrator + liquid o2 and pulsed O2. Currently set on 3L/min pulsed. Feels his MS is back to normal.   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 Hx of Zenker's Diverticulum.  Complains of rattling cough x 4 weeks. Feels breathing has gotten worse. Has had lots of congestion since [place]. Reports difficulty with sleeping because he can't breathe due to thick congestion and rattle  in his chest.  Admitted 08/26/12 -08/29/12 for Acute on chronic resp failure and SOB: secondary to interstitial fibrosis, mild exacerbation of COPD and radiation pneumonitis. Mild bronchitic to bronchiectasis , tx w/ abx, steroids CE'z neg X 3, V/Q scan w/o PE.Fredna Dow he got some better but cough has not resolved.  Had CT chest 09/26/12 >5.2 cm mass-like opacity in the posterior medial right lower lobe,  mildly increased Has follow up with Dr. Kathrynn Running next week to discuss.  He denies any hemoptysis, orthopnea, PND, increased  leg swelling, fever, or chest pain.  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 His PSA is rising, underwent bone scan > no mets seen. CT scan abd > stable LAD.  Minimal cough. No overt aspiration. His O2 is on 3L/min rest. Still on Spiriva.    CT CHEST  09/26/12 -  Findings: 5.2 x 3.9 cm mass-like opacity in the posteromedial  right lower lobe (series 2/image 26), previously measuring 5.1 x  3.5 cm.  Surrounding right paramediastinal radiation changes. Centrilobular  emphysematous changes and superimposed chronic interstitial lung  disease, likely idiopathic pulmonary fibrosis.  Calcified granulomata and mediastinal/right hilar calcified lymph  nodes.  Visualized thyroid is unremarkable.  The heart is top normal in size. No pericardial effusion. Mild  coronary atherosclerosis. Atherosclerotic calcifications of the  aortic arch.  10 mm short-axis esophageal node (series 2/image 26), previously 7  mm. 11 mm short-axis right retrocrural node (series 2/image 45),  unchanged.  Degenerative changes of the thoracic spine.  IMPRESSION:  5.2 cm mass-like opacity in the posterior medial right lower lobe,  mildly increased. Given hypermetabolism on prior PET, this remains  worrisome for recurrence.  Small paraesophageal and retrocrural nodes, as described above,  stable versus minimally increased.  Emphysematous changes, radiation changes, and chronic interstitial  lung disease   OBJ:  Filed Vitals:   11/21/12 1110  BP: 118/68  Pulse: 105  Temp: 97.4 F (36.3 C)   Gen: Pleasant, frail /elderly  in no distress,  normal affect  ENT: No lesions,  mouth clear,  oropharynx clear, no postnasal drip  Neck: No JVD, no TMG, no carotid bruits  Lungs: No use of accessory muscles, bilateral insp crackles all fields  Cardiovascular: RRR, heart sounds normal, no  murmur or gallops, no peripheral edema  Musculoskeletal: No deformities, no cyanosis, significant + clubbing  Neuro: alert, significant tremor, good strength.   Skin: Warm, no lesions or rashes  INTERSTITIAL LUNG DISEASE - swallow precautions - follow CXR and clinical status - increase O2 to 4L/min w exertion; work on getting most portable system.   COPD - continue spiriva  NEOPLASM, MALIGNANT, LUNG, NONSMALL CELL - following w Dr Kathrynn Running

## 2012-11-21 NOTE — Patient Instructions (Addendum)
Please continue your Spiriva Wear your oxygen at all times, 3L/min at rest and increase to 4L/min with exertion.  We will work on getting you a more portable O2 system Follow with Dr Delton Coombes in 4 months or sooner if you have any problems.

## 2012-11-21 NOTE — Telephone Encounter (Signed)
Left msg on triage requesting call bck concerning father medications. Called daughter back no answer LMOM RTC...Raechel Chute

## 2012-11-22 ENCOUNTER — Telehealth: Payer: Self-pay | Admitting: Internal Medicine

## 2012-11-22 MED ORDER — FENOFIBRATE 145 MG PO TABS: 145.0000 mg | ORAL_TABLET | Freq: Every day | ORAL | Status: AC

## 2012-11-22 NOTE — Telephone Encounter (Signed)
Patient is out of his tricore, this usually comes from [facility] but they need a script sent to [facility] [place] since he is out

## 2012-11-22 NOTE — Telephone Encounter (Signed)
Sent in prescription requested to [facility] called the patient to inform.

## 2012-11-26 NOTE — Assessment & Plan Note (Signed)
-   following w Dr Kathrynn Running

## 2012-11-26 NOTE — Assessment & Plan Note (Addendum)
-   swallow precautions - follow CXR and clinical status - increase O2 to 4L/min w exertion; work on getting most portable system.

## 2012-11-26 NOTE — Assessment & Plan Note (Signed)
continue spiriva 

## 2012-12-03 ENCOUNTER — Other Ambulatory Visit: Payer: Self-pay | Admitting: Internal Medicine

## 2012-12-05 ENCOUNTER — Ambulatory Visit: Payer: Medicare Other | Admitting: Internal Medicine

## 2012-12-09 ENCOUNTER — Ambulatory Visit (INDEPENDENT_AMBULATORY_CARE_PROVIDER_SITE_OTHER): Payer: Medicare Other | Admitting: Internal Medicine

## 2012-12-09 ENCOUNTER — Encounter: Payer: Self-pay | Admitting: Internal Medicine

## 2012-12-09 VITALS — BP 132/64 | HR 109 | Temp 97.6°F | Wt 138.0 lb

## 2012-12-09 DIAGNOSIS — N289 Disorder of kidney and ureter, unspecified: Secondary | ICD-10-CM

## 2012-12-09 DIAGNOSIS — D649 Anemia, unspecified: Secondary | ICD-10-CM

## 2012-12-09 DIAGNOSIS — E785 Hyperlipidemia, unspecified: Secondary | ICD-10-CM

## 2012-12-09 DIAGNOSIS — E119 Type 2 diabetes mellitus without complications: Secondary | ICD-10-CM

## 2012-12-09 MED ORDER — MONTELUKAST SODIUM 10 MG PO TABS: ORAL_TABLET | ORAL | Status: DC

## 2012-12-09 NOTE — Assessment & Plan Note (Signed)
stable overall by history and exam, recent data reviewed with pt, and pt to continue medical treatment as before,  to f/u any worsening symptoms or concerns Lab Results  Component Value Date   LDLCALC 65 09/06/2012

## 2012-12-09 NOTE — Assessment & Plan Note (Signed)
Overall volume stable, The current medical regimen is effective;  continue present plan and medications. Lab Results  Component Value Date   WBC 10.6* 09/06/2012   HGB 12.4* 09/06/2012   HCT 38.4* 09/06/2012   PLT 164.0 09/06/2012   GLUCOSE 108* 09/06/2012   CHOL 130 09/06/2012   TRIG 151.0* 09/06/2012   HDL 35.00* 09/06/2012   LDLCALC 65 09/06/2012   ALT 12 08/28/2012   AST 21 08/28/2012   NA 140 09/06/2012   K 4.0 09/06/2012   CL 98 09/06/2012   CREATININE 1.2 09/06/2012   BUN 32* 09/06/2012   CO2 34* 09/06/2012   TSH 1.411 08/27/2012   INR 1.19 08/27/2012   HGBA1C 6.7* 09/06/2012

## 2012-12-09 NOTE — Progress Notes (Signed)
Subjective:    Patient ID: Roy [name], male    DOB: 1921/06/18, 79 y.o.   MRN: [id number]  HPI  Here to f/u; overall doing ok,  Pt denies chest pain, increased sob or doe, wheezing, orthopnea, PND, increased LE swelling, palpitations, dizziness or syncope, though overall states breathing worse in the past yr, followed per pulm closely.  Pt denies polydipsia, polyuria, or low sugar symptoms such as weakness or confusion improved with po intake.  Pt denies new neurological symptoms such as new headache, or facial or extremity weakness or numbness.   Pt states overall good compliance with meds, has been trying to follow lower cholesterol, diabetic diet, with wt overall stable. Has some intermittent mild LLE swelling, better in the am, worse later in the day.  Has occas electric feeling/stinging to toes of left foot.  No other pain, ulcer. No claudication.   Denies worsening depressive symptoms, suicidal ideation, or panic. Past Medical History  Diagnosis Date  . Anxiety   . Asthma   . Depression   . Kidney stones     hx of   . Pulmonary fibrosis   . Hx of radiation therapy may 2010 thru june 2010    5 tx, stereotactic radiotherapy  . Tremor, essential   . COPD (chronic obstructive pulmonary disease)   . DM (diabetes mellitus)   . Normocytic anemia   . Diverticulosis   . Allergic rhinitis   . Hyperlipidemia   . Adenomatous colon polyp   . Chronic left shoulder pain   . Chronic constipation   . Hemorrhoids   . Cellulitis of leg, right   . Edema   . Radiation May/June 2010    stereotactic body radiotherapy  . Urethral stricture 04/18/2012    Dr mcdermot/urology  . GERD (gastroesophageal reflux disease)   . Hx of radiation therapy 05/08/12 -05/21/12    RLL lung  . Zenker's diverticulum   . Prostate cancer 2005    gleason 4+4=8  . Lung cancer 2010, 2013    RLL, 2013 recurrent    Past Surgical History  Procedure Laterality Date  . Appendectomy    . Hdr brachytherapy implant for  prostate cancer  2005  . Cataract extraction, bilateral    . Internal urethrotomy  12/09/2008  . Colonoscopy  October 2008    Diverticulosis, hemorrhoids  . Tonsillectomy  1943  . Ct biopsy  04/11/2012    invasive squamous cell carcinoma    reports that he quit smoking about 23 years ago. He has never used smokeless tobacco. He reports that he does not drink alcohol or use illicit drugs. family history includes Bladder Cancer in his father; Colon cancer in his sister; Heart disease in his brother; Lung cancer in his brother; and Prostate cancer in his brother. Allergies  Allergen Reactions  . Iodine Nausea Only  . Sulfonamide Derivatives Nausea And Vomiting   Current Outpatient Prescriptions on File Prior to Visit  Medication Sig Dispense Refill  . aspirin EC 81 MG tablet Take 81 mg by mouth every morning.       Marland Kitchen b complex vitamins capsule Take 1 capsule by mouth daily.      . clonazePAM (KLONOPIN) 2 MG tablet Take 2 mg by mouth 2 (two) times daily. scheduled      . fenofibrate (TRICOR) 145 MG tablet Take 1 tablet (145 mg total) by mouth daily.  90 tablet  3  . montelukast (SINGULAIR) 10 MG tablet TAKE 1 TABLET BY MOUTH AT BEDTIME  90 tablet  1  . pantoprazole (PROTONIX) 40 MG tablet Take 1 tablet (40 mg total) by mouth daily.  30 tablet  11  . primidone (MYSOLINE) 50 MG tablet Take 25 mg by mouth at bedtime. 1/2 in AM and 1/2 tablet in PM      . Probiotic Product (ALIGN) 4 MG CAPS Take 1 capsule by mouth daily.      Marland Kitchen tiotropium (SPIRIVA) 18 MCG inhalation capsule Place 18 mcg into inhaler and inhale daily.        Marland Kitchen triamcinolone (NASACORT) 55 MCG/ACT nasal inhaler Place 2 sprays into the nose as needed. For allergies      . [DISCONTINUED] DICYCLOMINE HCL PO Take 1 tablet by mouth daily.        No current facility-administered medications on file prior to visit.   Review of Systems  Constitutional: Negative for unexpected weight change, or unusual diaphoresis  HENT: Negative for  tinnitus.   Eyes: Negative for photophobia and visual disturbance.  Respiratory: Negative for choking and stridor.   Gastrointestinal: Negative for vomiting and blood in stool.  Genitourinary: Negative for hematuria and decreased urine volume.  Musculoskeletal: Negative for acute joint swelling Skin: Negative for color change and wound.  Neurological: Negative for tremors and numbness other than noted  Psychiatric/Behavioral: Negative for decreased concentration or  hyperactivity.       Objective:   Physical Exam BP 132/64  Pulse 109  Temp(Src) 97.6 F (36.4 C) (Oral)  Wt 138 lb (62.596 kg)  BMI 22.28 kg/m2  SpO2 90% VS noted,  Constitutional: Pt appears well-developed and well-nourished.  HENT: Head: NCAT.  Right Ear: External ear normal.  Left Ear: External ear normal.  Eyes: Conjunctivae and EOM are normal. Pupils are equal, round, and reactive to light.  Neck: Normal range of motion. Neck supple.  Cardiovascular: Normal rate and regular rhythm.   Pulmonary/Chest: Effort normal and breath sounds decreased bilat with right > left dry rales.  Abd:  Soft, NT, non-distended, + BS Neurological: Pt is alert. Not confused , has ongoing vocal tremor Skin: Skin is warm. No erythema.  Psychiatric: Pt behavior is normal. Thought content normal.     Assessment & Plan:

## 2012-12-09 NOTE — Patient Instructions (Addendum)
Please continue all other medications as before, and refills have been done if requested. No need for changes today Please continue your efforts at being more active, low cholesterol diet, and weight control as you do Please keep your appointments with your specialists as you have planned Thank you for enrolling in MyChart. Please follow the instructions below to securely access your online medical record. MyChart allows you to send messages to your doctor, view your test results, renew your prescriptions, schedule appointments, and more.  To Log into MyChart, please go to [email or web address], and your Username is: [email or web address] (password [email or web address])  Please return in 6 months

## 2012-12-09 NOTE — Assessment & Plan Note (Signed)
stable overall by history and exam, recent data reviewed with pt, and pt to continue medical treatment as before,  to f/u any worsening symptoms or concerns ble Lab Results  Component Value Date   WBC 10.6* 09/06/2012   HGB 12.4* 09/06/2012   HCT 38.4* 09/06/2012   MCV 83.2 09/06/2012   PLT 164.0 09/06/2012

## 2012-12-09 NOTE — Assessment & Plan Note (Signed)
stable overall by history and exam, recent data reviewed with pt, and pt to continue medical treatment as before,  to f/u any worsening symptoms or concerns Lab Results  Component Value Date   HGBA1C 6.7* 09/06/2012

## 2012-12-26 ENCOUNTER — Encounter: Payer: Self-pay | Admitting: Adult Health

## 2012-12-26 ENCOUNTER — Ambulatory Visit (INDEPENDENT_AMBULATORY_CARE_PROVIDER_SITE_OTHER): Payer: Medicare Other | Admitting: Adult Health

## 2012-12-26 VITALS — BP 90/60 | HR 105 | Temp 99.3°F | Ht 66.0 in | Wt 137.4 lb

## 2012-12-26 DIAGNOSIS — J449 Chronic obstructive pulmonary disease, unspecified: Secondary | ICD-10-CM

## 2012-12-26 MED ORDER — AMOXICILLIN-POT CLAVULANATE 875-125 MG PO TABS: 1.0000 | ORAL_TABLET | Freq: Two times a day (BID) | ORAL | Status: DC

## 2012-12-26 NOTE — Progress Notes (Signed)
History of Present Illness:  79 yo former smoker with hx COPD, prostatic CA s/p resection 2005, ureteral strictures. Evaluated in ED 3/10 for abd pain, suspected from full bladder, some tremor and dysarthria. Underwent MRI abdomen that revealed RLL mass. Subsequent PET/CT also shows subpleural ILD. Has albuterol, rarely requires. uses Spiriva daily.   12/31/08 -- Pt returns to [facility] today s/p needle bx of his RUL mass. The bx shows non-small cell lung CA, with features suggestive of adenoCA. Has completed swallowing eval for his ILD w/u.   ROV 10/27/09 -- Returns today to tell me that he is having more trouble swalowing. Food sems to go down OK, but he has consistent coughing with thin liquids. His speech Rx eval in May 10 recommended thin liquids Completed radiation in June 10. Followed by Dr Kathrynn Running with serial scans, next one to be in May 11. Wt stable. Some stable exertional SOB. No exacerbations of COPD in last year.   ROV 11/30/09 -- follows up to discuss swallow eval results. Tells me today he has been more fatigued, much more tired since last visit. His diet has "gone to hell" since his swallowing eval showed that he needs nectar thick liquids, has silent aspiration of large bolused solids.   ROV 02/16/10 -- returns for f/u of his ILD from aspiration, COPD, adenoCA s/p XRT. Since last visit has had CT scan in 5/11 that showed some scarring in the region of his lung mass. Last time we changed back to his usual diet because the modified diet was intolerable. His wt is up some 5 lbs. Still trying to use thickener in his drinks.   ROV 06/29/10 -- hx chronic aspiration and ILD, adenoCA s/p XRT. Since last visit he has several falls, still lives alone. Tells me that he is feeling OK, but the thickened diet is making him constipated. Miralax was started about 4 days ago. Was just in [place] for rehab after discharged for a fall. No aspiration symptoms, no cough with taking by mouth as long as he thinckens. Spiriva  was substituted with atrovent briefly, now back on Spiriva - missed it.   ROV 05/28/12 -- hx chronic aspiration and ILD, adenoCA s/p XRT. Returns for eval progressive dyspnea. 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 Comparison of PET 7/22 to  CT scan from 01/2011  And 07/2011 show stable ILD changes. He tells me that minimal exertion is now limiting him - SOB after 50 - 60 feet walking. 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 Was confused in late October, possibly due to the XRT. Suffered a fall 11/7 >> CT scan head and C-spine OK. Has been having trouble getting his O2 clarified - was initially on eclipse, then to concentrator + liquid o2 and pulsed O2. Currently set on 3L/min pulsed. Feels his MS is back to normal.   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 Hx of Zenker's Diverticulum.  Complains of rattling cough x 4 weeks. Feels breathing has gotten worse. Has had lots of congestion since [place]. Reports difficulty with sleeping because he can't breathe due to thick congestion and rattle  in his chest.  Admitted 08/26/12 -08/29/12 for Acute on chronic resp failure and SOB: secondary to interstitial fibrosis, mild exacerbation of COPD and radiation pneumonitis. Mild bronchitic to bronchiectasis , tx w/ abx, steroids CE'z neg X 3, V/Q scan w/o PE.Fredna Dow he got some better but cough has not resolved.  Had CT chest 09/26/12 >5.2 cm mass-like opacity in the posterior medial right lower lobe,  mildly increased Has follow up with Dr. Kathrynn Running next week to discuss.  He denies any hemoptysis, orthopnea, PND, increased  leg swelling, fever, or chest pain.  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 His PSA is rising, underwent bone scan > no mets seen. CT scan abd > stable LAD.  Minimal cough. No overt aspiration. His O2 is on 3L/min rest. Still on Spiriva.    CT CHEST  09/26/12 -  Findings: 5.2 x 3.9 cm mass-like opacity in the posteromedial  right lower lobe (series 2/image 26), previously measuring 5.1 x  3.5 cm.  Surrounding right paramediastinal radiation changes. Centrilobular  emphysematous changes and superimposed chronic interstitial lung  disease, likely idiopathic pulmonary fibrosis.  Calcified granulomata and mediastinal/right hilar calcified lymph  nodes.  Visualized thyroid is unremarkable.  The heart is top normal in size. No pericardial effusion. Mild  coronary atherosclerosis. Atherosclerotic calcifications of the  aortic arch.  10 mm short-axis esophageal node (series 2/image 26), previously 7  mm. 11 mm short-axis right retrocrural node (series 2/image 45),  unchanged.  Degenerative changes of the thoracic spine.  IMPRESSION:  5.2 cm mass-like opacity in the posterior medial right lower lobe,  mildly increased. Given hypermetabolism on prior PET, this remains  worrisome for recurrence.  Small paraesophageal and retrocrural nodes, as described above,  stable versus minimally increased.  Emphysematous changes, radiation changes, and chronic interstitial  lung disease   12/26/2012 Acute OV  Complains of prod cough with green mucus x1 weeks, denies increased SOB, wheezing, tightness. using mucinex, delsym, flutter.  Over last week -cough and congestion have worsened.  He is accompanied by his daughter today  Whom he lives with.  Difficult to get up mucus as it is thick despite using flutter valve.  No fever, chest pain , edema, orthopnea, abd pain or n/v.   Appetite is good. No weight loss.  He has upcoming CT chest next week w/ follow up with Radiation oncology specialist.    OBJCeasar Mons Vitals:   12/26/12 1148  BP: 90/60  Pulse: 105  Temp: 99.3 F (37.4 C)   Gen: Pleasant, frail /elderly  in no distress,  normal affect  ENT: No lesions,  mouth clear,  oropharynx clear, no postnasal drip  Neck: No JVD, no TMG, no carotid bruits  Lungs: No use of accessory muscles, bilateral insp crackles all fields  Cardiovascular: RRR, heart sounds normal, no murmur or gallops, no peripheral edema  Musculoskeletal: No deformities, no cyanosis, significant + clubbing  Neuro: alert, significant tremor, good strength.   Skin: Warm, no lesions or rashes  No problem-specific assessment & plan notes found for this encounter.

## 2012-12-26 NOTE — Patient Instructions (Addendum)
Augmentin 875mg  Twice daily  For 7 days. Mucinex Twice daily  For congestion . Delsym 2 tsp Twice daily  As needed  Cough.  Flutter valve Three times a day  As needed  Congestion  Follow up for CT chest next week as planned  Follow up w/ Dr. Delton Coombes  In 2 month and As needed   Please contact office for sooner follow up if symptoms do not improve or worsen or seek emergency care

## 2012-12-26 NOTE — Assessment & Plan Note (Signed)
Exacerbation w/ bronchitis   Plan  Augmentin 875mg  Twice daily  For 7 days. Mucinex Twice daily  For congestion . Delsym 2 tsp Twice daily  As needed  Cough.  Flutter valve Three times a day  As needed  Congestion  Follow up for CT chest next week as planned  Follow up w/ Dr. Delton Coombes  In 2 month and As needed   Please contact office for sooner follow up if symptoms do not improve or worsen or seek emergency care

## 2012-12-30 ENCOUNTER — Telehealth: Payer: Self-pay | Admitting: Emergency Medicine

## 2012-12-30 NOTE — Telephone Encounter (Signed)
Daughter aware.

## 2012-12-30 NOTE — Telephone Encounter (Signed)
Pt's daughter called to check on status.  Roy [name]

## 2012-12-30 NOTE — Telephone Encounter (Signed)
Spoke to pt's daughter. She states that after starting Augmentin he started having diarrhea. He took some Imodium and that did help but didn't resolve the problem. Pt is wanting his antibiotic changed.  TP - please advise. Thanks.

## 2012-12-30 NOTE — Telephone Encounter (Signed)
Stop abx after today , that will be 5 days of therapy  Cont w/ ov recs  Push fluids , begin probioitic -Align for 2 weeks  Please contact office for sooner follow up if symptoms do not improve or worsen or seek emergency care  follow up as planned and As needed

## 2012-12-31 ENCOUNTER — Ambulatory Visit (HOSPITAL_COMMUNITY)
Admission: RE | Admit: 2012-12-31 | Discharge: 2012-12-31 | Disposition: A | Discharge status: Home / Self Care | Payer: Medicare Other | Source: Ambulatory Visit | Attending: Radiation Oncology | Admitting: Radiation Oncology

## 2012-12-31 ENCOUNTER — Ambulatory Visit (HOSPITAL_COMMUNITY): Payer: Medicare Other

## 2012-12-31 DIAGNOSIS — I889 Nonspecific lymphadenitis, unspecified: Secondary | ICD-10-CM | POA: Insufficient documentation

## 2012-12-31 DIAGNOSIS — R059 Cough, unspecified: Secondary | ICD-10-CM | POA: Insufficient documentation

## 2012-12-31 DIAGNOSIS — J8410 Pulmonary fibrosis, unspecified: Secondary | ICD-10-CM | POA: Insufficient documentation

## 2012-12-31 DIAGNOSIS — M4120 Other idiopathic scoliosis, site unspecified: Secondary | ICD-10-CM | POA: Insufficient documentation

## 2012-12-31 DIAGNOSIS — C3490 Malignant neoplasm of unspecified part of unspecified bronchus or lung: Secondary | ICD-10-CM | POA: Insufficient documentation

## 2012-12-31 DIAGNOSIS — K769 Liver disease, unspecified: Secondary | ICD-10-CM | POA: Insufficient documentation

## 2012-12-31 DIAGNOSIS — Z923 Personal history of irradiation: Secondary | ICD-10-CM | POA: Insufficient documentation

## 2012-12-31 DIAGNOSIS — D7389 Other diseases of spleen: Secondary | ICD-10-CM | POA: Insufficient documentation

## 2012-12-31 DIAGNOSIS — IMO0002 Reserved for concepts with insufficient information to code with codable children: Secondary | ICD-10-CM | POA: Insufficient documentation

## 2012-12-31 DIAGNOSIS — R05 Cough: Secondary | ICD-10-CM | POA: Insufficient documentation

## 2012-12-31 DIAGNOSIS — R0602 Shortness of breath: Secondary | ICD-10-CM | POA: Insufficient documentation

## 2012-12-31 DIAGNOSIS — C61 Malignant neoplasm of prostate: Secondary | ICD-10-CM | POA: Insufficient documentation

## 2012-12-31 DIAGNOSIS — K8020 Calculus of gallbladder without cholecystitis without obstruction: Secondary | ICD-10-CM | POA: Insufficient documentation

## 2012-12-31 DIAGNOSIS — N281 Cyst of kidney, acquired: Secondary | ICD-10-CM | POA: Insufficient documentation

## 2013-01-02 ENCOUNTER — Encounter: Payer: Self-pay | Admitting: Radiation Oncology

## 2013-01-02 ENCOUNTER — Telehealth: Payer: Self-pay | Admitting: Emergency Medicine

## 2013-01-02 ENCOUNTER — Ambulatory Visit
Admission: RE | Admit: 2013-01-02 | Discharge: 2013-01-02 | Disposition: A | Discharge status: Home / Self Care | Payer: Medicare Other | Source: Ambulatory Visit | Attending: Radiation Oncology | Admitting: Radiation Oncology

## 2013-01-02 VITALS — BP 99/48 | HR 105 | Temp 98.9°F | Ht 66.0 in | Wt 132.7 lb

## 2013-01-02 DIAGNOSIS — C61 Malignant neoplasm of prostate: Secondary | ICD-10-CM

## 2013-01-02 DIAGNOSIS — C3490 Malignant neoplasm of unspecified part of unspecified bronchus or lung: Secondary | ICD-10-CM

## 2013-01-02 NOTE — Telephone Encounter (Signed)
Pt is requesting a written rx for liquid O2. Machines are expensive and he can buy on online cheaper but requires a written rx.  Will pick up in office.  Allergies  Allergen Reactions  . Iodine Nausea Only  . Sulfonamide Derivatives Nausea And Vomiting   Dr Delton Coombes please advise  Thank you

## 2013-01-02 NOTE — Progress Notes (Signed)
Radiation Oncology         [phone] ________________________________  Name: Roy [name] MRN: [id number]  Date: 01/02/2013  DOB: 1920/11/12  Follow-Up Visit Note  CC: Oliver Barre, MD  Crecencio Mc, MD  Diagnosis:   79 year old gentleman with a complex oncologic history including the following:  -----------------------------------------------------------------------------------------------------------  1. 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 2.4 cm tumor treated with curative, stereotactic body radiotherapy May 25, May 26, June 1, June 3, and February 01, 2009 to 50 Gy in five fractions of 10 Gy on TomoTherapy at   B. 4.6 cm recurrence treated with salvage re-irradiation of the chest for local control/palliation 05/08/2012-05/21/2012 to 30 Gy in 10 fractions on TomoTherapy   C. Now 7 cm  2. Prostate cancer with Gleason's 4+4 and a PSA of approximately 6 treated in [place] in 2005   A. Treated with Lupron, external beam radiation and high-dose-rate brachytherapy boost, complicated by a urethral stricture. His PSA nadir was 0.05 in 12/07 and current untreated biochemical recurrence and recent PSA of 21.85 on 09/13/12  -----------------------------------------------------------------------------------------------------------   Interval Since Last Radiation:  7  months  Narrative:  The patient returns today for routine follow-up.  He is now using 4 liters of O2 continuously.  He has moved in with his son.  He has some enlargement of his right lower lung mass potentially reflecting tumor re-growth and/or mass-like consolidation with some component of pneumonitis changes.                               ALLERGIES:  is allergic to iodine and sulfonamide derivatives.  Meds: Current Outpatient Prescriptions  Medication Sig Dispense Refill  . aspirin EC 81 MG tablet Take 81 mg by mouth every morning.       Marland Kitchen b complex vitamins capsule Take 1  capsule by mouth daily.      . fenofibrate (TRICOR) 145 MG tablet Take 1 tablet (145 mg total) by mouth daily.  90 tablet  3  . montelukast (SINGULAIR) 10 MG tablet TAKE 1 TABLET BY MOUTH AT BEDTIME  90 tablet  3  . pantoprazole (PROTONIX) 40 MG tablet Take 1 tablet (40 mg total) by mouth daily.  30 tablet  11  . primidone (MYSOLINE) 50 MG tablet Take 25 mg by mouth at bedtime. 1/2 in AM and 1/2 tablet in PM      . Probiotic Product (ALIGN) 4 MG CAPS Take 1 capsule by mouth daily.      Marland Kitchen tiotropium (SPIRIVA) 18 MCG inhalation capsule Place 18 mcg into inhaler and inhale daily.        . clonazePAM (KLONOPIN) 2 MG tablet Take 2 mg by mouth 2 (two) times daily. scheduled      . [DISCONTINUED] DICYCLOMINE HCL PO Take 1 tablet by mouth daily.        No current facility-administered medications for this encounter.    Physical Findings: The patient is in no acute distress. Patient is alert and oriented.  height is 5\' 6"  (1.676 m) and weight is 132 lb 11.2 oz (60.192 kg). His temperature is 98.9 F (37.2 C). His blood pressure is 99/48 and his pulse is 105. His oxygen saturation is 99%. .  No significant changes.  Lab Findings: Lab Results  Component Value Date   WBC 10.6* 09/06/2012   HGB 12.4* 09/06/2012   HCT 38.4* 09/06/2012  MCV 83.2 09/06/2012   PLT 164.0 09/06/2012    @LASTCHEM @  Radiographic Findings: Ct Chest Wo Contrast  12/31/2012  *RADIOLOGY REPORT*  Clinical Data: Lung cancer.  Restaging.  CT CHEST WITHOUT CONTRAST  Technique:  Multidetector CT imaging of the chest was performed following the standard protocol without IV contrast.  Comparison: 09/26/2012  Findings: No pleural effusion identified.  There is moderate to advanced changes of pulmonary fibrosis with basilar and peripheral predominant interstitial reticulation and honeycombing.  The right lower lobe lung mass measures 4.8 x 7.0 x 4.6 cm.  This is compared with 3.4 x 5.9 x 4.0 cm.  Calcified granuloma in the right upper lobe  is identified.  Heart size is normal.  No pericardial effusion.  Prevascular lymph node is stable measuring 10 mm, image 18/series 2.  Calcified right hilar and subcarinal lymph nodes are identified.  No progressive mediastinal or hilar adenopathy identified.  Multiple calcified gallstones are identified.  Calcified granulomas are seen within the liver and spleen.  The adrenal glands are both normal.  Bilateral renal cysts are present and remain incompletely characterized without IV contrast. Right-sided retrocrural lymph node measures 1.1 cm, image 48/series 2.  This is unchanged from previous exam. There is no enlarged axillary or supraclavicular adenopathy. Review of the visualized bony structures is significant for mild multilevel degenerative disc disease and scoliosis.  No aggressive lytic or sclerotic bone lesions identified.  IMPRESSION:  1.  Mass within the right lower lobe is increased in size from previous exam. Consider further evaluation with PET CT.  2.  Stable changes of pulmonary fibrosis.   Original Report Authenticated By: Signa Kell, M.D.    Impression:  The patient continues to suffer with multifactorial pulmonary dysfunction.  His treated right lower lung cancer site shows increasing soft tissue mass now measuring up to 7 cm. This potentially represents recurrent disease versus mass-like fibrosis.  If it is recurrence, there no clear salvage treatment options remaining for him in this setting.  Plan:  Repeat chest CT in 3 months then follow-up.  _____________________________________  Artist Pais. Kathrynn Running, M.D.

## 2013-01-02 NOTE — Progress Notes (Signed)
Roy [name] here for FU appointment Today.  Past history of radiation to his prostate in 2014 and radiation to his right lower lobe in 2013.  He denies any pain presently.  He is traveling by wheelchair with Oxygen at 4 liters/min via  which he using 24 hours daily.   He is hypotensive presently but has experienced diarrhea while taking Augmentin, so he has been encouraged to push water and gatorade and to decrease his caffeine intake.

## 2013-01-02 NOTE — Telephone Encounter (Signed)
Ok to order this 

## 2013-01-03 ENCOUNTER — Telehealth: Payer: Self-pay | Admitting: *Deleted

## 2013-01-03 NOTE — Telephone Encounter (Signed)
CALLED PATIENT TO INFORM OF TEST AND FU VISIT, LVM FOR A RETURN CALL 

## 2013-01-03 NOTE — Telephone Encounter (Signed)
Order done, son has come pick up \. Nothing further needed.

## 2013-01-03 NOTE — Telephone Encounter (Signed)
Msg done in wrong log in Rx completed and given to pt son by myself Nothing further

## 2013-01-05 ENCOUNTER — Encounter: Payer: Self-pay | Admitting: Radiation Oncology

## 2013-01-06 ENCOUNTER — Telehealth: Payer: Self-pay | Admitting: *Deleted

## 2013-01-06 NOTE — Telephone Encounter (Signed)
Called patient to inform of test and fu visit, lvm for a return call 

## 2013-01-22 ENCOUNTER — Telehealth: Payer: Self-pay

## 2013-01-22 NOTE — Telephone Encounter (Signed)
Pt's daughter aware and they will try the whole tab bid again.  Appt made for 6/26 at 1:00pm.

## 2013-01-22 NOTE — Telephone Encounter (Signed)
Pt's daughter calling and her dad has been a bit more shaky lately and they are wanting to know if and how they can increase the primidone.  He is currently taking 50mg  1/2 bid.

## 2013-01-22 NOTE — Telephone Encounter (Signed)
He is on such a low dose because it initially caused what he called "loopiness" when we first tried to start it.  We can try and increase to 1 bid but he does need a f/u appt in the next month or so

## 2013-01-24 ENCOUNTER — Encounter: Payer: Self-pay | Admitting: Radiation Oncology

## 2013-01-24 ENCOUNTER — Telehealth: Payer: Self-pay | Admitting: Radiation Oncology

## 2013-01-24 NOTE — Telephone Encounter (Signed)
Returned [employer]. [name] is the patient's son in Social worker. Per direction from Dr. Kathrynn Running explained that hospice would be a reasonable option at this time but, would prefer he consult with the pulmonologist also reference this matter. Roy [name] verbalized understanding and expressed appreciation for the call.

## 2013-01-25 ENCOUNTER — Encounter: Payer: Self-pay | Admitting: Emergency Medicine

## 2013-01-27 NOTE — Telephone Encounter (Signed)
Dr. Delton Coombes, pt family sent email requesting that hospice be ordered for the pt. They state that they have spoken with Dr. Kathrynn Running but he recommended for the final ok to come from you. Please advise if you want to order hospice for the pt and if you will be attending? Carron Curie, CMA

## 2013-02-03 ENCOUNTER — Telehealth: Payer: Self-pay | Admitting: Emergency Medicine

## 2013-02-03 NOTE — Telephone Encounter (Signed)
Need to call to discuss goals for care

## 2013-02-04 NOTE — Telephone Encounter (Signed)
Discussed pt's status with son Mr Cheree Ditto. He sees a slow progressive decline in his functional capacity and is wondering about Hospice. He believes that we need to start moving to Hospice. He is planning to speak to his dad about the options. I have asked him to set up an appointment with me to discuss depending on their conversation. I am willing to refer him for Hospice if he desires.

## 2013-02-08 ENCOUNTER — Other Ambulatory Visit: Payer: Self-pay | Admitting: Internal Medicine

## 2013-02-10 ENCOUNTER — Telehealth: Payer: Self-pay

## 2013-02-10 MED ORDER — PRIMIDONE 50 MG PO TABS: 25.0000 mg | ORAL_TABLET | Freq: Every day | ORAL | Status: DC

## 2013-02-10 NOTE — Telephone Encounter (Signed)
Faxed refill request from [facility] for refill of pt's primidone #30.  FYI pt scheduled for f/u on June 24th

## 2013-02-10 NOTE — Telephone Encounter (Signed)
rx sent in 

## 2013-02-10 NOTE — Telephone Encounter (Signed)
Okay to refill? 

## 2013-02-15 ENCOUNTER — Encounter (HOSPITAL_COMMUNITY): Payer: Self-pay | Admitting: Emergency Medicine

## 2013-02-15 ENCOUNTER — Emergency Department (HOSPITAL_COMMUNITY): Payer: Medicare Other

## 2013-02-15 ENCOUNTER — Emergency Department (HOSPITAL_COMMUNITY)
Admission: EM | Admit: 2013-02-15 | Discharge: 2013-02-15 | Disposition: A | Discharge status: Home / Self Care | Payer: Medicare Other | Attending: Emergency Medicine | Admitting: Emergency Medicine

## 2013-02-15 DIAGNOSIS — Z872 Personal history of diseases of the skin and subcutaneous tissue: Secondary | ICD-10-CM | POA: Insufficient documentation

## 2013-02-15 DIAGNOSIS — J441 Chronic obstructive pulmonary disease with (acute) exacerbation: Secondary | ICD-10-CM | POA: Insufficient documentation

## 2013-02-15 DIAGNOSIS — Z87448 Personal history of other diseases of urinary system: Secondary | ICD-10-CM | POA: Insufficient documentation

## 2013-02-15 DIAGNOSIS — K219 Gastro-esophageal reflux disease without esophagitis: Secondary | ICD-10-CM | POA: Insufficient documentation

## 2013-02-15 DIAGNOSIS — F3289 Other specified depressive episodes: Secondary | ICD-10-CM | POA: Insufficient documentation

## 2013-02-15 DIAGNOSIS — Z8719 Personal history of other diseases of the digestive system: Secondary | ICD-10-CM | POA: Insufficient documentation

## 2013-02-15 DIAGNOSIS — Z923 Personal history of irradiation: Secondary | ICD-10-CM | POA: Insufficient documentation

## 2013-02-15 DIAGNOSIS — C3490 Malignant neoplasm of unspecified part of unspecified bronchus or lung: Secondary | ICD-10-CM

## 2013-02-15 DIAGNOSIS — Z8639 Personal history of other endocrine, nutritional and metabolic disease: Secondary | ICD-10-CM | POA: Insufficient documentation

## 2013-02-15 DIAGNOSIS — Z8739 Personal history of other diseases of the musculoskeletal system and connective tissue: Secondary | ICD-10-CM | POA: Insufficient documentation

## 2013-02-15 DIAGNOSIS — S0990XA Unspecified injury of head, initial encounter: Secondary | ICD-10-CM | POA: Insufficient documentation

## 2013-02-15 DIAGNOSIS — D649 Anemia, unspecified: Secondary | ICD-10-CM | POA: Insufficient documentation

## 2013-02-15 DIAGNOSIS — S00209A Unspecified superficial injury of unspecified eyelid and periocular area, initial encounter: Secondary | ICD-10-CM | POA: Insufficient documentation

## 2013-02-15 DIAGNOSIS — E119 Type 2 diabetes mellitus without complications: Secondary | ICD-10-CM | POA: Insufficient documentation

## 2013-02-15 DIAGNOSIS — S2249XA Multiple fractures of ribs, unspecified side, initial encounter for closed fracture: Secondary | ICD-10-CM | POA: Insufficient documentation

## 2013-02-15 DIAGNOSIS — Z7982 Long term (current) use of aspirin: Secondary | ICD-10-CM | POA: Insufficient documentation

## 2013-02-15 DIAGNOSIS — Z8679 Personal history of other diseases of the circulatory system: Secondary | ICD-10-CM | POA: Insufficient documentation

## 2013-02-15 DIAGNOSIS — Z8709 Personal history of other diseases of the respiratory system: Secondary | ICD-10-CM | POA: Insufficient documentation

## 2013-02-15 DIAGNOSIS — IMO0002 Reserved for concepts with insufficient information to code with codable children: Secondary | ICD-10-CM | POA: Insufficient documentation

## 2013-02-15 DIAGNOSIS — Z87442 Personal history of urinary calculi: Secondary | ICD-10-CM | POA: Insufficient documentation

## 2013-02-15 DIAGNOSIS — F329 Major depressive disorder, single episode, unspecified: Secondary | ICD-10-CM | POA: Insufficient documentation

## 2013-02-15 DIAGNOSIS — Z87891 Personal history of nicotine dependence: Secondary | ICD-10-CM | POA: Insufficient documentation

## 2013-02-15 DIAGNOSIS — G8929 Other chronic pain: Secondary | ICD-10-CM | POA: Insufficient documentation

## 2013-02-15 DIAGNOSIS — Z8546 Personal history of malignant neoplasm of prostate: Secondary | ICD-10-CM | POA: Insufficient documentation

## 2013-02-15 DIAGNOSIS — S2232XA Fracture of one rib, left side, initial encounter for closed fracture: Secondary | ICD-10-CM

## 2013-02-15 DIAGNOSIS — F411 Generalized anxiety disorder: Secondary | ICD-10-CM | POA: Insufficient documentation

## 2013-02-15 DIAGNOSIS — Z79899 Other long term (current) drug therapy: Secondary | ICD-10-CM | POA: Insufficient documentation

## 2013-02-15 DIAGNOSIS — Y9389 Activity, other specified: Secondary | ICD-10-CM | POA: Insufficient documentation

## 2013-02-15 DIAGNOSIS — Z8669 Personal history of other diseases of the nervous system and sense organs: Secondary | ICD-10-CM | POA: Insufficient documentation

## 2013-02-15 DIAGNOSIS — Z862 Personal history of diseases of the blood and blood-forming organs and certain disorders involving the immune mechanism: Secondary | ICD-10-CM | POA: Insufficient documentation

## 2013-02-15 DIAGNOSIS — W010XXA Fall on same level from slipping, tripping and stumbling without subsequent striking against object, initial encounter: Secondary | ICD-10-CM | POA: Insufficient documentation

## 2013-02-15 DIAGNOSIS — M25519 Pain in unspecified shoulder: Secondary | ICD-10-CM | POA: Insufficient documentation

## 2013-02-15 DIAGNOSIS — C7800 Secondary malignant neoplasm of unspecified lung: Secondary | ICD-10-CM | POA: Insufficient documentation

## 2013-02-15 DIAGNOSIS — Y92009 Unspecified place in unspecified non-institutional (private) residence as the place of occurrence of the external cause: Secondary | ICD-10-CM | POA: Insufficient documentation

## 2013-02-15 LAB — POCT I-STAT, CHEM 8
Creatinine, Ser: 1.1 mg/dL (ref 0.50–1.35)
HCT: 31 % — ABNORMAL LOW (ref 39.0–52.0)
Hemoglobin: 10.5 g/dL — ABNORMAL LOW (ref 13.0–17.0)
Potassium: 3.7 mEq/L (ref 3.5–5.1)
Sodium: 141 mEq/L (ref 135–145)

## 2013-02-15 LAB — CBC WITH DIFFERENTIAL/PLATELET
Basophils Absolute: 0 10*3/uL (ref 0.0–0.1)
Eosinophils Absolute: 0.1 10*3/uL (ref 0.0–0.7)
Lymphocytes Relative: 7 % — ABNORMAL LOW (ref 12–46)
Lymphs Abs: 0.4 10*3/uL — ABNORMAL LOW (ref 0.7–4.0)
MCH: 24.1 pg — ABNORMAL LOW (ref 26.0–34.0)
Neutrophils Relative %: 84 % — ABNORMAL HIGH (ref 43–77)
Platelets: 146 10*3/uL — ABNORMAL LOW (ref 150–400)
RBC: 3.74 MIL/uL — ABNORMAL LOW (ref 4.22–5.81)
WBC: 4.9 10*3/uL (ref 4.0–10.5)

## 2013-02-15 MED ORDER — TRAMADOL HCL 50 MG PO TABS: 50.0000 mg | ORAL_TABLET | Freq: Four times a day (QID) | ORAL | Status: DC | PRN

## 2013-02-15 MED ORDER — ACETAMINOPHEN 325 MG PO TABS
650.0000 mg | ORAL_TABLET | Freq: Once | ORAL | Status: AC
  Administered 2013-02-15: 650 mg via ORAL
  Filled 2013-02-15: qty 2

## 2013-02-15 MED ORDER — HYDROCODONE-ACETAMINOPHEN 5-325 MG PO TABS: 1.0000 | ORAL_TABLET | Freq: Four times a day (QID) | ORAL | Status: DC | PRN

## 2013-02-15 NOTE — ED Provider Notes (Signed)
History     CSN: [id number]  Arrival date & time 02/15/13  1258   First MD Initiated Contact with Patient 02/15/13 1308      Chief Complaint  Patient presents with  . Fall    (Consider location/radiation/quality/duration/timing/severity/associated sxs/prior treatment) Patient is a 79 y.o. male presenting with fall. The history is provided by the patient.  Fall Associated symptoms include chest pain. Pertinent negatives include no abdominal pain, no headaches and no shortness of breath.   patient had a trip and fall 2 days ago. He has left chest and back pain since the fall. Worse with breathing. Worse with palpation. He began to cough up a little blood today. He is known lung cancer on the right side and there is talk of hospice for him. No other bleeding. No dysuria. No hematuria. No fevers. He's not on anticoagulation.  Past Medical History  Diagnosis Date  . Anxiety   . Asthma   . Depression   . Kidney stones     hx of   . Pulmonary fibrosis   . Hx of radiation therapy may 2010 thru june 2010    5 tx, stereotactic radiotherapy  . Tremor, essential   . COPD (chronic obstructive pulmonary disease)   . DM (diabetes mellitus)   . Normocytic anemia   . Diverticulosis   . Allergic rhinitis   . Hyperlipidemia   . Adenomatous colon polyp   . Chronic left shoulder pain   . Chronic constipation   . Hemorrhoids   . Cellulitis of leg, right   . Edema   . Radiation May/June 2010    stereotactic body radiotherapy  . Urethral stricture 04/18/2012    Dr mcdermot/urology  . GERD (gastroesophageal reflux disease)   . Hx of radiation therapy 05/08/12 -05/21/12    RLL lung  . Zenker's diverticulum   . Prostate cancer 2005    gleason 4+4=8  . Lung cancer 2010, 2013    RLL, 2013 recurrent     Past Surgical History  Procedure Laterality Date  . Appendectomy    . Hdr brachytherapy implant for prostate cancer  2005  . Cataract extraction, bilateral    . Internal urethrotomy   12/09/2008  . Colonoscopy  October 2008    Diverticulosis, hemorrhoids  . Tonsillectomy  1943  . Ct biopsy  04/11/2012    invasive squamous cell carcinoma    Family History  Problem Relation Age of Onset  . Prostate cancer Brother   . Colon cancer Sister   . Bladder Cancer Father   . Lung cancer Brother   . Heart disease Brother     History  Substance Use Topics  . Smoking status: Former Smoker -- 1.00 packs/day for 35 years    Quit date: 08/14/1989  . Smokeless tobacco: Never Used  . Alcohol Use: No     Comment: none in 10 years      Review of Systems  Constitutional: Negative for activity change and appetite change.  HENT: Negative for neck stiffness.   Eyes: Negative for pain.  Respiratory: Negative for chest tightness and shortness of breath.   Cardiovascular: Positive for chest pain. Negative for leg swelling.  Gastrointestinal: Negative for nausea, vomiting, abdominal pain and diarrhea.  Genitourinary: Negative for flank pain.  Musculoskeletal: Negative for back pain.  Skin: Negative for rash.  Neurological: Negative for weakness, numbness and headaches.  Psychiatric/Behavioral: Negative for behavioral problems.    Allergies  Iodine and Sulfonamide derivatives  Home Medications  Current Outpatient Rx  Name  Route  Sig  Dispense  Refill  . aspirin EC 81 MG tablet   Oral   Take 81 mg by mouth every morning.          Marland Kitchen b complex vitamins capsule   Oral   Take 1 capsule by mouth daily.         . clonazePAM (KLONOPIN) 2 MG tablet   Oral   Take 2 mg by mouth 2 (two) times daily. scheduled         . fenofibrate (TRICOR) 145 MG tablet   Oral   Take 1 tablet (145 mg total) by mouth daily.   90 tablet   3   . montelukast (SINGULAIR) 10 MG tablet      TAKE 1 TABLET BY MOUTH AT BEDTIME   90 tablet   3   . pantoprazole (PROTONIX) 40 MG tablet   Oral   Take 1 tablet (40 mg total) by mouth daily.   30 tablet   11   . primidone (MYSOLINE) 50  MG tablet   Oral   Take 25 mg by mouth 2 (two) times daily.         . Probiotic Product (ALIGN) 4 MG CAPS   Oral   Take 1 capsule by mouth daily.         Marland Kitchen tiotropium (SPIRIVA) 18 MCG inhalation capsule   Inhalation   Place 18 mcg into inhaler and inhale daily.           Marland Kitchen HYDROcodone-acetaminophen (NORCO/VICODIN) 5-325 MG per tablet   Oral   Take 1 tablet by mouth every 6 (six) hours as needed for pain.   10 tablet   0   . traMADol (ULTRAM) 50 MG tablet   Oral   Take 1 tablet (50 mg total) by mouth every 6 (six) hours as needed for pain.   15 tablet   0     BP 106/55  Temp(Src) 97.7 F (36.5 C) (Oral)  Resp 20  SpO2 93%  Physical Exam  Nursing note and vitals reviewed. Constitutional: He is oriented to person, place, and time. He appears well-developed and well-nourished.  HENT:  Head: Normocephalic.  Abrasion to left orbital ridge. No bony tenderness  Eyes: EOM are normal. Pupils are equal, round, and reactive to light.  Neck: Normal range of motion. Neck supple.  Cardiovascular: Normal rate, regular rhythm and normal heart sounds.   No murmur heard. Pulmonary/Chest: Effort normal and breath sounds normal. He exhibits tenderness.  Tenderness to left lateral lower chest wall. No abdominal tenderness. No crepitance deformity. No subcutaneous emphysema.  Abdominal: Soft. Bowel sounds are normal. He exhibits no distension and no mass. There is no tenderness. There is no rebound and no guarding.  Musculoskeletal: Normal range of motion. He exhibits no edema.  Neurological: He is alert and oriented to person, place, and time. No cranial nerve deficit.  Skin: Skin is warm and dry.  Psychiatric: He has a normal mood and affect.    ED Course  Procedures (including critical care time)  Labs Reviewed  CBC WITH DIFFERENTIAL - Abnormal; Notable for the following:    RBC 3.74 (*)    Hemoglobin 9.0 (*)    HCT 30.5 (*)    MCH 24.1 (*)    MCHC 29.5 (*)    RDW 17.6  (*)    Platelets 146 (*)    Neutrophils Relative % 84 (*)    Lymphocytes Relative 7 (*)  Lymphs Abs 0.4 (*)    All other components within normal limits  POCT I-STAT, CHEM 8 - Abnormal; Notable for the following:    Glucose, Bld 115 (*)    Hemoglobin 10.5 (*)    HCT 31.0 (*)    All other components within normal limits   Dg Ribs Unilateral W/chest Left  02/15/2013   *RADIOLOGY REPORT*  Clinical Data: Fall with left-sided rib pain.  History of right lung squamous carcinoma with recurrence.  LEFT RIBS AND CHEST - 3+ VIEW  Comparison: CT of the chest on 12/31/2012.  Findings: Prior chest radiograph again demonstrates severe fibrotic lung disease and a large right-sided pulmonary mass which has been previously depicted by CT.  No pneumothorax or significant pleural effusion is identified.  One of the left rib films demonstrates possible subtle nondisplaced fractures of the distal eighth and ninth ribs.  No bony lesions are identified.  IMPRESSION:  1.  Possible subtle nondisplaced fractures of the left eighth and ninth ribs.  No associated pneumothorax or hemothorax. 2.  Pulmonary fibrosis and large right-sided pulmonary mass.   Original Report Authenticated By: Irish Lack, M.D.     1. Rib fractures, left, closed, initial encounter   2. Anemia   3. NEOPLASM, MALIGNANT, LUNG, NONSMALL CELL       MDM  Patient was a fall 2 days ago he hit his head I doubt intracranial hemorrhage. He has no headache and has been 2 days since he fall. He does have tenderness in his left chest. He has 2 visible fractures on x-ray. No pneumothorax. Doubt splenic injury. He does have a mild anemia. He has had a little, this is what she did come from the rib fractures and mild contusion at that site or from lung cancer. He is not tachycardic and not hypotensive. He is moving towards hospice care I think he does not need extensive workup for hemoptysis right now. He will followup with his primary care Dr. Clista Bernhardt be  given some pain medicines. He'll also be given the symptoms from her        Juliet Rude. Rubin Payor, MD 02/15/13 1536

## 2013-02-15 NOTE — ED Notes (Signed)
[id number]<JY> Expected date:02/15/13<BR> Expected time:12:43 PM<BR> Means of arrival:<BR> Comments:<BR> Fall/rib pain/ coughing blood

## 2013-02-15 NOTE — ED Notes (Addendum)
Per EMS: fell at home on Thurs from bath tub, stuck b/w tub and toilet. C.o of pain left rib with cough and sneezing. Pain 8/10. Small abrasion to left eye from fall. Coughed up blood this morning once, bright red in color.

## 2013-02-18 ENCOUNTER — Ambulatory Visit: Payer: Medicare Other | Admitting: Neurology

## 2013-02-21 ENCOUNTER — Telehealth: Payer: Self-pay | Admitting: Neurology

## 2013-02-21 NOTE — Telephone Encounter (Signed)
Received vm message at 4:24 pm: Patient's son, Roy [name] called to say that his dad has had 2 pretty good falls in the past 2 weeks. His new PCP is Marletta Lor and she is wondering if his Klonopin may be causing some of his problems in regards to falls. She is wondering if it can be reduced to one mg BID vs his current dose of two mg BID. He wants to run that by Dr. Arbutus Leas first and see what she thinks. This patient has a f/u dcheduled with Dr. Arbutus Leas on 03/05/13. **Dr. Arbutus Leas, please advise. Thanks.

## 2013-02-23 NOTE — Telephone Encounter (Signed)
That sounds fine to me.  I didn't put him on that klonopin, but I didn't change it last visit either.  He has an appt on 7/9 and we will talk further then.

## 2013-02-24 NOTE — Telephone Encounter (Signed)
Called and spoke with the patient's son, Roy [name]. Information given as per Dr. Arbutus Leas below. Will reduce the dose of Klonopin and see if it is beneficial. Will f/u with Dr. Arbutus Leas on 07/09. Asked that he call if additional questions. He states he will.

## 2013-02-26 ENCOUNTER — Encounter: Payer: Self-pay | Admitting: Emergency Medicine

## 2013-02-26 ENCOUNTER — Ambulatory Visit (INDEPENDENT_AMBULATORY_CARE_PROVIDER_SITE_OTHER): Payer: Medicare Other | Admitting: Emergency Medicine

## 2013-02-26 VITALS — BP 100/60 | HR 51 | Temp 98.5°F | Ht 64.0 in | Wt 131.2 lb

## 2013-02-26 DIAGNOSIS — J4489 Other specified chronic obstructive pulmonary disease: Secondary | ICD-10-CM

## 2013-02-26 DIAGNOSIS — C3490 Malignant neoplasm of unspecified part of unspecified bronchus or lung: Secondary | ICD-10-CM

## 2013-02-26 DIAGNOSIS — J449 Chronic obstructive pulmonary disease, unspecified: Secondary | ICD-10-CM

## 2013-02-26 MED ORDER — IPRATROPIUM-ALBUTEROL 0.5-2.5 (3) MG/3ML IN SOLN: 3.0000 mL | Freq: Four times a day (QID) | RESPIRATORY_TRACT | Status: AC

## 2013-02-26 NOTE — Patient Instructions (Addendum)
Stop Spiriva Start albuterol + ipratropium nebulizers 4 times a day Continue to use albuterol as needed Get your CT scan as planned by Dr Kathrynn Running Dr Delton Coombes will refer you to get hospice support at home.  Follow with Dr Delton Coombes in 3 months or sooner if you have any problems.

## 2013-02-26 NOTE — Progress Notes (Signed)
History of Present Illness:  79 yo former smoker with hx COPD, prostatic CA s/p resection 2005, ureteral strictures. Evaluated in ED 3/10 for abd pain, suspected from full bladder, some tremor and dysarthria. Underwent MRI abdomen that revealed RLL mass. Subsequent PET/CT also shows subpleural ILD. Has albuterol, rarely requires. uses Spiriva daily.   12/31/08 -- Pt returns to [facility] today s/p needle bx of his RUL mass. The bx shows non-small cell lung CA, with features suggestive of adenoCA. Has completed swallowing eval for his ILD w/u.   ROV 10/27/09 -- Returns today to tell me that he is having more trouble swalowing. Food sems to go down OK, but he has consistent coughing with thin liquids. His speech Rx eval in May 10 recommended thin liquids Completed radiation in June 10. Followed by Dr Kathrynn Running with serial scans, next one to be in May 11. Wt stable. Some stable exertional SOB. No exacerbations of COPD in last year.   ROV 11/30/09 -- follows up to discuss swallow eval results. Tells me today he has been more fatigued, much more tired since last visit. His diet has "gone to hell" since his swallowing eval showed that he needs nectar thick liquids, has silent aspiration of large bolused solids.   ROV 02/16/10 -- returns for f/u of his ILD from aspiration, COPD, adenoCA s/p XRT. Since last visit has had CT scan in 5/11 that showed some scarring in the region of his lung mass. Last time we changed back to his usual diet because the modified diet was intolerable. His wt is up some 5 lbs. Still trying to use thickener in his drinks.   ROV 06/29/10 -- hx chronic aspiration and ILD, adenoCA s/p XRT. Since last visit he has several falls, still lives alone. Tells me that he is feeling OK, but the thickened diet is making him constipated. Miralax was started about 4 days ago. Was just in [place] for rehab after discharged for a fall. No aspiration symptoms, no cough with taking by mouth as long as he thinckens. Spiriva  was substituted with atrovent briefly, now back on Spiriva - missed it.   ROV 05/28/12 -- hx chronic aspiration and ILD, adenoCA s/p XRT. Returns for eval progressive dyspnea. 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 Comparison of PET 7/22 to  CT scan from 01/2011  And 07/2011 show stable ILD changes. He tells me that minimal exertion is now limiting him - SOB after 50 - 60 feet walking. 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 Was confused in late October, possibly due to the XRT. Suffered a fall 11/7 >> CT scan head and C-spine OK. Has been having trouble getting his O2 clarified - was initially on eclipse, then to concentrator + liquid o2 and pulsed O2. Currently set on 3L/min pulsed. Feels his MS is back to normal.   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 Hx of Zenker's Diverticulum.  Complains of rattling cough x 4 weeks. Feels breathing has gotten worse. Has had lots of congestion since [place]. Reports difficulty with sleeping because he can't breathe due to thick congestion and rattle  in his chest.  Admitted 08/26/12 -08/29/12 for Acute on chronic resp failure and SOB: secondary to interstitial fibrosis, mild exacerbation of COPD and radiation pneumonitis. Mild bronchitic to bronchiectasis , tx w/ abx, steroids CE'z neg X 3, V/Q scan w/o PE.Fredna Dow he got some better but cough has not resolved.  Had CT chest 09/26/12 >5.2 cm mass-like opacity in the posterior medial right lower lobe,  mildly increased Has follow up with Dr. Kathrynn Running next week to discuss.  He denies any hemoptysis, orthopnea, PND, increased  leg swelling, fever, or chest pain.  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 His PSA is rising, underwent bone scan > no mets seen. CT scan abd > stable LAD.  Minimal cough. No overt aspiration. His O2 is on 3L/min rest. Still on Spiriva.   Acute OV 12/26/12 --  Complains of prod cough with green mucus x1 weeks, denies increased SOB, wheezing, tightness. using mucinex, delsym, flutter.  Over last week -cough and congestion have worsened.  He is accompanied by his daughter today  Whom he lives with.  Difficult to get up mucus as it is thick despite using flutter valve.  No fever, chest pain , edema, orthopnea, abd pain or n/v.  Appetite is good. No weight loss.  He has upcoming CT chest next week w/ follow up with Radiation oncology specialist.   ROV 02/26/13 -- chronic aspiration, ILD from this + XRT for squamous cell CA. Also w hx prostatic CA, tremor. He is now living with his son. His RLL mass is enlarging, last Ct scan 01/01/13. His son contacted me recently about possible hospice care. He is having more dyspnea, trouble taking the spiriva. He slipped and fell, had hairline fx of 2 ribs.    OBJ:  Filed Vitals:   02/26/13 1400  BP: 100/60  Pulse: 51  Temp: 98.5 F (36.9 C)   Gen: Pleasant, frail /elderly  in no distress,  normal affect  ENT: No lesions,  mouth clear,  oropharynx clear, no postnasal drip  Neck: No JVD, no TMG, no carotid bruits  Lungs: No use of accessory muscles, bilateral insp crackles all fields  Cardiovascular: RRR, heart sounds normal, no murmur or gallops, no peripheral edema  Musculoskeletal: No deformities, no cyanosis, significant + clubbing  Neuro: alert, significant tremor, globally weak, using walker for balance.   Skin: Warm, dressed lesion L arm.   NEOPLASM, MALIGNANT, LUNG, NONSMALL CELL His functional capacity has  declined over last few months. His RLL lesion, presumed mass, is enlarging. There is no salvage therapy available. I agree that we should concentrate on symptom management and quality of life at this point.  - agree with starting hospice support - will change spiriva to scheduled nebs - repeat CT scan for prognostic reasons per Dr Broadus John schedule.   COPD - change spiriva to duonebs qid

## 2013-02-26 NOTE — Assessment & Plan Note (Addendum)
His functional capacity has declined over last few months. His RLL lesion, presumed mass, is enlarging. There is no salvage therapy available. I agree that we should concentrate on symptom management and quality of life at this point.  - agree with starting hospice support - will change spiriva to scheduled nebs - repeat CT scan for prognostic reasons per Dr Broadus John schedule.

## 2013-02-26 NOTE — Assessment & Plan Note (Signed)
-   change spiriva to duonebs qid

## 2013-02-27 ENCOUNTER — Telehealth: Payer: Self-pay | Admitting: Emergency Medicine

## 2013-02-27 NOTE — Telephone Encounter (Signed)
Yes this absolutely OK. If we need to do a yellow form for him we can and they can pick it up.

## 2013-02-27 NOTE — Telephone Encounter (Signed)
Please advise Dr. Byrum thanks 

## 2013-02-27 NOTE — Telephone Encounter (Signed)
Spoke with Marisue Humble at [facility] Per Rb DNR is fine  verbal order given and nothing further needed at this time

## 2013-03-05 ENCOUNTER — Encounter: Payer: Self-pay | Admitting: Neurology

## 2013-03-05 ENCOUNTER — Ambulatory Visit (INDEPENDENT_AMBULATORY_CARE_PROVIDER_SITE_OTHER): Payer: Medicare Other | Admitting: Neurology

## 2013-03-05 VITALS — BP 108/64 | HR 100 | Temp 98.1°F | Resp 20 | Wt 132.0 lb

## 2013-03-05 DIAGNOSIS — W19XXXA Unspecified fall, initial encounter: Secondary | ICD-10-CM

## 2013-03-05 DIAGNOSIS — G250 Essential tremor: Secondary | ICD-10-CM

## 2013-03-05 MED ORDER — PRIMIDONE 50 MG PO TABS: 50.0000 mg | ORAL_TABLET | Freq: Two times a day (BID) | ORAL | Status: AC

## 2013-03-05 MED ORDER — CLONAZEPAM 1 MG PO TABS: 1.0000 mg | ORAL_TABLET | Freq: Two times a day (BID) | ORAL | Status: AC

## 2013-03-05 NOTE — Patient Instructions (Addendum)
Follow up in six months or sooner if you need [place].

## 2013-03-05 NOTE — Progress Notes (Signed)
Roy [name] was seen today in the movement disorders clinic for f/u.  This patient is accompanied in the office by his child who supplements the history.   The first symptom(s) the patient noticed was tremor that started when he was 79 y/o.  He noted tremor in both hands first, but about 1 year later, it involved the voice.  Not long thereafter the head was also involved. It has gotten worse with time, primarily the voice.    He states that he is frustrated b/c people cannot understand him on the phone.   He reports that balance was okay until 2011.  He sustained a fall in 2011 and balance has been poor since.  PT helps, and he has been under 3 courses of PT.  He has not gone to PT in over a year, but goes to the gym 3 times per week.   He was recently told not to go to the gym b/c of poor lung function. Marland Kitchen  He uses OT devices in the shower (seat) to help him.  He cannot button clothing.    He cannot write because of tremor and this is one of the bothersome symptoms.  He is on klonopin now and has been on it for about 5 years and it helped initially but he is not sure it is helping now.  He takes 2mg  bid.  He has been on propranolol in the past but it did not help.  He has trouble getting in his hearing aids and doing things that require fine motor coordination.  In the past, alcohol made him "steady as a rock."  He has not noticed an effect with caffeine.  There is a strong family history of tremor on his mothers side (mother, uncle, aunt, cousin).  He has never had botox injections for the voice.  He has some urinary incontinence since a procedure for prostate CA.  When he had brachytherapy, one of the radiation seeds went through the urethra and scarred it and he had problems after that.    03/05/2013  The patient does have a history of both lung and prostate cancer.  His PSA had been increasing and he had some suspicious adenopathy, so he had a bone scan on 10/04/2012 that was unremarkable.  The  patient has wanted to be considered for DBS, but I do not think that he is a candidate because of his multiple other medical problems.  The patient did see Dr. Kathrynn Running on May 8 and the treated right lower lobe cancer site showed increased soft tissue mass.  He is now in hospice.  The patient is on primidone for tremor.  He was at half tablet twice a day and he called on 01/22/2013 and this was increased to one tablet twice a day. They never did that.   He was on klonopin 2mg , 1 tablets bid and I got a call to see if that could be decreased because of falls/near falls.   I was not the one who placed him originally on that medication, but I agreed with decreasing the dosage.   He didn't end up doing that primarily because he daughter realized that the pt tried to pour the klonopin and primidone in the same bottle and ended up taking them incorrectly.  Once they straighted out the problem, tremor improved.  He has not fallen since then, but did fall 3 weeks ago.  He slipped off the toilet and broke 2 ribs.  Otherwise he has had  near falls.  He has had some day/night reversal.      PREVIOUS MEDICATIONS:  Propranolol  ALLERGIES:   Allergies  Allergen Reactions  . Iodine Nausea Only  . Sulfonamide Derivatives Nausea And Vomiting    CURRENT MEDICATIONS:  Current Outpatient Prescriptions on File Prior to Visit  Medication Sig Dispense Refill  . aspirin EC 81 MG tablet Take 81 mg by mouth every morning.       Marland Kitchen b complex vitamins capsule Take 1 capsule by mouth daily.      . clonazePAM (KLONOPIN) 2 MG tablet Take 2 mg by mouth 2 (two) times daily. scheduled      . fenofibrate (TRICOR) 145 MG tablet Take 1 tablet (145 mg total) by mouth daily.  90 tablet  3  . HYDROcodone-acetaminophen (NORCO/VICODIN) 5-325 MG per tablet Take 1 tablet by mouth every 6 (six) hours as needed for pain.  10 tablet  0  . ipratropium-albuterol (DUONEB) 0.5-2.5 (3) MG/3ML SOLN Take 3 mLs by nebulization 4 (four) times daily.   360 mL  11  . montelukast (SINGULAIR) 10 MG tablet TAKE 1 TABLET BY MOUTH AT BEDTIME  90 tablet  3  . pantoprazole (PROTONIX) 40 MG tablet Take 1 tablet (40 mg total) by mouth daily.  30 tablet  11  . primidone (MYSOLINE) 50 MG tablet Take 25 mg by mouth 2 (two) times daily.      . Probiotic Product (ALIGN) 4 MG CAPS Take 1 capsule by mouth daily.      Marland Kitchen tiotropium (SPIRIVA) 18 MCG inhalation capsule Place 18 mcg into inhaler and inhale daily.        . traMADol (ULTRAM) 50 MG tablet Take 1 tablet (50 mg total) by mouth every 6 (six) hours as needed for pain.  15 tablet  0  . [DISCONTINUED] DICYCLOMINE HCL PO Take 1 tablet by mouth daily.        No current facility-administered medications on file prior to visit.    PAST MEDICAL HISTORY:   Past Medical History  Diagnosis Date  . Anxiety   . Asthma   . Depression   . Kidney stones     hx of   . Pulmonary fibrosis   . Hx of radiation therapy may 2010 thru june 2010    5 tx, stereotactic radiotherapy  . Tremor, essential   . COPD (chronic obstructive pulmonary disease)   . DM (diabetes mellitus)   . Normocytic anemia   . Diverticulosis   . Allergic rhinitis   . Hyperlipidemia   . Adenomatous colon polyp   . Chronic left shoulder pain   . Chronic constipation   . Hemorrhoids   . Cellulitis of leg, right   . Edema   . Radiation May/June 2010    stereotactic body radiotherapy  . Urethral stricture 04/18/2012    Dr mcdermot/urology  . GERD (gastroesophageal reflux disease)   . Hx of radiation therapy 05/08/12 -05/21/12    RLL lung  . Zenker's diverticulum   . Prostate cancer 2005    gleason 4+4=8  . Lung cancer 2010, 2013    RLL, 2013 recurrent     PAST SURGICAL HISTORY:   Past Surgical History  Procedure Laterality Date  . Appendectomy    . Hdr brachytherapy implant for prostate cancer  2005  . Cataract extraction, bilateral    . Internal urethrotomy  12/09/2008  . Colonoscopy  October 2008    Diverticulosis,  hemorrhoids  . Tonsillectomy  1943  .  Ct biopsy  04/11/2012    invasive squamous cell carcinoma    SOCIAL HISTORY:   History   Social History  . Marital Status: Widowed    Spouse Name: N/A    Number of Children: 1  . Years of Education: N/A   Occupational History  . CPA     Retired   . retired     Company secretary   Social History Main Topics  . Smoking status: Former Smoker -- 1.00 packs/day for 35 years    Quit date: 08/14/1989  . Smokeless tobacco: Never Used  . Alcohol Use: No     Comment: none in 10 years  . Drug Use: No  . Sexually Active: Not on file   Other Topics Concern  . Not on file   Social History Narrative   Daily caffeine     FAMILY HISTORY:   Family Status  Relation Status Death Age  . Mother Deceased     MVA  . Father Deceased     bladder CA  . Brother Deceased     lung CA  . Brother Deceased     CA  . Sister Deceased     CA  . Child Alive     daughter, anorexia    ROS:  A complete 10 system review of systems was obtained and was unremarkable apart from what is mentioned above.  PHYSICAL EXAMINATION:    VITALS:   Filed Vitals:   03/05/13 1423  BP: 108/64  Pulse: 100  Temp: 98.1 F (36.7 C)  Resp: 20  Weight: 132 lb (59.875 kg)    GEN:  The patient appears stated age and is in NAD.   Neurological examination:  Orientation: The patient is alert and oriented x3. Fund of knowledge is appropriate.  Recent and remote memory are intact.  Attention and concentration are normal.    Able to name objects and repeat phrases. Cranial nerves: There is good facial symmetry. Pupils are equal round and reactive to light bilaterally. Fundoscopic exam reveals clear margins bilaterally. Extraocular muscles are intact. The visual fields are full to confrontational testing. The patient has tremor of the voice and significant spasmodic dysphonia. Soft palate rises symmetrically and there is no tongue deviation. Hearing is decreased to conversational  tone. Sensation: Sensation is intact to light throughout (facial, trunk, extremities). There is no extinction with double simultaneous stimulation. There is no sensory dermatomal level identified. Motor: Strength is 5/5 in the bilateral upper and lower extremities.  However, he cannot abduct the left shoulder greater than 90 degrees b/c of prior dislocation of the shoulder. Shoulder shrug is equal and symmetric.  There is no pronator drift. Deep tendon reflexes: Deep tendon reflexes are 0/4 at the bilateral biceps, triceps, brachioradialis, patella and achilles. Plantar responses are downgoing bilaterally.  Movement examination: Tone: There is normal tone in the bilateral upper extremities.  The tone in the lower extremities is normal.  Abnormal movements: There is tremor in the head in the "yes" direction.  There is tremor of the hands/arms but more so if the patients arms are held antigravity and proximal. It is overall mild today.   No asterixis.  He has trouble with archimedes spirals bilaterally. Coordination:  There is no decremation with RAM's. Gait and Station: The patient has  difficulty arising out of a deep-seated chair without the use of the hands. The patient's stride length is normal but there is postural instability..      ASSESSMENT:  1.  Essential Tremor.  This is evidenced by the longstanding nature, strong fam hx, and improvement with alcohol.  I see no evidence of PD in this patient.  There is no rigidity or bradykinesia.  2. Postural instability.  About 30% of pts with significant ET will have balance difficulties as well.  He may have some PN as well from the lung CA.  I see no evidence of LEMS.  Medication may be contributing as well.   PLAN:  1.  I talked to the patient about the fact that spasmodic dysphonia is very unresponsive to to medications.  The only effective treatment is Botox to the vocal cords.  There is a risk of dysphagia with this.  He does not wish to be  referred to ENT for consideration for this right now and his daughter agrees as he has a hx of dysphagia with a zenkers diverticulum.   2.  I will decrease the klonopin (cut in half) to 1mg  bid.  Risks, benefits, side effects and alternative therapies were discussed.  The opportunity to ask questions was given and they were answered to the best of my ability.  The patient expressed understanding and willingness to follow the outlined treatment protocols. 3.  We decided to cautiously continue primidone.  He may need to increased to 1 po bid if the decrease in the klonopin results in more tremor.  Risks, benefits, side effects and alternative therapies were discussed.  The opportunity to ask questions was given and they were answered to the best of my ability.  The patient expressed understanding and willingness to follow the outlined treatment protocols. 4.  F/u in 6 months, sooner should new neuro issues arise.

## 2013-04-15 ENCOUNTER — Encounter: Payer: Self-pay | Admitting: Radiation Oncology

## 2013-04-16 ENCOUNTER — Ambulatory Visit (HOSPITAL_COMMUNITY)
Admission: RE | Admit: 2013-04-16 | Discharge: 2013-04-16 | Disposition: A | Discharge status: Home / Self Care | Source: Ambulatory Visit | Attending: Radiation Oncology | Admitting: Radiation Oncology

## 2013-04-16 DIAGNOSIS — I2510 Atherosclerotic heart disease of native coronary artery without angina pectoris: Secondary | ICD-10-CM | POA: Insufficient documentation

## 2013-04-16 DIAGNOSIS — C61 Malignant neoplasm of prostate: Secondary | ICD-10-CM

## 2013-04-16 DIAGNOSIS — C3490 Malignant neoplasm of unspecified part of unspecified bronchus or lung: Secondary | ICD-10-CM | POA: Insufficient documentation

## 2013-04-16 DIAGNOSIS — D7389 Other diseases of spleen: Secondary | ICD-10-CM | POA: Insufficient documentation

## 2013-04-16 DIAGNOSIS — N200 Calculus of kidney: Secondary | ICD-10-CM | POA: Insufficient documentation

## 2013-04-16 DIAGNOSIS — K769 Liver disease, unspecified: Secondary | ICD-10-CM | POA: Insufficient documentation

## 2013-04-16 DIAGNOSIS — M948X9 Other specified disorders of cartilage, unspecified sites: Secondary | ICD-10-CM | POA: Insufficient documentation

## 2013-04-16 DIAGNOSIS — I7789 Other specified disorders of arteries and arterioles: Secondary | ICD-10-CM | POA: Insufficient documentation

## 2013-04-16 DIAGNOSIS — Z923 Personal history of irradiation: Secondary | ICD-10-CM | POA: Insufficient documentation

## 2013-04-16 DIAGNOSIS — J90 Pleural effusion, not elsewhere classified: Secondary | ICD-10-CM | POA: Insufficient documentation

## 2013-04-16 DIAGNOSIS — R599 Enlarged lymph nodes, unspecified: Secondary | ICD-10-CM | POA: Insufficient documentation

## 2013-04-16 DIAGNOSIS — N289 Disorder of kidney and ureter, unspecified: Secondary | ICD-10-CM | POA: Insufficient documentation

## 2013-04-16 DIAGNOSIS — Z8546 Personal history of malignant neoplasm of prostate: Secondary | ICD-10-CM | POA: Insufficient documentation

## 2013-04-16 DIAGNOSIS — I700 Atherosclerosis of aorta: Secondary | ICD-10-CM | POA: Insufficient documentation

## 2013-04-17 ENCOUNTER — Ambulatory Visit: Admission: RE | Admit: 2013-04-17 | Payer: Medicare Other | Source: Ambulatory Visit | Admitting: Radiation Oncology

## 2013-04-22 ENCOUNTER — Telehealth: Payer: Self-pay | Admitting: Radiation Oncology

## 2013-04-22 NOTE — Telephone Encounter (Signed)
Lupita Leash, RN for [facility] phoned today requesting CT results be faxed to [phone] (to be placed in Hospice chart). Also, she reports the patient's son in law, [name], is questioning the results. She reports he was suppose to present to the follow up appointment but, got "wrapped up at the office." She reports he is requesting a call from the doctor at [phone]. Explained to Lupita Leash this Clinical research associate would inform Dr. Kathrynn Running of these request and contact her back if he allowed results to be faxed.

## 2013-04-25 NOTE — Telephone Encounter (Signed)
Called son.

## 2013-05-02 ENCOUNTER — Telehealth: Payer: Self-pay | Admitting: Radiation Oncology

## 2013-05-02 NOTE — Telephone Encounter (Signed)
Faxed copy of 04/16/2013 chest CT to Oswaldo Conroy, RN for [facility] to put in their records. Confirmation fax of delivery obtained.

## 2013-06-06 ENCOUNTER — Encounter: Payer: Self-pay | Admitting: Internal Medicine

## 2013-06-06 NOTE — Telephone Encounter (Signed)
Roy [name] to see above 

## 2013-06-11 ENCOUNTER — Ambulatory Visit: Payer: Medicare Other | Admitting: Internal Medicine

## 2013-07-03 ENCOUNTER — Other Ambulatory Visit: Payer: Self-pay

## 2013-07-08 ENCOUNTER — Ambulatory Visit: Admitting: Emergency Medicine

## 2013-08-06 IMAGING — CR DG WRIST COMPLETE 3+V*L*
2 series · 2 of 2 positions shown · non-contrast
Comparison: None.

CLINICAL DATA: Left wrist pain following a fall on 04/13/2012.

LEFT WRIST - COMPLETE 3+ VIEW

[view not recorded (1 of 2)]
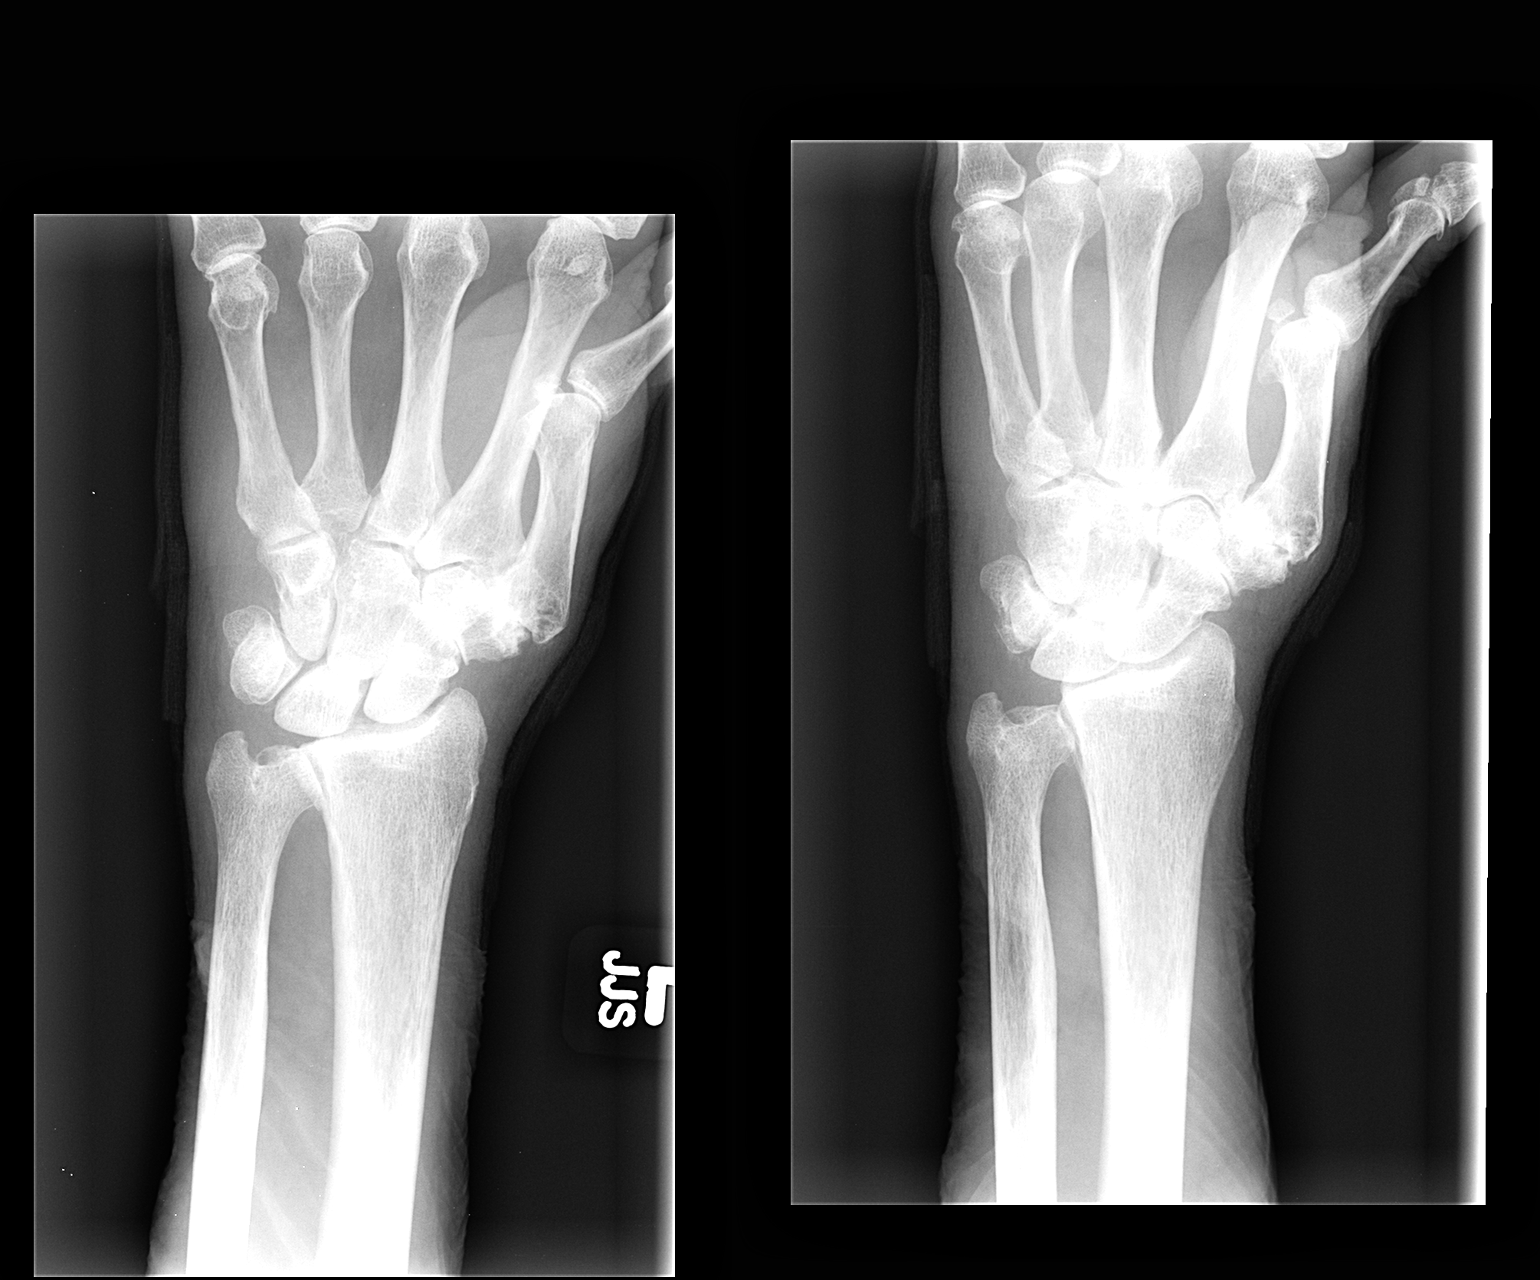

[view not recorded (2 of 2)]
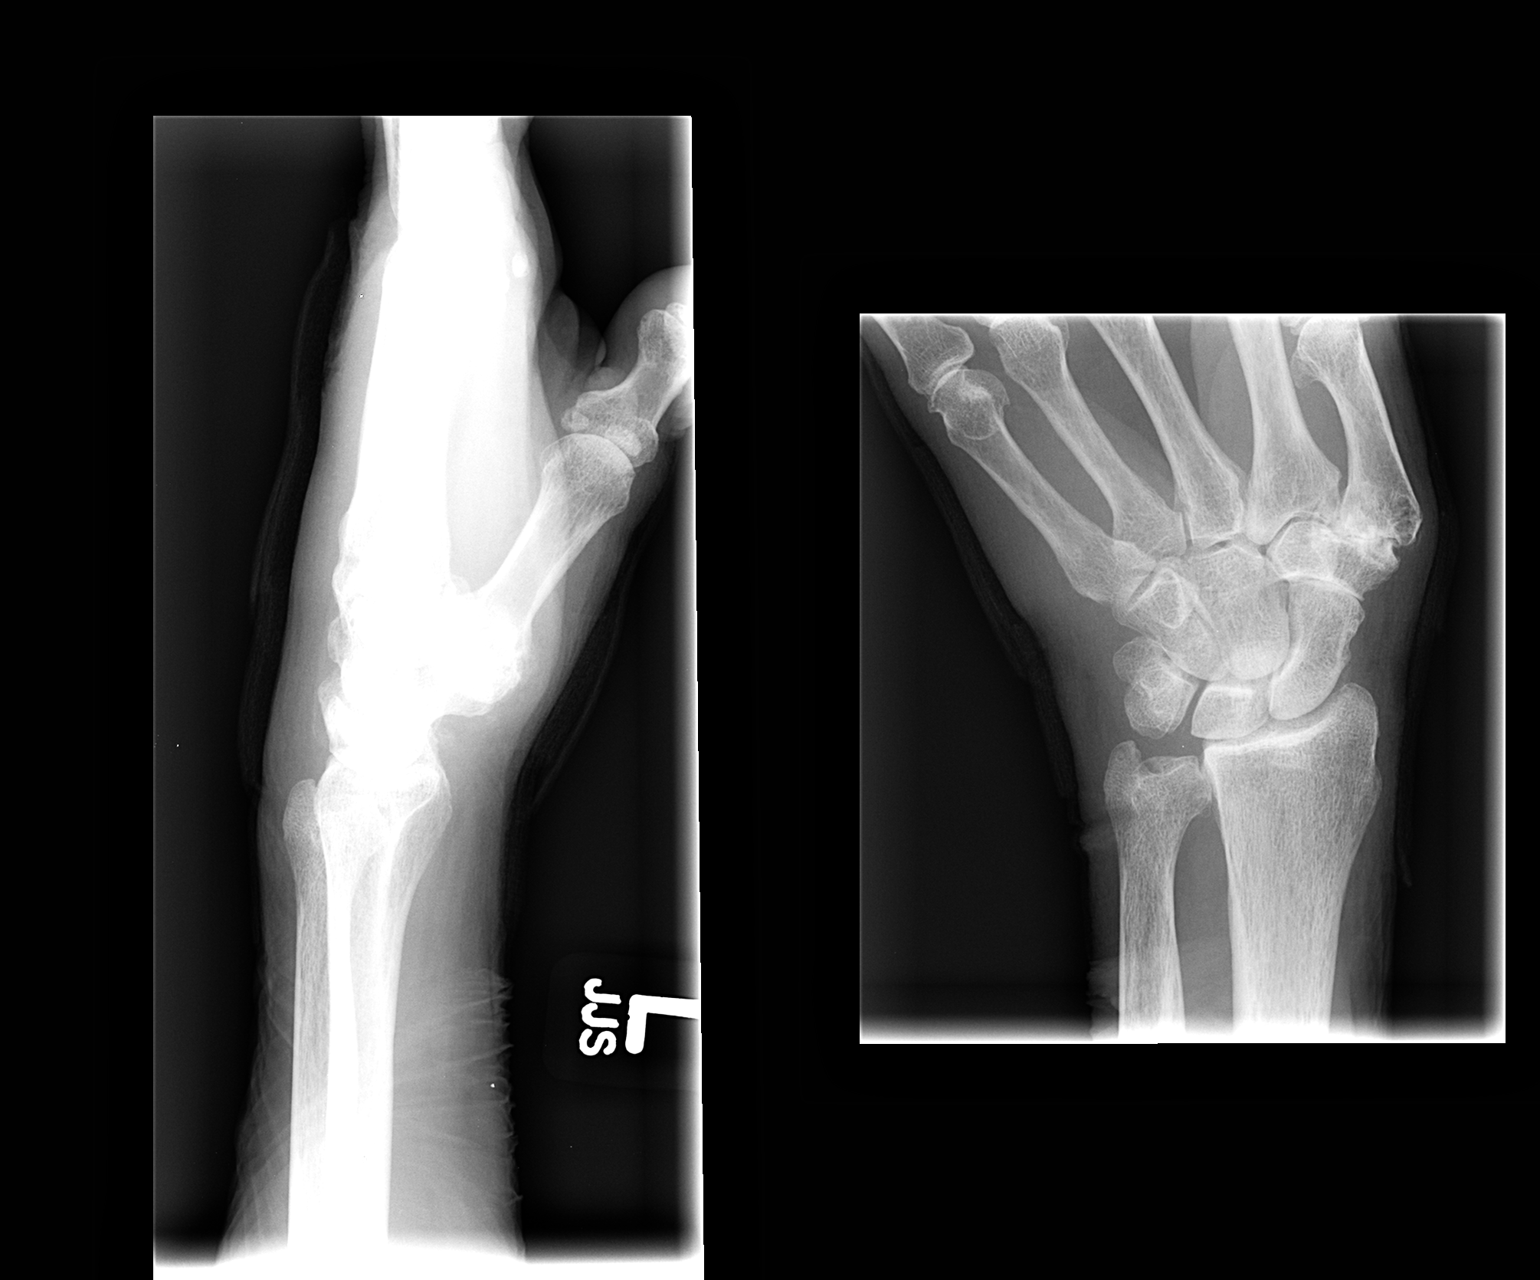

[2 of 2 positions shown; findings below may reference images not displayed]

FINDINGS: Degenerative changes at the articulation of the first
metacarpal and trapezium.  Mild radial ulnar degenerative spur
formation.  No fracture or dislocation seen.
IMPRESSION: 1.  No fracture.
2.  Degenerative changes.

## 2013-08-07 ENCOUNTER — Ambulatory Visit: Admitting: Emergency Medicine

## 2013-08-28 DEATH — deceased

## 2013-08-29 ENCOUNTER — Telehealth: Payer: Self-pay | Admitting: Neurology

## 2013-08-29 NOTE — Telephone Encounter (Signed)
Called to confirm pt appt. Per pt's daughter, pt passed away 09/10/2013. Appt cancelled and sympathy card sent to the family / Sherri S.

## 2013-09-01 ENCOUNTER — Ambulatory Visit: Payer: Medicare Other | Admitting: Neurology

## 2013-12-15 IMAGING — CR DG CHEST 1V PORT
1 series · 1 of 1 positions shown · non-contrast
Comparison: Chest radiograph 08/23/2012

CLINICAL DATA: Short of breath

PORTABLE CHEST - 1 VIEW

[AP]
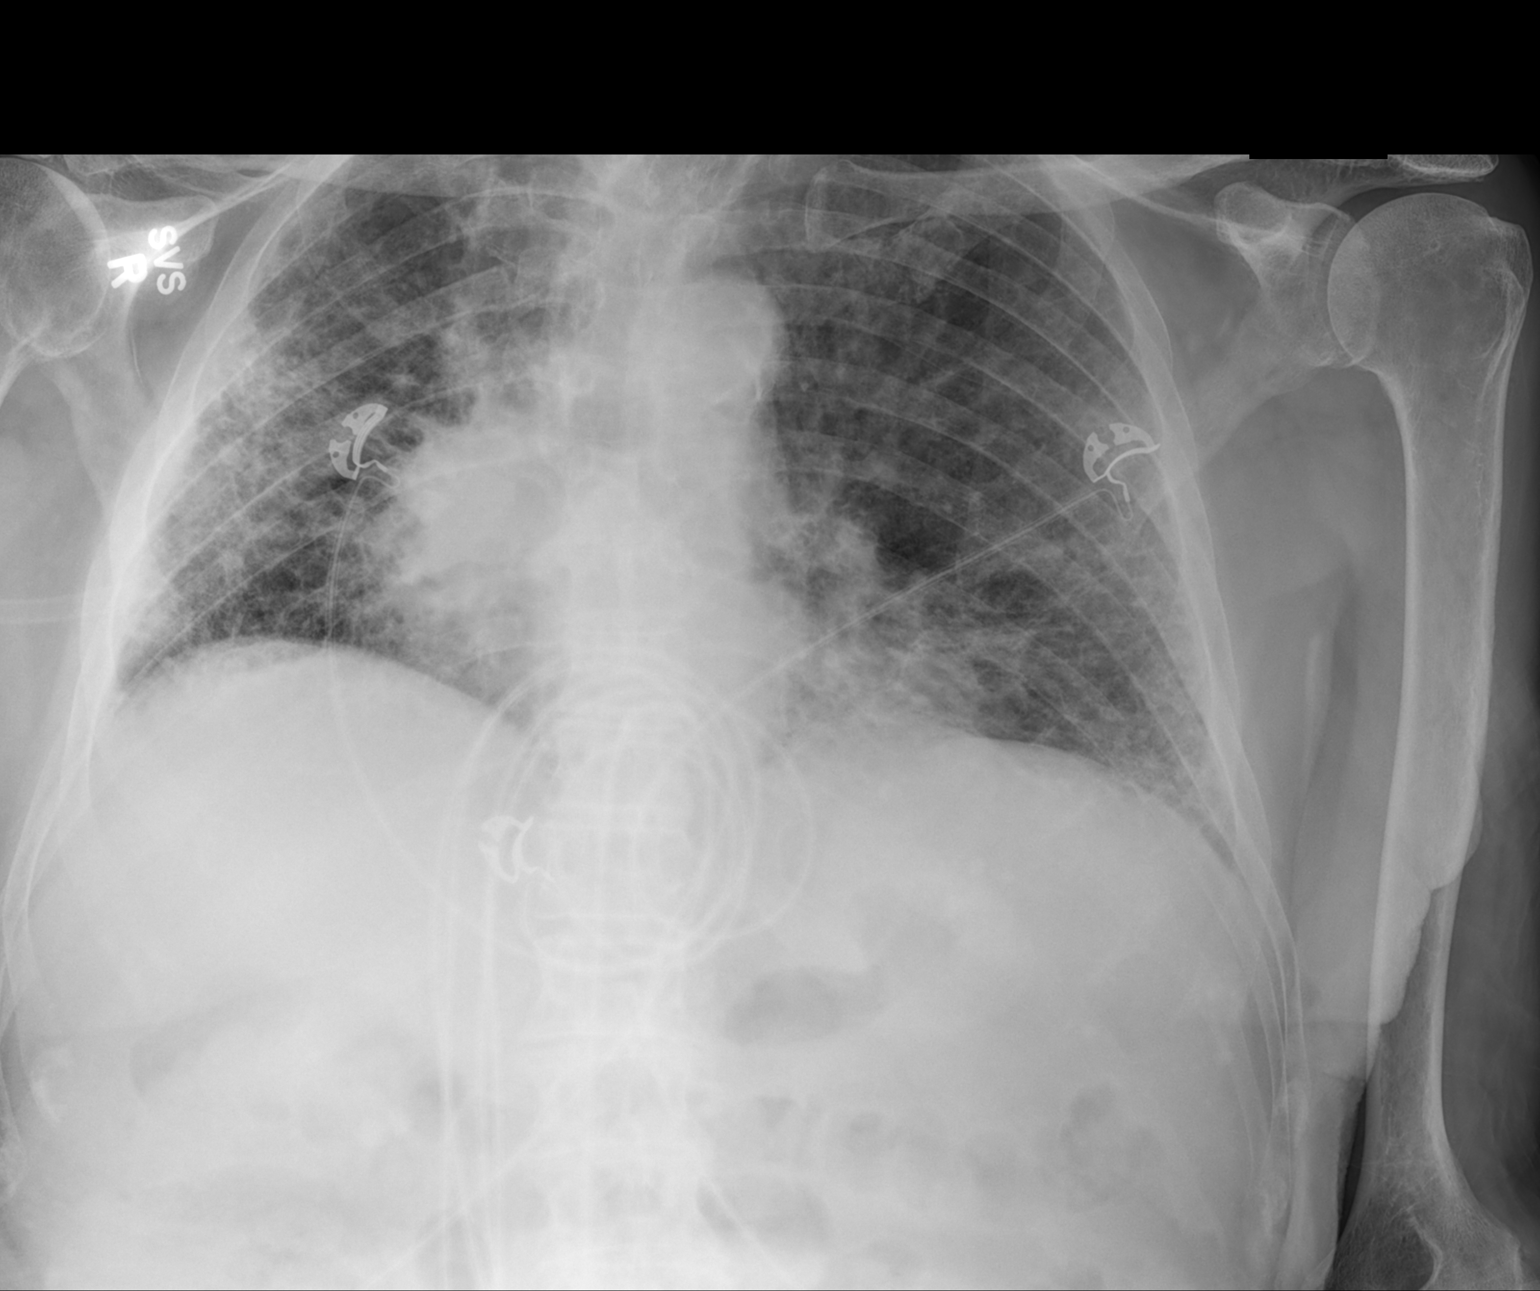

[1 of 1 positions shown; findings below may reference images not displayed]

FINDINGS: Normal cardiac silhouette.  There is a dense right
perihilar opacity again demonstrated.  There this represents a
hypermetabolic mass on comparison PET CT scan of the 03/25/2012.
There is a post obstructive pneumonitis in the right lower lobe
which is similar to prior.  There is underlying chronic
interstitial lung disease which is not changed from prior.  No
pneumothorax.
IMPRESSION: 1..  No significant interval change.
2.  Right perihilar mass consistent with bronchogenic carcinoma.
3.  Mild postobstructive pneumonitis in the right lung.
4.  Chronic interstitial lung disease.

## 2013-12-18 IMAGING — CT CT HEAD W/O CM
2 series · 16 of 30 positions shown, 19 images · non-contrast
Comparison: CT of the head performed 07/04/2012

CLINICAL DATA: Status post fall; hit head on counter.  Right
periorbital swelling.

CT HEAD WITHOUT CONTRAST
TECHNIQUE: Contiguous axial images were obtained from the base of
the skull through the vertex without contrast.

[Series 2: head w/o · axial · non-contrast · 0.45mm/px · z∈[-57,+73]mm · 9 of 34 slices shown, 12 images]
[im 4/34  brain]
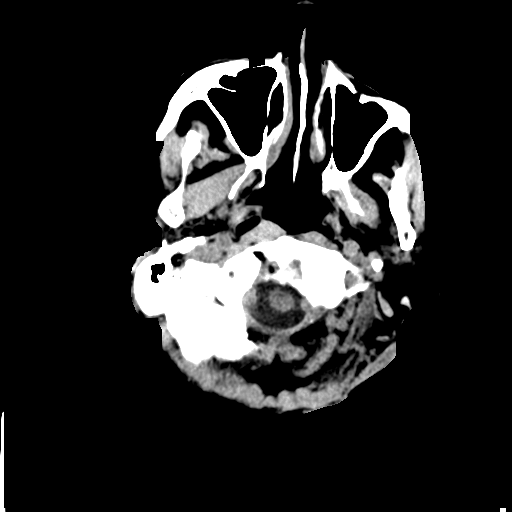
[im 4/34  bone]
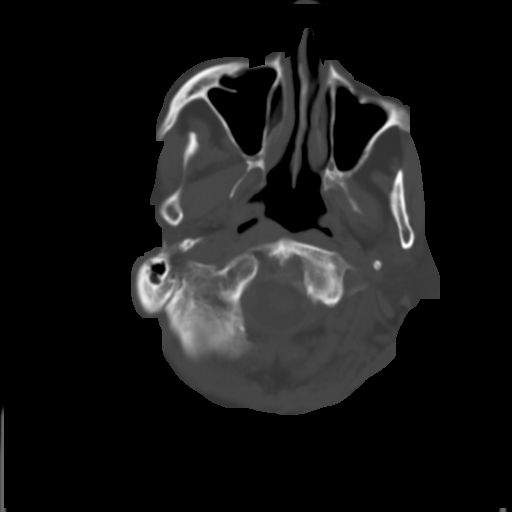
[im 7/34  brain]
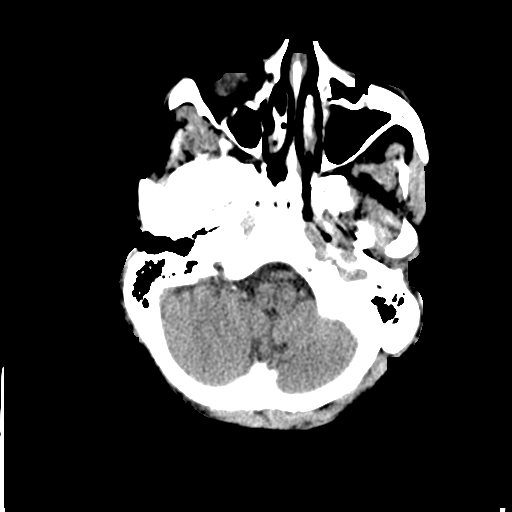
[im 10/34  brain]
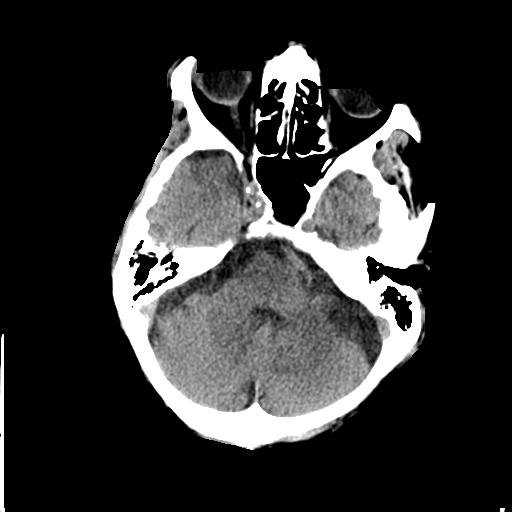
[im 14/34  brain]
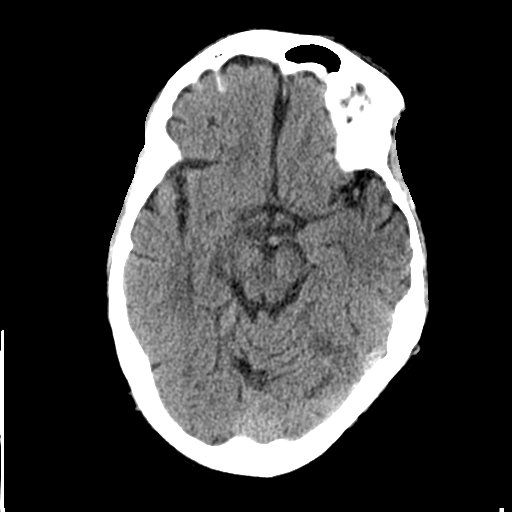
[im 17/34  brain]
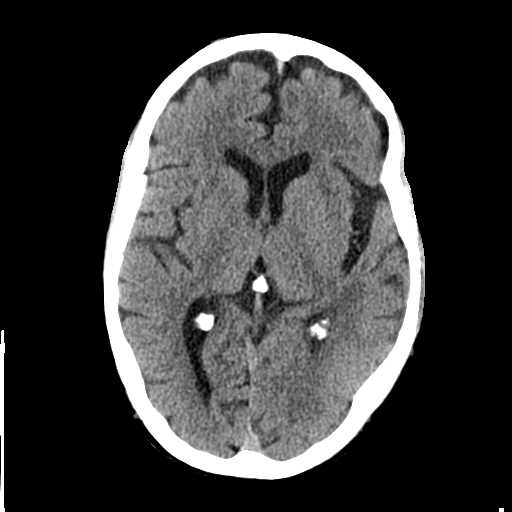
[im 17/34  bone]
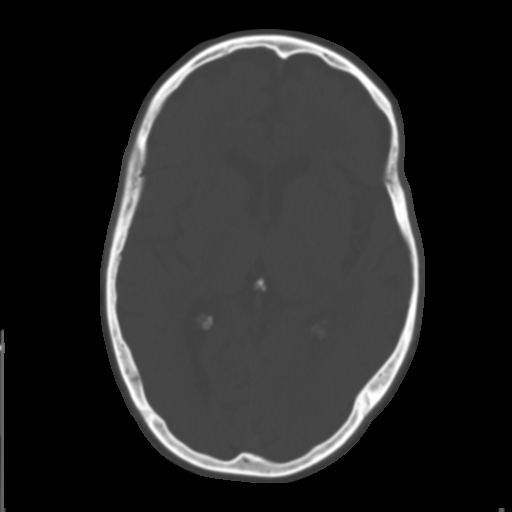
[im 20/34  brain]
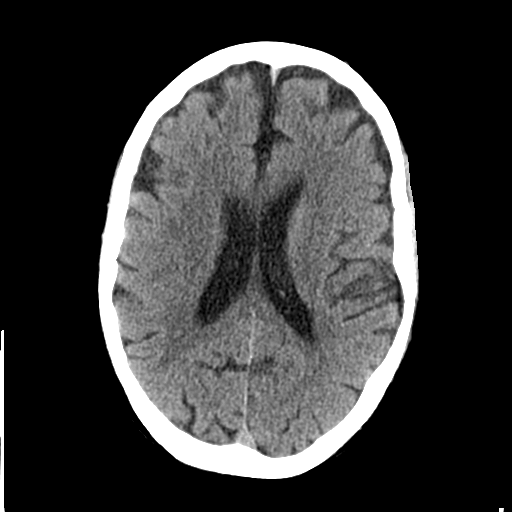
[im 24/34  brain]
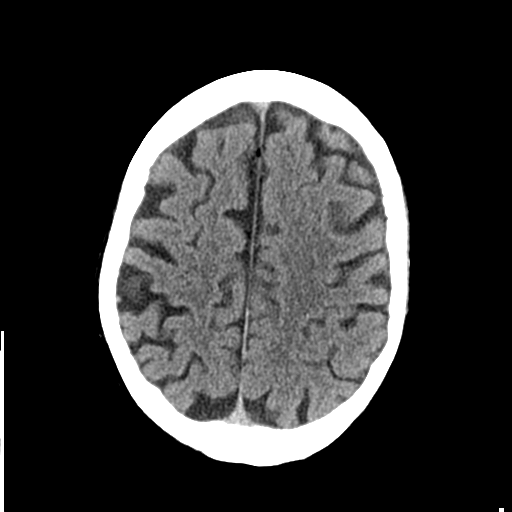
[im 27/34  brain]
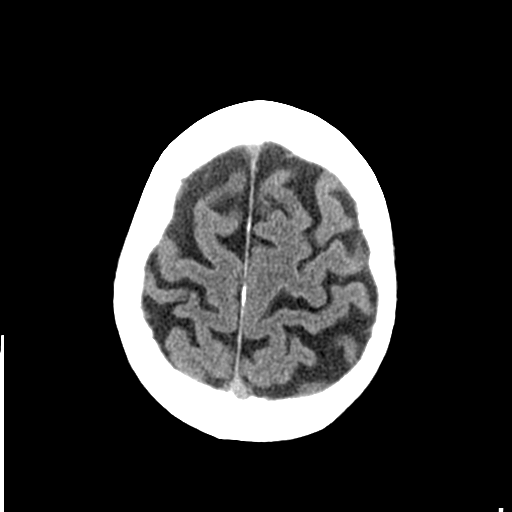
[im 30/34  brain]
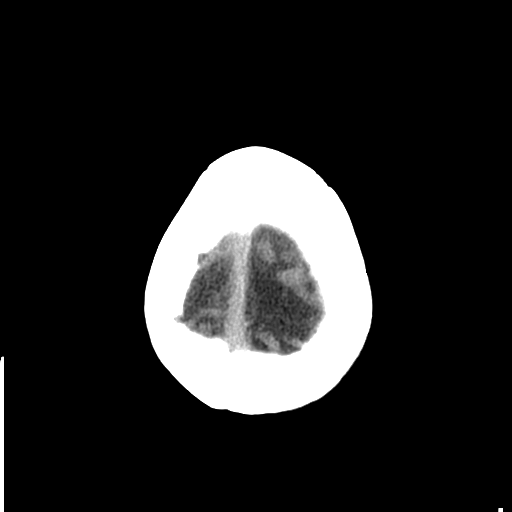
[im 30/34  bone]
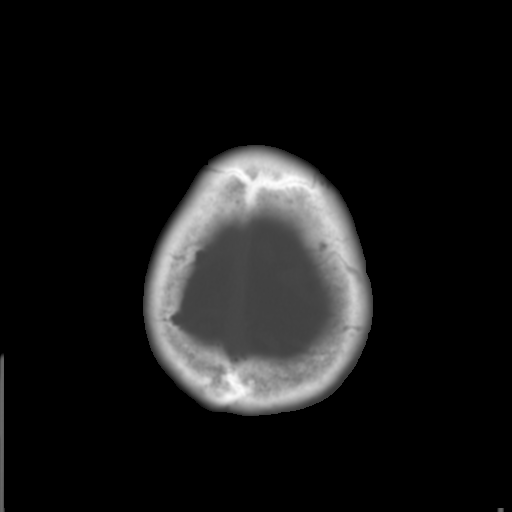

[Series 3: bone windows · axial · 0.45mm/px · z∈[-54,+54]mm · 7 of 56 slices shown]
[im 7/56  bone]
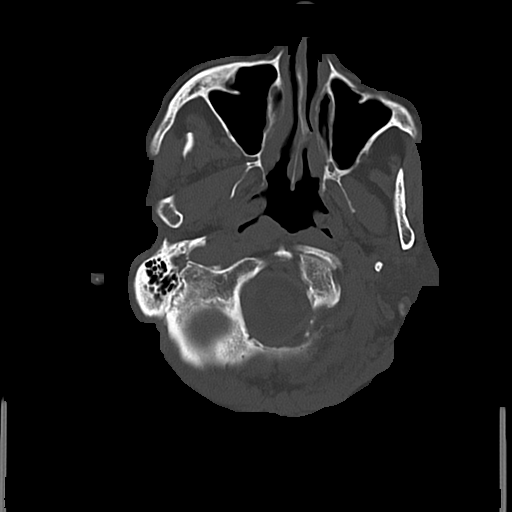
[im 13/56  bone]
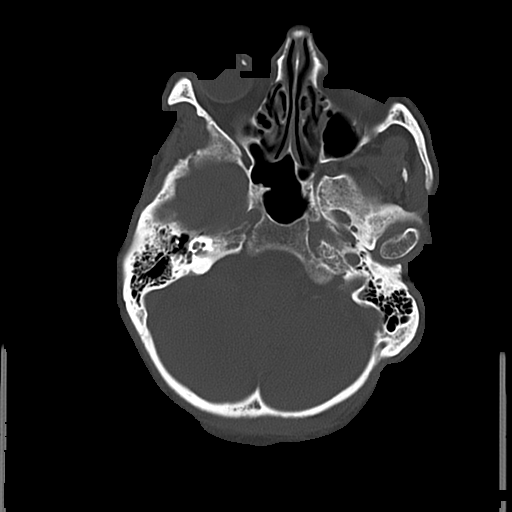
[im 19/56  bone]
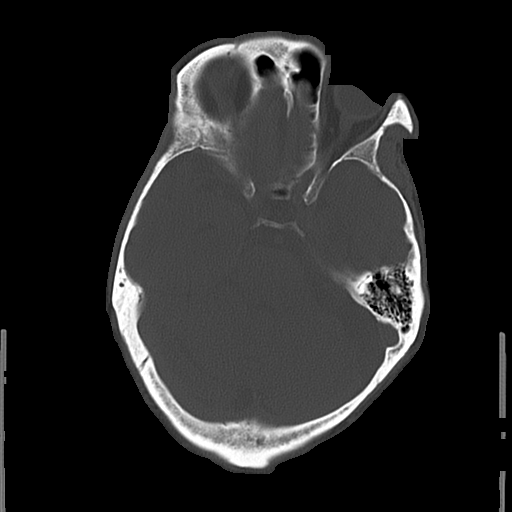
[im 25/56  bone]
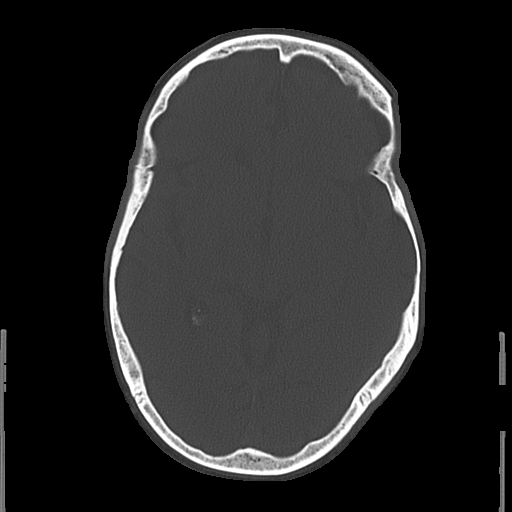
[im 31/56  bone]
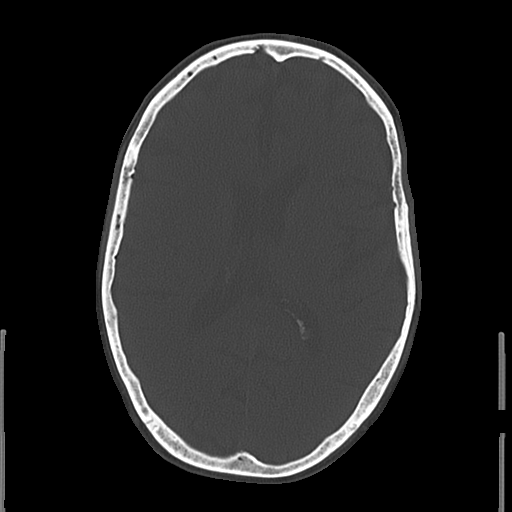
[im 37/56  bone]
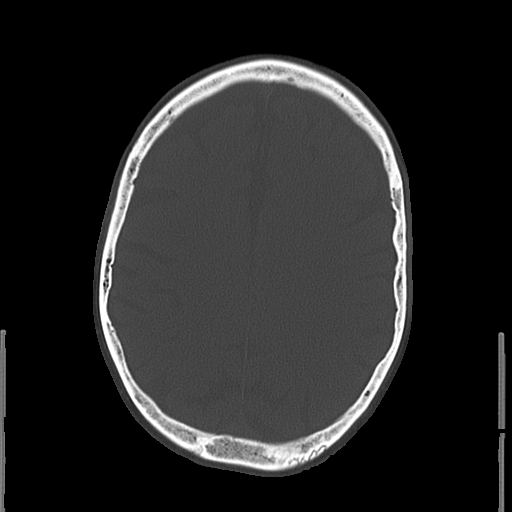
[im 43/56  bone]
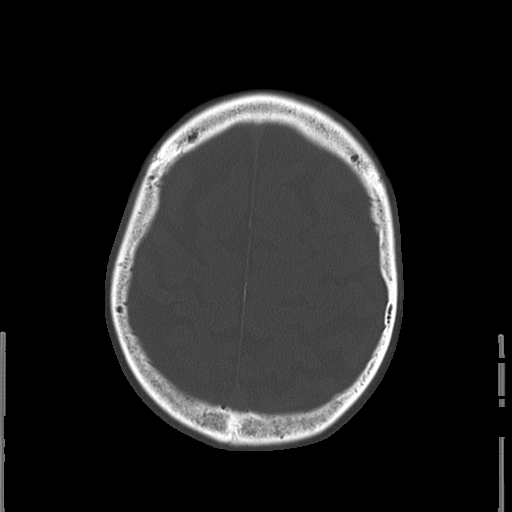

[16 of 30 positions shown; findings below may reference images not displayed]

FINDINGS: There is no evidence of acute infarction, mass lesion, or
intra- or extra-axial hemorrhage on CT.

Prominence of the sulci reflects mild cortical volume loss.  Mild
cerebellar atrophy is noted.

The brainstem and fourth ventricle are within normal limits.  The
basal ganglia are unremarkable in appearance.  The cerebral
hemispheres demonstrate grossly normal gray-white differentiation.
No mass effect or midline shift is seen.

There is suggestion of a minimally displaced fracture through the
anterior and lateral walls of the inferior right maxillary sinus,
with trace blood in the right maxillary sinus.  The orbits are
within normal limits; no definite orbital floor fracture is seen.
The paranasal sinuses and mastoid air cells are well-aerated.  No
significant soft tissue abnormalities are seen.
IMPRESSION: 1.  No evidence of traumatic intracranial injury.
2.  Likely minimally displaced fracture through the anterior and
lateral walls of the inferior right maxillary sinus, with trace
blood in the right maxillary sinus.  No evidence of orbital floor
fracture.
3.  Mild cortical volume loss noted.
